# Patient Record
Sex: Female | Born: 1935
Health system: Southern US, Community
[De-identification: ages and names within clinical notes are randomized; demographics above are authoritative.]

## PROBLEM LIST (undated history)

## (undated) DIAGNOSIS — I351 Nonrheumatic aortic (valve) insufficiency: Secondary | ICD-10-CM

## (undated) DIAGNOSIS — I340 Nonrheumatic mitral (valve) insufficiency: Secondary | ICD-10-CM

## (undated) DIAGNOSIS — S129XXA Fracture of neck, unspecified, initial encounter: Secondary | ICD-10-CM

## (undated) DIAGNOSIS — I071 Rheumatic tricuspid insufficiency: Secondary | ICD-10-CM

## (undated) DIAGNOSIS — I10 Essential (primary) hypertension: Secondary | ICD-10-CM

## (undated) DIAGNOSIS — I4891 Unspecified atrial fibrillation: Secondary | ICD-10-CM

## (undated) DIAGNOSIS — R011 Cardiac murmur, unspecified: Secondary | ICD-10-CM

## (undated) DIAGNOSIS — R350 Frequency of micturition: Secondary | ICD-10-CM

## (undated) DIAGNOSIS — I719 Aortic aneurysm of unspecified site, without rupture: Secondary | ICD-10-CM

## (undated) DIAGNOSIS — I509 Heart failure, unspecified: Secondary | ICD-10-CM

## (undated) DIAGNOSIS — M1990 Unspecified osteoarthritis, unspecified site: Secondary | ICD-10-CM

## (undated) DIAGNOSIS — I499 Cardiac arrhythmia, unspecified: Secondary | ICD-10-CM

## (undated) DIAGNOSIS — K219 Gastro-esophageal reflux disease without esophagitis: Secondary | ICD-10-CM

## (undated) DIAGNOSIS — Z973 Presence of spectacles and contact lenses: Secondary | ICD-10-CM

## (undated) DIAGNOSIS — R0602 Shortness of breath: Secondary | ICD-10-CM

## (undated) DIAGNOSIS — D649 Anemia, unspecified: Secondary | ICD-10-CM

## (undated) DIAGNOSIS — Z8701 Personal history of pneumonia (recurrent): Secondary | ICD-10-CM

## (undated) DIAGNOSIS — I2510 Atherosclerotic heart disease of native coronary artery without angina pectoris: Secondary | ICD-10-CM

## (undated) DIAGNOSIS — Z8709 Personal history of other diseases of the respiratory system: Secondary | ICD-10-CM

## (undated) DIAGNOSIS — J449 Chronic obstructive pulmonary disease, unspecified: Secondary | ICD-10-CM

## (undated) DIAGNOSIS — R32 Unspecified urinary incontinence: Secondary | ICD-10-CM

## (undated) DIAGNOSIS — F40240 Claustrophobia: Secondary | ICD-10-CM

## (undated) DIAGNOSIS — S42009A Fracture of unspecified part of unspecified clavicle, initial encounter for closed fracture: Secondary | ICD-10-CM

## (undated) HISTORY — PX: TONSILLECTOMY: SUR1361

## (undated) HISTORY — DX: Heart failure, unspecified: I50.9

## (undated) HISTORY — DX: Nonrheumatic mitral (valve) insufficiency: I34.0

## (undated) HISTORY — DX: Nonrheumatic aortic (valve) insufficiency: I35.1

## (undated) HISTORY — DX: Unspecified atrial fibrillation: I48.91

## (undated) HISTORY — PX: CYSTECTOMY: SUR359

## (undated) HISTORY — DX: Aortic aneurysm of unspecified site, without rupture: I71.9

## (undated) HISTORY — DX: Atherosclerotic heart disease of native coronary artery without angina pectoris: I25.10

## (undated) HISTORY — DX: Rheumatic tricuspid insufficiency: I07.1

## (undated) HISTORY — DX: Essential (primary) hypertension: I10

## (undated) HISTORY — PX: CARDIAC CATHETERIZATION: SHX172

---

## 2000-01-01 ENCOUNTER — Encounter: Admission: RE | Admit: 2000-01-01 | Discharge: 2000-01-01 | Payer: Self-pay | Admitting: Family Medicine

## 2000-01-01 ENCOUNTER — Encounter: Payer: Self-pay | Admitting: Family Medicine

## 2001-02-03 ENCOUNTER — Encounter: Payer: Self-pay | Admitting: Family Medicine

## 2001-02-03 ENCOUNTER — Encounter: Admission: RE | Admit: 2001-02-03 | Discharge: 2001-02-03 | Payer: Self-pay | Admitting: Family Medicine

## 2001-12-22 ENCOUNTER — Ambulatory Visit (HOSPITAL_COMMUNITY): Admission: RE | Admit: 2001-12-22 | Discharge: 2001-12-22 | Payer: Self-pay | Admitting: Gastroenterology

## 2002-02-10 ENCOUNTER — Encounter: Payer: Self-pay | Admitting: Family Medicine

## 2002-02-10 ENCOUNTER — Encounter: Admission: RE | Admit: 2002-02-10 | Discharge: 2002-02-10 | Payer: Self-pay | Admitting: Family Medicine

## 2003-02-12 ENCOUNTER — Encounter: Admission: RE | Admit: 2003-02-12 | Discharge: 2003-02-12 | Payer: Self-pay | Admitting: Family Medicine

## 2003-02-12 ENCOUNTER — Encounter: Payer: Self-pay | Admitting: Family Medicine

## 2004-02-14 ENCOUNTER — Encounter: Admission: RE | Admit: 2004-02-14 | Discharge: 2004-02-14 | Payer: Self-pay | Admitting: Family Medicine

## 2005-02-19 ENCOUNTER — Encounter: Admission: RE | Admit: 2005-02-19 | Discharge: 2005-02-19 | Payer: Self-pay | Admitting: Family Medicine

## 2006-04-29 ENCOUNTER — Encounter: Admission: RE | Admit: 2006-04-29 | Discharge: 2006-04-29 | Payer: Self-pay | Admitting: Family Medicine

## 2006-05-01 ENCOUNTER — Encounter: Payer: Self-pay | Admitting: Internal Medicine

## 2006-05-13 ENCOUNTER — Encounter: Payer: Self-pay | Admitting: Internal Medicine

## 2007-05-16 ENCOUNTER — Encounter: Admission: RE | Admit: 2007-05-16 | Discharge: 2007-05-16 | Payer: Self-pay | Admitting: Family Medicine

## 2007-06-25 ENCOUNTER — Encounter: Admission: RE | Admit: 2007-06-25 | Discharge: 2007-06-25 | Payer: Self-pay | Admitting: Cardiovascular Disease

## 2007-08-12 ENCOUNTER — Ambulatory Visit: Payer: Self-pay | Admitting: Family Medicine

## 2007-11-07 ENCOUNTER — Ambulatory Visit (HOSPITAL_COMMUNITY): Admission: RE | Admit: 2007-11-07 | Discharge: 2007-11-07 | Payer: Self-pay | Admitting: Cardiovascular Disease

## 2008-05-19 ENCOUNTER — Encounter: Admission: RE | Admit: 2008-05-19 | Discharge: 2008-05-19 | Payer: Self-pay | Admitting: Family Medicine

## 2008-09-20 ENCOUNTER — Ambulatory Visit (HOSPITAL_COMMUNITY): Admission: RE | Admit: 2008-09-20 | Discharge: 2008-09-20 | Payer: Self-pay | Admitting: Cardiovascular Disease

## 2008-11-05 ENCOUNTER — Encounter: Payer: Self-pay | Admitting: Internal Medicine

## 2008-11-19 ENCOUNTER — Encounter: Payer: Self-pay | Admitting: Internal Medicine

## 2008-12-03 ENCOUNTER — Ambulatory Visit: Payer: Self-pay | Admitting: Internal Medicine

## 2008-12-15 ENCOUNTER — Encounter: Payer: Self-pay | Admitting: Internal Medicine

## 2009-01-04 ENCOUNTER — Ambulatory Visit: Payer: Self-pay | Admitting: Internal Medicine

## 2009-01-04 ENCOUNTER — Inpatient Hospital Stay (HOSPITAL_COMMUNITY): Admission: AD | Admit: 2009-01-04 | Discharge: 2009-01-09 | Payer: Self-pay | Admitting: Cardiovascular Disease

## 2009-02-02 ENCOUNTER — Encounter: Payer: Self-pay | Admitting: Internal Medicine

## 2009-02-16 ENCOUNTER — Encounter: Payer: Self-pay | Admitting: Internal Medicine

## 2009-02-23 ENCOUNTER — Ambulatory Visit: Payer: Self-pay | Admitting: Internal Medicine

## 2009-03-02 ENCOUNTER — Ambulatory Visit (HOSPITAL_COMMUNITY): Admission: RE | Admit: 2009-03-02 | Discharge: 2009-03-02 | Payer: Self-pay | Admitting: Cardiovascular Disease

## 2009-03-16 ENCOUNTER — Encounter: Payer: Self-pay | Admitting: Internal Medicine

## 2009-03-23 ENCOUNTER — Telehealth (INDEPENDENT_AMBULATORY_CARE_PROVIDER_SITE_OTHER): Payer: Self-pay | Admitting: *Deleted

## 2009-04-04 ENCOUNTER — Ambulatory Visit: Payer: Self-pay | Admitting: Internal Medicine

## 2009-04-11 ENCOUNTER — Encounter: Payer: Self-pay | Admitting: Internal Medicine

## 2009-04-11 ENCOUNTER — Telehealth: Payer: Self-pay | Admitting: Internal Medicine

## 2009-04-28 ENCOUNTER — Ambulatory Visit: Payer: Self-pay | Admitting: Internal Medicine

## 2009-05-16 ENCOUNTER — Encounter: Admission: RE | Admit: 2009-05-16 | Discharge: 2009-05-16 | Payer: Self-pay | Admitting: Family Medicine

## 2009-05-16 ENCOUNTER — Ambulatory Visit: Payer: Self-pay | Admitting: Family Medicine

## 2009-06-14 ENCOUNTER — Encounter: Admission: RE | Admit: 2009-06-14 | Discharge: 2009-06-14 | Payer: Self-pay | Admitting: Family Medicine

## 2009-06-27 ENCOUNTER — Ambulatory Visit: Payer: Self-pay | Admitting: Family Medicine

## 2009-07-19 ENCOUNTER — Ambulatory Visit: Payer: Self-pay | Admitting: Family Medicine

## 2009-07-25 ENCOUNTER — Encounter
Admission: RE | Admit: 2009-07-25 | Discharge: 2009-09-19 | Payer: Self-pay | Source: Home / Self Care | Admitting: Family Medicine

## 2009-12-13 ENCOUNTER — Ambulatory Visit: Payer: Self-pay | Admitting: Family Medicine

## 2010-02-17 ENCOUNTER — Ambulatory Visit: Payer: Self-pay | Admitting: Cardiology

## 2010-03-17 ENCOUNTER — Ambulatory Visit: Payer: Self-pay | Admitting: Cardiovascular Disease

## 2010-04-14 ENCOUNTER — Ambulatory Visit: Payer: Self-pay | Admitting: Cardiovascular Disease

## 2010-05-12 ENCOUNTER — Ambulatory Visit: Payer: Self-pay | Admitting: Cardiovascular Disease

## 2010-06-14 ENCOUNTER — Ambulatory Visit: Payer: Self-pay | Admitting: Cardiovascular Disease

## 2010-06-14 ENCOUNTER — Encounter: Admission: RE | Admit: 2010-06-14 | Discharge: 2010-06-14 | Payer: Self-pay | Admitting: Family Medicine

## 2010-07-03 ENCOUNTER — Ambulatory Visit: Payer: Self-pay | Admitting: Cardiovascular Disease

## 2010-07-20 ENCOUNTER — Ambulatory Visit
Admission: RE | Admit: 2010-07-20 | Discharge: 2010-07-20 | Payer: Self-pay | Source: Home / Self Care | Attending: Family Medicine | Admitting: Family Medicine

## 2010-07-24 ENCOUNTER — Ambulatory Visit
Admission: RE | Admit: 2010-07-24 | Discharge: 2010-07-24 | Payer: Self-pay | Source: Home / Self Care | Attending: Family Medicine | Admitting: Family Medicine

## 2010-08-02 ENCOUNTER — Ambulatory Visit: Payer: Self-pay | Admitting: Cardiology

## 2010-08-08 ENCOUNTER — Ambulatory Visit: Payer: Self-pay | Admitting: Cardiovascular Disease

## 2010-08-31 ENCOUNTER — Encounter (INDEPENDENT_AMBULATORY_CARE_PROVIDER_SITE_OTHER): Payer: Medicare Other

## 2010-08-31 DIAGNOSIS — I4891 Unspecified atrial fibrillation: Secondary | ICD-10-CM

## 2010-08-31 DIAGNOSIS — Z7901 Long term (current) use of anticoagulants: Secondary | ICD-10-CM

## 2010-09-05 ENCOUNTER — Ambulatory Visit (INDEPENDENT_AMBULATORY_CARE_PROVIDER_SITE_OTHER): Payer: Medicare Other | Admitting: Family Medicine

## 2010-09-05 DIAGNOSIS — M25569 Pain in unspecified knee: Secondary | ICD-10-CM

## 2010-09-05 DIAGNOSIS — N760 Acute vaginitis: Secondary | ICD-10-CM

## 2010-09-19 ENCOUNTER — Ambulatory Visit (INDEPENDENT_AMBULATORY_CARE_PROVIDER_SITE_OTHER): Payer: Medicare Other | Admitting: Family Medicine

## 2010-09-19 DIAGNOSIS — M25569 Pain in unspecified knee: Secondary | ICD-10-CM

## 2010-09-19 DIAGNOSIS — N760 Acute vaginitis: Secondary | ICD-10-CM

## 2010-10-03 ENCOUNTER — Telehealth: Payer: Self-pay | Admitting: *Deleted

## 2010-10-03 NOTE — Telephone Encounter (Signed)
Instructed pt to hold Lipitor to see if cramps resolve.   She is coming Friday for a lipid panel and we will re-evaluate at that time.  Pt verbalized an understanding of this.

## 2010-10-03 NOTE — Telephone Encounter (Signed)
I agree

## 2010-10-04 ENCOUNTER — Other Ambulatory Visit: Payer: Self-pay | Admitting: *Deleted

## 2010-10-05 ENCOUNTER — Other Ambulatory Visit: Payer: Medicare Other | Admitting: *Deleted

## 2010-10-05 ENCOUNTER — Other Ambulatory Visit: Payer: Self-pay | Admitting: Cardiovascular Disease

## 2010-10-05 ENCOUNTER — Ambulatory Visit (INDEPENDENT_AMBULATORY_CARE_PROVIDER_SITE_OTHER): Payer: Medicare Other | Admitting: *Deleted

## 2010-10-05 DIAGNOSIS — E7800 Pure hypercholesterolemia, unspecified: Secondary | ICD-10-CM

## 2010-10-05 DIAGNOSIS — I4891 Unspecified atrial fibrillation: Secondary | ICD-10-CM

## 2010-10-05 DIAGNOSIS — Z79899 Other long term (current) drug therapy: Secondary | ICD-10-CM

## 2010-10-06 LAB — COMPREHENSIVE METABOLIC PANEL
Albumin: 4.5 g/dL (ref 3.5–5.2)
CO2: 27 mEq/L (ref 19–32)
Chloride: 101 mEq/L (ref 96–112)
Creat: 0.97 mg/dL (ref 0.40–1.20)
Total Bilirubin: 1 mg/dL (ref 0.3–1.2)
Total Protein: 7.2 g/dL (ref 6.0–8.3)

## 2010-10-06 LAB — LIPID PANEL
Cholesterol: 166 mg/dL (ref 0–200)
LDL Cholesterol: 100 mg/dL — ABNORMAL HIGH (ref 0–99)
Total CHOL/HDL Ratio: 3.1 Ratio
Triglycerides: 67 mg/dL (ref <150)
VLDL: 13 mg/dL (ref 0–40)

## 2010-10-13 ENCOUNTER — Other Ambulatory Visit: Payer: Self-pay | Admitting: *Deleted

## 2010-10-13 NOTE — Telephone Encounter (Signed)
Called pt. To discuss her cholesterol.  She states she does not want to try a new medication at this time.  She requested an appt. To see Dr. Elease Hashimoto to discuss. This was made.

## 2010-10-19 ENCOUNTER — Ambulatory Visit (INDEPENDENT_AMBULATORY_CARE_PROVIDER_SITE_OTHER): Payer: Medicare Other | Admitting: Family Medicine

## 2010-10-19 DIAGNOSIS — N760 Acute vaginitis: Secondary | ICD-10-CM

## 2010-10-23 LAB — CBC
HCT: 44.8 % (ref 36.0–46.0)
MCV: 98.6 fL (ref 78.0–100.0)
RBC: 4.54 MIL/uL (ref 3.87–5.11)
WBC: 8.7 10*3/uL (ref 4.0–10.5)

## 2010-10-23 LAB — COMPREHENSIVE METABOLIC PANEL
BUN: 18 mg/dL (ref 6–23)
GFR calc non Af Amer: 51 mL/min — ABNORMAL LOW (ref >60)
Glucose, Bld: 113 mg/dL — ABNORMAL HIGH (ref 70–99)
Total Bilirubin: 1.3 mg/dL — ABNORMAL HIGH (ref 0.3–1.2)
Total Protein: 6.9 g/dL (ref 6.0–8.3)

## 2010-10-23 LAB — APTT: aPTT: 47 seconds — ABNORMAL HIGH (ref 24–37)

## 2010-10-23 LAB — PROTIME-INR
INR: 2.5 — ABNORMAL HIGH (ref 0.00–1.49)
Prothrombin Time: 28 seconds — ABNORMAL HIGH (ref 11.6–15.2)

## 2010-11-02 ENCOUNTER — Ambulatory Visit (INDEPENDENT_AMBULATORY_CARE_PROVIDER_SITE_OTHER): Payer: Medicare Other | Admitting: *Deleted

## 2010-11-02 DIAGNOSIS — I4891 Unspecified atrial fibrillation: Secondary | ICD-10-CM

## 2010-11-02 LAB — POCT INR: INR: 2

## 2010-11-08 ENCOUNTER — Encounter: Payer: Self-pay | Admitting: Cardiovascular Disease

## 2010-11-08 DIAGNOSIS — I340 Nonrheumatic mitral (valve) insufficiency: Secondary | ICD-10-CM | POA: Insufficient documentation

## 2010-11-08 DIAGNOSIS — I10 Essential (primary) hypertension: Secondary | ICD-10-CM | POA: Insufficient documentation

## 2010-11-08 DIAGNOSIS — I4891 Unspecified atrial fibrillation: Secondary | ICD-10-CM | POA: Insufficient documentation

## 2010-11-08 DIAGNOSIS — I351 Nonrheumatic aortic (valve) insufficiency: Secondary | ICD-10-CM | POA: Insufficient documentation

## 2010-11-08 DIAGNOSIS — I071 Rheumatic tricuspid insufficiency: Secondary | ICD-10-CM | POA: Insufficient documentation

## 2010-11-15 ENCOUNTER — Encounter: Payer: Self-pay | Admitting: Cardiovascular Disease

## 2010-11-15 ENCOUNTER — Ambulatory Visit (INDEPENDENT_AMBULATORY_CARE_PROVIDER_SITE_OTHER): Payer: Medicare Other | Admitting: Cardiovascular Disease

## 2010-11-15 VITALS — BP 138/78 | HR 58 | Ht 64.0 in | Wt 147.0 lb

## 2010-11-15 DIAGNOSIS — I4891 Unspecified atrial fibrillation: Secondary | ICD-10-CM

## 2010-11-15 DIAGNOSIS — E785 Hyperlipidemia, unspecified: Secondary | ICD-10-CM

## 2010-11-15 MED ORDER — DIGOXIN 125 MCG PO TABS: 125.0000 ug | ORAL_TABLET | Freq: Every day | ORAL | Status: DC

## 2010-11-15 MED ORDER — WARFARIN SODIUM 5 MG PO TABS: 5.0000 mg | ORAL_TABLET | Freq: Every day | ORAL | Status: DC

## 2010-11-15 MED ORDER — PRAZOSIN HCL 2 MG PO CAPS: 2.0000 mg | ORAL_CAPSULE | Freq: Two times a day (BID) | ORAL | Status: DC

## 2010-11-15 NOTE — Progress Notes (Signed)
[name] [name] Date of Birth  1931-08-03 [facility] / [facility] [address]     [address] [place], [place]  [address] [phone]  Fax  [phone]  History of Present Illness:  Complains of dyspnea with any exercise.  Gets out of breath with most activities.  Also is limited by her knee pain. She denies having any chest pain. We performed a Lexiscan Myoview study on her in 2010 which was negative for ischemia. She had normal left ventricular systolic function with an ejection fraction of 62%.  Current Outpatient Prescriptions on File Prior to Visit  Medication Sig Dispense Refill  . acetaminophen (TYLENOL) 325 MG tablet Take 650 mg by mouth every 6 (six) hours as needed.        . digoxin (LANOXIN) 0.125 MG tablet Take 125 mcg by mouth daily.        . hydrochlorothiazide 25 MG tablet Take 25 mg by mouth daily.        . potassium chloride (MICRO-K) 10 MEQ CR capsule Take 10 mEq by mouth daily.        . prazosin (MINIPRESS) 2 MG capsule Take 2 mg by mouth 2 (two) times daily.        . propranolol (INDERAL LA) 160 MG SR capsule Take 160 mg by mouth daily.        Marland Kitchen warfarin (COUMADIN) 5 MG tablet Take 5 mg by mouth daily. AS DIRECTED       . DISCONTD: atorvastatin (LIPITOR) 20 MG tablet Take 10 mg by mouth daily.          Allergies  Allergen Reactions  . Atorvastatin     Muscle aches  . Amiodarone Hcl     REACTION: Nausea/Vomiting; decrease appetite; affects liver enzymes  . Aspirin   . Diltiazem Hcl     REACTION: Nausea/vomitting  . Flecainide Acetate     REACTION: Intol: nausea/horrible feeling  . Metoprolol Succinate     REACTION: Nausea/vomiting;dizziness  . Sotalol Hcl     REACTION: Nausea/vomiting    Past Medical History  Diagnosis Date  . AF (atrial fibrillation)   . Hypertension   . Aortic insufficiency   . Mitral regurgitation   . Tricuspid regurgitation     No past surgical history on file.  History  Smoking status  .  Former Smoker  . Quit date: 11/08/1995  Smokeless tobacco  . Not on file    History  Alcohol Use No    Family History  Problem Relation Age of Onset  . Aneurysm Mother 27  . Hodgkin's lymphoma Father 24    Reviw of Systems:  Reviewed in the HPI.  All other systems are negative.  Physical Exam: BP 138/78  Pulse 58  Ht 5\' 4"  (1.626 m)  Wt 147 lb (66.679 kg)  BMI 25.23 kg/m2 The patient is alert and oriented x 3.  The mood and affect are normal.  The skin is warm and dry.  Color is normal.  The HEENT exam reveals that the sclera are nonicteric.  The mucous membranes are moist.  The carotids are 2+ without bruits.  There is no thyromegaly.  There is no JVD.  The lungs are clear.  The chest wall is non tender.  The heart exam reveals an irregular rate with a normal S1 and S2.  There are no murmurs, gallops, or rubs.  The PMI is not displaced.   Abdominal exam reveals good bowel sounds.  There is no  guarding or rebound.  There is no hepatosplenomegaly or tenderness.  There are no masses.  Exam of the legs reveal no clubbing, cyanosis, or edema.  The legs are without rashes.  The distal pulses are intact.  Cranial nerves II - XII are intact.  Motor and sensory functions are intact.  The gait is normal.  Assessment / Plan:

## 2010-11-15 NOTE — Assessment & Plan Note (Signed)
[name] [name] is doing fairly well from a cardiac standpoint. I think that she is deconditioned. She has normal left ventricular systolic function. We'll continue with her same Coumadin dosing. Her INR levels have been therapeutic. I'll see her again in 6 months.

## 2010-11-15 NOTE — Assessment & Plan Note (Signed)
Pt does not want to take Pravachol.  She is concerned about side effects.  Had a bad reaction to Lipator.  Will continue to follow.

## 2010-11-28 NOTE — H&P (Signed)
NAME:  [name] [name], [name] [name] NO.:  [id number]   MEDICAL RECORD NO.:  [id number]          PATIENT TYPE:  INP   LOCATION:                               FACILITY:  [facility]   PHYSICIAN:  Vesta Mixer, M.D. DATE OF BIRTH:  1932/01/11   DATE OF ADMISSION:  DATE OF DISCHARGE:                              HISTORY & PHYSICAL   [name] [name] is an elderly female with a history of aortic insufficiency,  mitral regurgitation, and intermittent atrial fibrillation.  She is now  admitted for initiation of sotalol therapy.   [name] [name] is an elderly female with a history of atrial fibrillation.  We  have tried cardioversion several times.  Her last cardioversion only  lasted a week or so.  We discussed putting her on various  antiarrhythmics.  Unfortunately, her insurance company will only pay for  amiodarone, sotalol, and disopyramide.  We are now admitting her to the  hospital for initiation of sotalol therapy.   She has had a negative stress Cardiolite study in 2007.  She had no  evidence of ischemia, and she had normal left ventricular systolic  function.  Her echocardiogram from 2007 also revealed normal left  ventricular systolic function.  She has mild left ventricular  hypertrophy.  There is mild aortic stenosis with moderate aortic  insufficiency.  She has mild mitral regurgitation and moderate tricuspid  regurgitation.  Her PA pressures were estimated at 34.   [name] [name] has continued to have lots of shortness of breath and generalized  fatigue.  She complains of some leg swelling.  She has not had any  episodes of chest pain.  She denies any syncope or presyncope.  The  shortness of breath seems to be clearly worsened by her atrial  fibrillation.  She was clearly better for the week that she was in  normal sinus rhythm.   She denies any heat or cold intolerance.  She denies any weight gain or  weight loss.  She denies any blood in her urine or blood in her stool.  She has been  tolerating the Coumadin quite well.  She denies any visual  problems.  She denies any skin problems.  She is quite tender from her  exposure to the son.   She denies any change in bowel habits.  She denies any weight gain or  weight loss.   CURRENT MEDICATIONS:  1. Inderal LA 160 mg a day.  2. Hydrochlorothiazide 25 mg a day.  3. Potassium chloride 10 mEq a day.  4. Prazosin 2 mg twice a day.  5. Coumadin 5 mg 3 days a week with 2.5 mg 4 days a week.   She is intolerant to ASPIRIN which caused peptic ulcer disease and  bleeding.   PAST MEDICAL HISTORY:  1. History of peptic ulcer disease.  2. Atrial fibrillation.  3. Moderate aortic insufficiency.  4. Mitral regurgitation.  5. Tricuspid regurgitation.   SOCIAL HISTORY:  The patient is a nonsmoker.  She is retired.   FAMILY HISTORY:  Her father died in his 67s due to Hodgkin disease.  Her  mother lived to be 58 years old died of an aneurysm.   Her review of systems is reviewed in the HPI.  All other systems were  reviewed and are negative.   PHYSICAL EXAMINATION:  GENERAL:  She is an elderly female in no acute  distress.  She is alert and oriented x3, and her mood and affect are  normal.  VITAL SIGNS:  Her weight is 157 which is down 5 pounds.  Blood pressure  is 90/60.  Her heart rate is 106.  NECK:  Supple.  Her carotids are 2+ without bruits.  There is no JVD.  HEENT:  Her sclerae are nonicteric.  Her mucous membranes are moist.  LUNGS:  Clear.  BACK:  Nontender.  HEART:  Irregularly irregular.  She has a 2/6 systolic ejection murmur  at the left sternal border.  Her PMI is nondisplaced.  ABDOMEN:  Good bowel sounds and is nontender.  There is no guarding or  rebound.  There is no hepatosplenomegaly.  EXTREMITIES:  She is quite tan.  There is no palpable cords.  There is  no clubbing, cyanosis, or edema.  NEUROLOGIC:  Cranial nerves II through XII are intact, and motor and  sensory function are intact.  Her gait is  normal.   Laboratory data is pending.   [name] [name] is now admitted to the hospital for initiation of sotalol therapy.  We have discussed the risks, benefits, and options of sotalol  initiation.  We will anticipate doing a cardioversion on Friday.      Vesta Mixer, M.D.  Electronically Signed     PJN/MEDQ  D:  01/04/2009  T:  01/04/2009  Job:  [id number]   cc:   Sharlot Gowda, M.D.

## 2010-11-28 NOTE — H&P (Signed)
NAME:  [name] [name], [name] [name] NO.:  [id number]   MEDICAL RECORD NO.:  [id number]           PATIENT TYPE:  OIB   LOCATION:                               FACILITY:  [facility]   PHYSICIAN:  Vesta Mixer, M.D. DATE OF BIRTH:  04-Dec-1931   DATE OF ADMISSION:  09/20/2008  DATE OF DISCHARGE:                              HISTORY & PHYSICAL   [name] [name] is an elderly female with a history of moderate aortic  insufficiency, mitral regurgitation, and tricuspid regurgitation.  She  has a history of atrial fibrillation and is now admitted for elective  cardioversion.   [name] has been feeling fairly well.  She has been doing all of her  normal activities, but several days ago developed some worsening  shortness of breath.  She denies any chest pain.  She has noted some  palpitations and knew that she had gone back into atrial fibrillation.  We cardioverted her several years ago and she stayed in sinus rhythm for  quite a long time.  She has been on Coumadin and her protimes have been  therapeutic.  She has not had any episodes of syncope or presyncope.  She denies any chest pain.  She denies any PND or orthopnea.   She denies any heat or cold intolerance, weight gain, or weight loss.  She denies any blood in her urine or blood in her stool.   CURRENT MEDICATIONS:  1. Inderal LA 160 mg a day.  2. Hydrochlorothiazide 25 mg a day.  3. Potassium chloride 10 mEq a day.  4. Prazosin 2 mg twice a day.  5. Coumadin 5 mg 3 days a week with 2.5 mg 4 days a week.  6. Propranolol as needed.   ALLERGIES:  She is intolerant to aspirin, which has caused GI bleed in  the past.   PAST MEDICAL HISTORY:  1. Atrial fibrillation.  2. Moderate aortic insufficiency.  3. Hypertension.  4. Mitral regurgitation.   SOCIAL HISTORY:  The patient used to smoke but quit over 15 years ago.   FAMILY HISTORY:  Her father died in his 47s due to Hodgkin's disease.  Her mother lived to be 13 years old and  died of an aneurysm.   REVIEW OF SYSTEMS:  Reviewed in HPI.  All other systems were negative.   PHYSICAL EXAMINATION:  GENERAL:  She is an elderly female in no acute  distress.  She is alert and oriented x3.  Her mood and affect are  normal.  VITAL SIGNS:  Her weight is 157, blood pressure is 108/70 with a heart  rate of 114.  HEENT:  She is normocephalic and atraumatic.  NECK:  Supple.  Her carotids are 2+ without bruits.  There is no JVD, no  thyromegaly.  LUNGS:  Clear.  HEART:  Irregularly irregular and somewhat tachycardiac.  Her PMI is  nondisplaced.  ABDOMEN:  Good bowel sounds.  There is no hepatosplenomegaly.  There are  no masses or bruits.  SKIN:  Warm and dry.  There are no rashes.  EXTREMITIES:  She has no clubbing, cyanosis,  or edema.  NEUROLOGIC:  Nonfocal.  Cranial nerves II through XII are intact and  motor and sensory function intact.  Her gait is normal.   Her EKG reveals atrial fibrillation with a rapid ventricular response.  She has some nonspecific ST and T-wave changes.   [name] [name] presents with recurrent atrial fibrillation.  She is  therapeutically on her Coumadin.  We will draw labs.  We will anticipate  setting her up for cardioversion later this week.  We have discussed the  risks, benefits, and options of cardioversion.  She understands and  agrees to proceed.      Vesta Mixer, M.D.  Electronically Signed     PJN/MEDQ  D:  09/14/2008  T:  09/15/2008  Job:  [id number]   cc:   Sharlot Gowda, M.D.

## 2010-11-28 NOTE — Discharge Summary (Signed)
[name], [name] [name]                ACCOUNT NO.:  [id number]   MEDICAL RECORD NO.:  [id number]          PATIENT TYPE:  INP   LOCATION:  2001                         FACILITY:  [facility]   PHYSICIAN:  Vesta Mixer, M.D. DATE OF BIRTH:  1931-09-13   DATE OF ADMISSION:  01/04/2009  DATE OF DISCHARGE:  01/09/2009                               DISCHARGE SUMMARY   DISCHARGE DIAGNOSES:  1. Atrial flutter with anterior arrhythmic drug loading.  2. Hypertension.  3. Chronic Coumadin therapy.   DISCHARGE MEDICATIONS:  1. Inderal LA 160 mg a day.  2. Hydrochlorothiazide 25 mg a day.  3. Potassium chloride 10 mEq a day.  4. Prazosin 2 mg twice a day.  5. Coumadin 5 mg 3 days a week, with 2.5 mg 4 days a week.  6. Tylenol as needed.  7. Ibuprofen as needed.  8. Propranolol 10 mg as needed.  9. Flecainide 100 mg twice a day.   DISPOSITION:  The patient will see Dr. Elease Hashimoto this week for a stress  Cardiolite study.  She will follow up in a week or so later for  cardioversion.   HISTORY OF PRESENT ILLNESS:  [name] [name] is an elderly female with the history  of aortic insufficiency, mitral regurgitation, and atrial fibrillation.  She was admitted for antiarrhythmic drug therapy loading.   Please see dictated H and P for further details.   HOSPITAL COURSE:  1. Atrial fibrillation.  The patient was admitted and was initially      started on sotalol.  Unfortunately, she did not tolerate that      medication.  We changed her to flecainide.  She tolerated this      medication much better.  Her QTC is slightly longer than admission.      Her QTC corrected is 497 milliseconds.  The patient tolerated the      medication quite well.  We will bring her back for a stress test      this week.  We will anticipate doing cardioversion in a week or so.  2. Hypertension.  The patient's blood pressure remains elevated.  She      thinks that it may be due to the schedule and lack of rest in the      hospital.  We  will discharge her on the above-noted medications and      we will follow this up in the office.  All of her other medical      problems are stable.      Vesta Mixer, M.D.  Electronically Signed     PJN/MEDQ  D:  01/09/2009  T:  01/09/2009  Job:  [id number]

## 2010-11-30 ENCOUNTER — Ambulatory Visit (INDEPENDENT_AMBULATORY_CARE_PROVIDER_SITE_OTHER): Payer: Medicare Other | Admitting: *Deleted

## 2010-11-30 DIAGNOSIS — I4891 Unspecified atrial fibrillation: Secondary | ICD-10-CM

## 2010-12-01 NOTE — Procedures (Signed)
[facility]  Patient:    [name] [name], [name] [name] Visit Number: [id number] MRN: [id number]          Service Type: END Location: ENDO Attending Physician:  Nelda Marseille Dictated by:   Petra Kuba, M.D. Proc. Date: 12/22/01 Admit Date:  12/22/2001 Discharge Date: 12/22/2001   CC:         Ronnald Nian, M.D.  Alvia Grove., M.D.   Procedure Report  PROCEDURE:  Esophagogastroduodenoscopy with biopsy.  INDICATIONS FOR PROCEDURE:  Upper GI bleeding.  Consent was signed after risks, benefits, methods, and options were thoroughly discussed in the office.  MEDICINES USED:  Demerol 40, Versed 4.  DESCRIPTION OF PROCEDURE:  The scope was inserted by direct vision. The esophagus was normal. There was a small hiatal hernia. The scope passed into the stomach, no signs of bleeding were seen, and advanced through an antrum pertinent for some antritis and a deformed pylorus into the duodenal bulb where one active DU was seen with a flat white base and also a scar from previous ulcer disease. As we advanced around the C loop, there was a fibrous stricture probably from previous ulcer disease and with minimal trauma we were able to advance around to a normal second portion of the duodenum. The scope was withdrawn back to the bulb, the ulcer was washed and watched and could not be made to bleed. The scope was reinserted around the C loop. Again there was minimal trauma to the stricture but no active bleeding. The scope was withdrawn back to the stomach and retroflexed. The angularis, cardia, fundus, lesser and greater curve were all normal except for some minimal gastritis and the hiatal hernia being confirmed in the cardia. The scope was straightened and straight visualization of the stomach was normal except for above. We advanced to the antrum and one biopsy of the antrum for the CLOtest was obtained to rule out Helicobacter. The scope was reinserted  into the duodenal bulb, again the ulcer was washed and watched and could not be made to bleed. No active bleeding was seen. The scope was then slowly withdrawn after air and water were suctioned again confirming a normal esophagus. The scope was removed. The patient tolerated the procedure well and there was no obvious or immediate complication.  ENDOSCOPIC DIAGNOSES: 1. Small hiatal hernia. 2. Antritis and deformed pylorus status post CLOtest biopsy to rule out    Helicobacter. 3. One active duodenal ulcer with a flat wide base, an old scar from previous    ulcer. 4. C loop stricture from probable previous ulcer disease with minimal trauma    from the scope. 5. Otherwise normal EGD.  PLAN:  No aspirin or nonsteroidals for long-term. If her arthritis increases at some point in the future, might consider COX inhibitors. Go ahead and recheck a CBC in one week and treat her with an MVI plus iron in the meantime. Have her call p.r.n. signs of bleeding, weakness, dizziness, abdominal pain, shortness of breath. Will put her on Prilosec twice a day for a week and then decrease her to once a day and will treat Helicobacter if positive and as long as she does well medically, see her back in 1-2 months to recheck guaiac, CBC, symptoms and make sure no further workup plans are needed. At that juncture if she is doing well, considerations of colon screening just to be sure. Dictated by:   Petra Kuba, M.D. Attending Physician:  Nelda Marseille  DD:  12/22/01 TD:  12/24/01 Job: [id number]

## 2010-12-01 NOTE — Consult Note (Signed)
[facility]  Patient:    [name] [name], [name] [name] Visit Number: [id number] MRN: [id number]          Service Type: END Location: ENDO Attending Physician:  Nelda Marseille Dictated by:   Petra Kuba, M.D. Proc. Date: 12/22/01 Admit Date:  12/22/2001 Discharge Date: 12/22/2001   CC:         Ronnald Nian, M.D.  Alvia Grove., M.D.   Consultation Report  HISTORY:  A patient with an essentially negative GI history seen at the request of Dr. Susann Givens for an upper GI bleed which occurred on Saturday and then on Sunday she had black diarrhea with some bright red blood. Felt weak and dizzy on Saturday. He is not sure why she threw up. These symptoms came on after a normal dinner and after she was lying down. She had been on a fair amount of aspirin and nonsteroidals for an old neck injury. She has not had any previous GI workup or tests, had had guaiac cards periodically with a physical and supposedly they were fine.  PAST MEDICAL HISTORY:  Pertinent for the neck problem as well as high blood pressure, pilonidal cyst and leaky bowel she tells me followed by Dr. Elease Hashimoto.  CURRENT MEDICATIONS: 1. Inderal. 2. Minipress. 3. HCTZ. 4. Over the counter aspirin and nonsteroidals. 5. Potassium. 6. Miclalcin.  SOCIAL HISTORY:  Does not smoke but will use drink before dinner.  ALLERGIES:  None.  FAMILY HISTORY:  Negative for any GI problem though her dad had Hodgkins disease and the mother had breast cancer.  REVIEW OF SYSTEMS:  Pertinent for no swallowing problems, significant weight loss or any complaints. She is no longer weak or dizzy like she was on Saturday night after she threw up.  PHYSICAL EXAMINATION:  VITAL SIGNS:  See the chart. She was not orthostatic in Dr. Levonne Spiller office.  HEENT:  Nonicteric.  NECK:  Supple without adenopathy.  LUNGS:  Clear.  HEART:  Regular rate and rhythm.  ABDOMEN:  Soft, nontender.  RECTAL:   Pertinent for having melena per Dr. Susann Givens.  LABORATORY DATA:  Pending at time of dictation.  ASSESSMENT:  Upper GI bleed probably ulcerative due to aspirin and nonsteroidals.  PLAN:  The risks, benefits, and methods of endoscopy were discussed and will proceed this afternoon with further workup and plans pending those findings. We did discuss holding her aspirin and nonsteroidals. As to probable cause, please see endoscopy dictation for other workup and plans. Dictated by:   Petra Kuba, M.D. Attending Physician:  Nelda Marseille DD:  12/22/01 TD:  12/24/01 Job: [id number]

## 2010-12-24 ENCOUNTER — Other Ambulatory Visit: Payer: Self-pay | Admitting: Cardiovascular Disease

## 2010-12-25 ENCOUNTER — Other Ambulatory Visit: Payer: Self-pay | Admitting: *Deleted

## 2010-12-25 NOTE — Telephone Encounter (Signed)
Fax received from pharmacy.has one years worth of refills, pharmacy auto sent script. Alfonso Ramus RN

## 2010-12-28 ENCOUNTER — Ambulatory Visit (INDEPENDENT_AMBULATORY_CARE_PROVIDER_SITE_OTHER): Payer: Medicare Other | Admitting: *Deleted

## 2010-12-28 DIAGNOSIS — I4891 Unspecified atrial fibrillation: Secondary | ICD-10-CM

## 2010-12-28 LAB — POCT INR: INR: 2.6

## 2011-01-25 ENCOUNTER — Ambulatory Visit (INDEPENDENT_AMBULATORY_CARE_PROVIDER_SITE_OTHER): Payer: Medicare Other | Admitting: *Deleted

## 2011-01-25 DIAGNOSIS — I4891 Unspecified atrial fibrillation: Secondary | ICD-10-CM

## 2011-01-25 LAB — POCT INR: INR: 3

## 2011-02-03 ENCOUNTER — Other Ambulatory Visit: Payer: Self-pay | Admitting: Cardiovascular Disease

## 2011-02-05 ENCOUNTER — Other Ambulatory Visit: Payer: Self-pay | Admitting: *Deleted

## 2011-02-05 DIAGNOSIS — E785 Hyperlipidemia, unspecified: Secondary | ICD-10-CM

## 2011-02-05 MED ORDER — PRAZOSIN HCL 2 MG PO CAPS: 2.0000 mg | ORAL_CAPSULE | Freq: Two times a day (BID) | ORAL | Status: DC

## 2011-02-05 NOTE — Telephone Encounter (Signed)
90 day supply given

## 2011-02-22 ENCOUNTER — Ambulatory Visit (INDEPENDENT_AMBULATORY_CARE_PROVIDER_SITE_OTHER): Payer: Medicare Other | Admitting: *Deleted

## 2011-02-22 DIAGNOSIS — I4891 Unspecified atrial fibrillation: Secondary | ICD-10-CM

## 2011-03-20 ENCOUNTER — Ambulatory Visit (INDEPENDENT_AMBULATORY_CARE_PROVIDER_SITE_OTHER): Payer: Medicare Other | Admitting: *Deleted

## 2011-03-20 DIAGNOSIS — I4891 Unspecified atrial fibrillation: Secondary | ICD-10-CM

## 2011-04-10 LAB — PROTIME-INR: Prothrombin Time: 28.7 — ABNORMAL HIGH

## 2011-04-17 ENCOUNTER — Ambulatory Visit (INDEPENDENT_AMBULATORY_CARE_PROVIDER_SITE_OTHER): Payer: Medicare Other | Admitting: *Deleted

## 2011-04-17 DIAGNOSIS — I4891 Unspecified atrial fibrillation: Secondary | ICD-10-CM

## 2011-04-17 LAB — POCT INR: INR: 1.8

## 2011-05-10 ENCOUNTER — Ambulatory Visit (INDEPENDENT_AMBULATORY_CARE_PROVIDER_SITE_OTHER): Payer: Medicare Other | Admitting: *Deleted

## 2011-05-10 DIAGNOSIS — I4891 Unspecified atrial fibrillation: Secondary | ICD-10-CM

## 2011-05-11 ENCOUNTER — Encounter: Payer: Medicare Other | Admitting: *Deleted

## 2011-05-15 ENCOUNTER — Encounter: Payer: Medicare Other | Admitting: *Deleted

## 2011-05-18 ENCOUNTER — Other Ambulatory Visit: Payer: Self-pay | Admitting: Family Medicine

## 2011-05-18 DIAGNOSIS — Z1231 Encounter for screening mammogram for malignant neoplasm of breast: Secondary | ICD-10-CM

## 2011-05-28 ENCOUNTER — Encounter: Payer: Self-pay | Admitting: Cardiovascular Disease

## 2011-05-28 ENCOUNTER — Ambulatory Visit (INDEPENDENT_AMBULATORY_CARE_PROVIDER_SITE_OTHER): Payer: Medicare Other | Admitting: Cardiovascular Disease

## 2011-05-28 VITALS — BP 144/88 | HR 68 | Ht 64.0 in | Wt 146.4 lb

## 2011-05-28 DIAGNOSIS — I4891 Unspecified atrial fibrillation: Secondary | ICD-10-CM

## 2011-05-28 DIAGNOSIS — I10 Essential (primary) hypertension: Secondary | ICD-10-CM

## 2011-05-28 MED ORDER — PROPRANOLOL HCL 10 MG PO TABS: 10.0000 mg | ORAL_TABLET | Freq: Four times a day (QID) | ORAL | Status: DC

## 2011-05-28 MED ORDER — HYDROCHLOROTHIAZIDE 25 MG PO TABS: 25.0000 mg | ORAL_TABLET | Freq: Every day | ORAL | Status: DC

## 2011-05-28 MED ORDER — PROPRANOLOL HCL ER 160 MG PO CP24: 160.0000 mg | ORAL_CAPSULE | Freq: Every day | ORAL | Status: DC

## 2011-05-28 MED ORDER — POTASSIUM CHLORIDE ER 10 MEQ PO CPCR: 10.0000 meq | ORAL_CAPSULE | Freq: Every day | ORAL | Status: DC

## 2011-05-28 NOTE — Assessment & Plan Note (Signed)
She remains in fibrillation. We've tried numerous medications.  Her insurance we will not cover Tikosyn. We'll continue with her current medications.

## 2011-05-28 NOTE — Patient Instructions (Addendum)
Your physician wants you to follow-up in: 6 months You will receive a reminder letter in the mail two months in advance. If you don't receive a letter, please call our office to schedule the follow-up appointment.  Your physician has recommended you make the following change in your medication:   1) start using propranolol 10 mg 4 times a day instead of increasing propranolol LA

## 2011-05-28 NOTE — Progress Notes (Signed)
[name] [name] Date of Birth  1932/04/16 [facility] [address]    [address] [place], [place]  [address] [phone]  Fax  [phone]  History of Present Illness:  [name] [name] is a 77 y.o. female with a hx of chronic A-Fib.  She has been cardioverted several times.  She had tried Rhythmol, Flecaininde, sotolol, and amiodarone.   We have discussed Tikosyn but her insurance company will not cover it.  She has had some right knee problems. She also had a dental extraction and had some bleeding.  Current Outpatient Prescriptions on File Prior to Visit  Medication Sig Dispense Refill  . digoxin (LANOXIN) 0.125 MG tablet Take 1 tablet (125 mcg total) by mouth daily.  90 tablet  3  . hydrochlorothiazide 25 MG tablet Take 25 mg by mouth daily.        . potassium chloride (MICRO-K) 10 MEQ CR capsule Take 10 mEq by mouth daily.        . prazosin (MINIPRESS) 2 MG capsule Take 1 capsule (2 mg total) by mouth 2 (two) times daily.  180 capsule  3  . propranolol (INDERAL LA) 160 MG SR capsule Take 160 mg by mouth daily.        Marland Kitchen warfarin (COUMADIN) 5 MG tablet Take 1 tablet (5 mg total) by mouth daily. AS DIRECTED  90 tablet  3    Allergies  Allergen Reactions  . Atorvastatin     Muscle aches  . Amiodarone Hcl     REACTION: Nausea/Vomiting; decrease appetite; affects liver enzymes  . Aspirin   . Diltiazem Hcl     REACTION: Nausea/vomitting  . Flecainide Acetate     REACTION: Intol: nausea/horrible feeling  . Metoprolol Succinate     REACTION: Nausea/vomiting;dizziness  . Sotalol Hcl     REACTION: Nausea/vomiting    Past Medical History  Diagnosis Date  . AF (atrial fibrillation)   . Hypertension   . Aortic insufficiency   . Mitral regurgitation   . Tricuspid regurgitation     No past surgical history on file.  History  Smoking status  . Former Smoker  . Quit date: 11/08/1995  Smokeless tobacco  . Not on file    History  Alcohol Use No    Family History  Problem  Relation Age of Onset  . Aneurysm Mother 70  . Hodgkin's lymphoma Father 64    Reviw of Systems:  Reviewed in the HPI.  All other systems are negative.  Physical Exam: BP 144/88  Pulse 68  Ht 5\' 4"  (1.626 m)  Wt 146 lb 6.4 oz (66.407 kg)  BMI 25.13 kg/m2 The patient is alert and oriented x 3.  The mood and affect are normal.   Skin: warm and dry.  Color is normal.    HEENT:   [place]/ AT, no JVD, normal carotids  Lungs: clear   Heart: Irregularly Irregularly, no murmurs    Abdomen: nontender  Extremities:  No c/c/e  Neuro:  Non focal     ECG: A-Fib, controlled ventricular response  Assessment / Plan:

## 2011-06-14 ENCOUNTER — Ambulatory Visit (INDEPENDENT_AMBULATORY_CARE_PROVIDER_SITE_OTHER): Payer: Medicare Other | Admitting: *Deleted

## 2011-06-14 DIAGNOSIS — I4891 Unspecified atrial fibrillation: Secondary | ICD-10-CM

## 2011-06-14 LAB — POCT INR: INR: 2.1

## 2011-06-19 ENCOUNTER — Ambulatory Visit
Admission: RE | Admit: 2011-06-19 | Discharge: 2011-06-19 | Disposition: A | Discharge status: Home / Self Care | Payer: Medicare Other | Source: Ambulatory Visit | Attending: Family Medicine | Admitting: Family Medicine

## 2011-06-19 DIAGNOSIS — Z1231 Encounter for screening mammogram for malignant neoplasm of breast: Secondary | ICD-10-CM

## 2011-07-06 ENCOUNTER — Ambulatory Visit (INDEPENDENT_AMBULATORY_CARE_PROVIDER_SITE_OTHER): Payer: Medicare Other | Admitting: *Deleted

## 2011-07-06 DIAGNOSIS — I4891 Unspecified atrial fibrillation: Secondary | ICD-10-CM

## 2011-07-06 LAB — POCT INR: INR: 2.3

## 2011-07-23 ENCOUNTER — Telehealth: Payer: Self-pay | Admitting: Cardiovascular Disease

## 2011-07-23 DIAGNOSIS — I10 Essential (primary) hypertension: Secondary | ICD-10-CM

## 2011-07-23 MED ORDER — PROPRANOLOL HCL 10 MG PO TABS: 10.0000 mg | ORAL_TABLET | Freq: Four times a day (QID) | ORAL | Status: DC

## 2011-07-23 MED ORDER — HYDROCHLOROTHIAZIDE 25 MG PO TABS: 25.0000 mg | ORAL_TABLET | Freq: Every day | ORAL | Status: DC

## 2011-07-23 MED ORDER — PROPRANOLOL HCL ER 160 MG PO CP24: 160.0000 mg | ORAL_CAPSULE | Freq: Every day | ORAL | Status: DC

## 2011-07-23 NOTE — Telephone Encounter (Signed)
Propanolol 160 mg, hctz 25 mg, needs refill to [facility] for 90 days supply

## 2011-07-25 ENCOUNTER — Other Ambulatory Visit: Payer: Self-pay | Admitting: Cardiovascular Disease

## 2011-07-25 DIAGNOSIS — I10 Essential (primary) hypertension: Secondary | ICD-10-CM

## 2011-07-25 MED ORDER — PROPRANOLOL HCL ER 160 MG PO CP24: 160.0000 mg | ORAL_CAPSULE | Freq: Every day | ORAL | Status: DC

## 2011-07-25 NOTE — Telephone Encounter (Signed)
New problem:    90 days supply  

## 2011-07-25 NOTE — Telephone Encounter (Signed)
Fax Received. Refill Completed. [name] [name] (R.M.A)   

## 2011-08-03 ENCOUNTER — Encounter: Payer: Medicare Other | Admitting: *Deleted

## 2011-08-06 ENCOUNTER — Ambulatory Visit (INDEPENDENT_AMBULATORY_CARE_PROVIDER_SITE_OTHER): Payer: Medicare Other | Admitting: *Deleted

## 2011-08-06 DIAGNOSIS — I4891 Unspecified atrial fibrillation: Secondary | ICD-10-CM | POA: Diagnosis not present

## 2011-08-06 LAB — POCT INR: INR: 2.4

## 2011-09-18 ENCOUNTER — Ambulatory Visit (INDEPENDENT_AMBULATORY_CARE_PROVIDER_SITE_OTHER): Payer: Medicare Other

## 2011-09-18 DIAGNOSIS — I4891 Unspecified atrial fibrillation: Secondary | ICD-10-CM

## 2011-10-16 ENCOUNTER — Ambulatory Visit (INDEPENDENT_AMBULATORY_CARE_PROVIDER_SITE_OTHER): Payer: Medicare Other | Admitting: Family Medicine

## 2011-10-16 ENCOUNTER — Encounter: Payer: Self-pay | Admitting: Family Medicine

## 2011-10-16 VITALS — BP 112/70 | HR 56 | Wt 143.0 lb

## 2011-10-16 DIAGNOSIS — N760 Acute vaginitis: Secondary | ICD-10-CM

## 2011-10-16 MED ORDER — FLUCONAZOLE 150 MG PO TABS: 150.0000 mg | ORAL_TABLET | Freq: Once | ORAL | Status: AC

## 2011-10-16 NOTE — Patient Instructions (Signed)
Use the Diflucan and repeat it if you need to. If you continue to have difficulty, come back and visit.

## 2011-10-16 NOTE — Progress Notes (Signed)
  Subjective:    Patient ID: [name] [name], female    DOB: 1931/10/23, 78 y.o.   MRN: [id number]  HPI She complains of vaginal itching for the last 3 months. She states that the symptoms are similar to a previous episode that responded well to Diflucan. She's had no discharge.   Review of Systems     Objective:   Physical Exam Vaginal exam shows the tissue to be normal. There was no discharge present. KOH and wet prep were negative       Assessment & Plan:   1. Vaginitis    I will treat her with Diflucan since she states the symptoms are similar. She will call if continued difficulty for reevaluation I reassured her that one dose she did not have a great effect on her Coumadin dosing.

## 2011-10-30 ENCOUNTER — Ambulatory Visit (INDEPENDENT_AMBULATORY_CARE_PROVIDER_SITE_OTHER): Payer: Medicare Other | Admitting: Pharmacist

## 2011-10-30 DIAGNOSIS — I4891 Unspecified atrial fibrillation: Secondary | ICD-10-CM | POA: Diagnosis not present

## 2011-11-26 ENCOUNTER — Encounter: Payer: Self-pay | Admitting: Cardiovascular Disease

## 2011-11-26 ENCOUNTER — Ambulatory Visit (INDEPENDENT_AMBULATORY_CARE_PROVIDER_SITE_OTHER): Payer: Medicare Other | Admitting: Cardiovascular Disease

## 2011-11-26 VITALS — BP 130/86 | HR 64 | Ht 64.0 in | Wt 145.4 lb

## 2011-11-26 DIAGNOSIS — R0600 Dyspnea, unspecified: Secondary | ICD-10-CM

## 2011-11-26 DIAGNOSIS — R0989 Other specified symptoms and signs involving the circulatory and respiratory systems: Secondary | ICD-10-CM

## 2011-11-26 DIAGNOSIS — E785 Hyperlipidemia, unspecified: Secondary | ICD-10-CM | POA: Diagnosis not present

## 2011-11-26 DIAGNOSIS — I10 Essential (primary) hypertension: Secondary | ICD-10-CM

## 2011-11-26 DIAGNOSIS — I4891 Unspecified atrial fibrillation: Secondary | ICD-10-CM | POA: Diagnosis not present

## 2011-11-26 DIAGNOSIS — I351 Nonrheumatic aortic (valve) insufficiency: Secondary | ICD-10-CM

## 2011-11-26 DIAGNOSIS — I359 Nonrheumatic aortic valve disorder, unspecified: Secondary | ICD-10-CM

## 2011-11-26 DIAGNOSIS — R0609 Other forms of dyspnea: Secondary | ICD-10-CM | POA: Diagnosis not present

## 2011-11-26 MED ORDER — PROPRANOLOL HCL ER 160 MG PO CP24: 160.0000 mg | ORAL_CAPSULE | Freq: Every day | ORAL | Status: DC

## 2011-11-26 MED ORDER — POTASSIUM CHLORIDE ER 10 MEQ PO TBCR: 10.0000 meq | EXTENDED_RELEASE_TABLET | Freq: Every day | ORAL | Status: DC

## 2011-11-26 MED ORDER — DIGOXIN 125 MCG PO TABS: 125.0000 ug | ORAL_TABLET | Freq: Every day | ORAL | Status: DC

## 2011-11-26 NOTE — Assessment & Plan Note (Signed)
We will check fasting labs at her next office visit in 6 months.

## 2011-11-26 NOTE — Assessment & Plan Note (Signed)
Her blood pressure is well-controlled. I'll see her again in 6 months.

## 2011-11-26 NOTE — Patient Instructions (Signed)
Your physician has requested that you have an echocardiogram. Echocardiography is a painless test that uses sound waves to create images of your heart. It provides your doctor with information about the size and shape of your heart and how well your heart's chambers and valves are working. This procedure takes approximately one hour. There are no restrictions for this procedure.  Your physician wants you to follow-up in: 6 MONTHS WITH EKG AND FASTING LABS  You will receive a reminder letter in the mail two months in advance. If you don't receive a letter, please call our office to schedule the follow-up appointment.

## 2011-11-26 NOTE — Assessment & Plan Note (Signed)
She has a history of mild aortic insufficiency. Her diastolic murmur sounds fairly loud today. We will recheck her echocardiogram for further evaluation of her aortic insufficiency.

## 2011-11-26 NOTE — Progress Notes (Signed)
[name] [name] Date of Birth  19-Dec-1931       [facility]    [place] [address]  [address] [place], [place]  [address]   [place], [place]  [address] [phone]     [phone]   Fax  [phone]    Fax [phone]  Problem List: 1. Atrial fibrillation-we have tried her on Rythmol, flecainide, so well, and amiodarone. Her insurance company will not pay for [employer]. 2. Hypertension  History of Present Illness:  She's still having lots of shortness of breath with exertion. We've performed a cardioversion several times. She did not respond to Rythmol, flecainide for amiodarone.  She's been seen by Dr. Johney Frame for consideration for RF ablation. He has determined that she is not a good candidate for RF ablation of her atrial fibrillation.  Current Outpatient Prescriptions on File Prior to Visit  Medication Sig Dispense Refill  . acetaminophen (TYLENOL) 500 MG tablet Take 500 mg by mouth every 4 (four) hours as needed.       . digoxin (LANOXIN) 0.125 MG tablet Take 1 tablet (125 mcg total) by mouth daily.  90 tablet  3  . hydrochlorothiazide (HYDRODIURIL) 25 MG tablet Take 1 tablet (25 mg total) by mouth daily.  90 tablet  3  . prazosin (MINIPRESS) 2 MG capsule Take 1 capsule (2 mg total) by mouth 2 (two) times daily.  180 capsule  3  . propranolol (INDERAL LA) 160 MG SR capsule Take 1 capsule (160 mg total) by mouth daily.  90 capsule  1  . propranolol (INDERAL) 10 MG tablet Take 10 mg by mouth 2 (two) times daily.       Marland Kitchen warfarin (COUMADIN) 5 MG tablet Take 1 tablet (5 mg total) by mouth daily. AS DIRECTED  90 tablet  3  . DISCONTD: propranolol (INDERAL) 10 MG tablet Take 1 tablet (10 mg total) by mouth 4 (four) times daily.  360 tablet  3    Allergies  Allergen Reactions  . Atorvastatin     Muscle aches  . Amiodarone Hcl     REACTION: Nausea/Vomiting; decrease appetite; affects liver enzymes  . Aspirin   . Diltiazem Hcl    REACTION: Nausea/vomitting  . Flecainide Acetate     REACTION: Intol: nausea/horrible feeling  . Metoprolol Succinate     REACTION: Nausea/vomiting;dizziness  . Sotalol Hcl     REACTION: Nausea/vomiting    Past Medical History  Diagnosis Date  . AF (atrial fibrillation)   . Hypertension   . Aortic insufficiency   . Mitral regurgitation   . Tricuspid regurgitation     No past surgical history on file.  History  Smoking status  . Former Smoker  . Quit date: 11/08/1995  Smokeless tobacco  . Not on file    History  Alcohol Use No    Family History  Problem Relation Age of Onset  . Aneurysm Mother 2  . Hodgkin's lymphoma Father 26    Reviw of Systems:  Reviewed in the HPI.  All other systems are negative.  Physical Exam: Blood pressure 130/86, pulse 64, height 5\' 4"  (1.626 m), weight 145 lb 6.4 oz (65.953 kg). General: Well developed, well nourished, in no acute distress.  Head: Normocephalic, atraumatic, sclera non-icteric, mucus membranes are moist,   Neck: Supple. Carotids are 2 + without bruits. No JVD. Her left ear is slightly swollen due to a bug bite this weekend.  Lungs: Clear bilaterally to  auscultation.  Heart: Irregularly irregular. She has a 2/6 diastolic murmur at the left sternal border.  Abdomen: Soft, non-tender, non-distended with normal bowel sounds. No hepatomegaly. No rebound/guarding. No masses.  Msk:  Strength and tone are normal  Extremities: No clubbing or cyanosis. No edema.  Distal pedal pulses are 2+ and equal bilaterally.  Neuro: Alert and oriented X 3. Moves all extremities spontaneously.  Psych:  Responds to questions appropriately with a normal affect.  ECG:  Assessment / Plan:

## 2011-11-26 NOTE — Assessment & Plan Note (Signed)
[name] [name] remains in atrial fibrillation. She complains of having lots of shortness breath when walking but she does not have any problems doing her exercises while seated.    she's able to do her exercises while seated or not sure that the atrial fibrillation is the sole source for her symptoms. She also has a murmur of aortic insufficiency. We'll check an echocardiogram for further evaluation of her aortic insufficiency.   She complains of lots of back and leg pain with ambulation and I suspect that she may have some back issues. This might also be contributing to her inability to exercise.

## 2011-11-27 ENCOUNTER — Other Ambulatory Visit: Payer: Self-pay | Admitting: Pharmacist

## 2011-11-27 DIAGNOSIS — I4891 Unspecified atrial fibrillation: Secondary | ICD-10-CM

## 2011-11-27 MED ORDER — WARFARIN SODIUM 5 MG PO TABS: 5.0000 mg | ORAL_TABLET | Freq: Every day | ORAL | Status: DC

## 2011-12-03 ENCOUNTER — Ambulatory Visit (HOSPITAL_COMMUNITY): Payer: Medicare Other | Attending: Cardiovascular Disease

## 2011-12-03 ENCOUNTER — Other Ambulatory Visit: Payer: Self-pay

## 2011-12-03 DIAGNOSIS — I079 Rheumatic tricuspid valve disease, unspecified: Secondary | ICD-10-CM | POA: Insufficient documentation

## 2011-12-03 DIAGNOSIS — I10 Essential (primary) hypertension: Secondary | ICD-10-CM | POA: Diagnosis not present

## 2011-12-03 DIAGNOSIS — E785 Hyperlipidemia, unspecified: Secondary | ICD-10-CM | POA: Diagnosis not present

## 2011-12-03 DIAGNOSIS — R0989 Other specified symptoms and signs involving the circulatory and respiratory systems: Secondary | ICD-10-CM | POA: Insufficient documentation

## 2011-12-03 DIAGNOSIS — I080 Rheumatic disorders of both mitral and aortic valves: Secondary | ICD-10-CM | POA: Insufficient documentation

## 2011-12-03 DIAGNOSIS — I4891 Unspecified atrial fibrillation: Secondary | ICD-10-CM

## 2011-12-03 DIAGNOSIS — R0600 Dyspnea, unspecified: Secondary | ICD-10-CM

## 2011-12-03 DIAGNOSIS — R0609 Other forms of dyspnea: Secondary | ICD-10-CM | POA: Diagnosis not present

## 2011-12-04 ENCOUNTER — Telehealth: Payer: Self-pay | Admitting: *Deleted

## 2011-12-04 DIAGNOSIS — I509 Heart failure, unspecified: Secondary | ICD-10-CM

## 2011-12-04 DIAGNOSIS — R0602 Shortness of breath: Secondary | ICD-10-CM

## 2011-12-04 MED ORDER — FUROSEMIDE 40 MG PO TABS: 40.0000 mg | ORAL_TABLET | Freq: Every day | ORAL | Status: DC

## 2011-12-04 NOTE — Telephone Encounter (Signed)
Message copied by Antony Odea on Tue Dec 04, 2011 10:41 AM ------      Message from: Vesta Mixer      Created: Tue Dec 04, 2011  6:05 AM       She has evidence of volume overload.  ( bilateral atrial enlargement).      Stop HCTZ and start Lasix 40 mg a day.  Continue KCl.      Check bmp, bnp in 1-2 weeks.

## 2011-12-04 NOTE — Telephone Encounter (Signed)
Results of echo given, meds changed, pt going out of town in a week so lab date was set for her coumadin date. Pt understands to continue KCL.

## 2011-12-11 ENCOUNTER — Other Ambulatory Visit (INDEPENDENT_AMBULATORY_CARE_PROVIDER_SITE_OTHER): Payer: Medicare Other

## 2011-12-11 ENCOUNTER — Ambulatory Visit (INDEPENDENT_AMBULATORY_CARE_PROVIDER_SITE_OTHER): Payer: Medicare Other | Admitting: Pharmacist

## 2011-12-11 DIAGNOSIS — I4891 Unspecified atrial fibrillation: Secondary | ICD-10-CM

## 2011-12-11 DIAGNOSIS — I509 Heart failure, unspecified: Secondary | ICD-10-CM | POA: Diagnosis not present

## 2011-12-11 DIAGNOSIS — R0602 Shortness of breath: Secondary | ICD-10-CM

## 2011-12-11 LAB — BASIC METABOLIC PANEL
BUN: 44 mg/dL — ABNORMAL HIGH (ref 6–23)
CO2: 27 mEq/L (ref 19–32)
Chloride: 101 mEq/L (ref 96–112)
Glucose, Bld: 118 mg/dL — ABNORMAL HIGH (ref 70–99)
Potassium: 4.4 mEq/L (ref 3.5–5.1)
Sodium: 144 mEq/L (ref 135–145)

## 2011-12-13 ENCOUNTER — Telehealth: Payer: Self-pay | Admitting: Cardiovascular Disease

## 2011-12-13 NOTE — Telephone Encounter (Signed)
msg left with lab results, told to watch salt intake, app made on coumadin day but unable to speak with pt, unsure if she can make this app and asked her to return call if inconvenient.

## 2011-12-13 NOTE — Telephone Encounter (Signed)
New problem:    Patient called in wanting to know the result of her latest blood test.  Please call back and if she cannot be reached please leave a message on home phone.

## 2011-12-13 NOTE — Telephone Encounter (Signed)
Message copied by Antony Odea on Thu Dec 13, 2011  5:41 PM ------      Message from: Vesta Mixer      Created: Wed Dec 12, 2011 11:37 AM       Labs look ok.  i should see her in a month or so.

## 2011-12-21 ENCOUNTER — Telehealth: Payer: Self-pay | Admitting: Cardiovascular Disease

## 2011-12-21 MED ORDER — FUROSEMIDE 40 MG PO TABS: 20.0000 mg | ORAL_TABLET | Freq: Every day | ORAL | Status: DC

## 2011-12-21 NOTE — Telephone Encounter (Signed)
New msg Pt wants to talk to you about her meds and she is confused about her appt.Please call her back

## 2011-12-21 NOTE — Telephone Encounter (Signed)
Pt unable to take lasix 40 mg daily, she has cut it back to 20 mg. C/o dizziness and inconvenient to void that much. Pt also needed to change her app for f/u.

## 2012-01-22 ENCOUNTER — Ambulatory Visit: Payer: Medicare Other | Admitting: Cardiovascular Disease

## 2012-01-29 ENCOUNTER — Other Ambulatory Visit: Payer: Self-pay | Admitting: *Deleted

## 2012-01-29 ENCOUNTER — Ambulatory Visit (INDEPENDENT_AMBULATORY_CARE_PROVIDER_SITE_OTHER): Payer: Medicare Other | Admitting: *Deleted

## 2012-01-29 ENCOUNTER — Ambulatory Visit: Payer: Medicare Other | Admitting: Cardiovascular Disease

## 2012-01-29 DIAGNOSIS — E785 Hyperlipidemia, unspecified: Secondary | ICD-10-CM

## 2012-01-29 DIAGNOSIS — I4891 Unspecified atrial fibrillation: Secondary | ICD-10-CM

## 2012-01-29 MED ORDER — PRAZOSIN HCL 2 MG PO CAPS: 2.0000 mg | ORAL_CAPSULE | Freq: Two times a day (BID) | ORAL | Status: DC

## 2012-01-30 ENCOUNTER — Ambulatory Visit (INDEPENDENT_AMBULATORY_CARE_PROVIDER_SITE_OTHER): Payer: Medicare Other | Admitting: Medical

## 2012-01-30 ENCOUNTER — Encounter: Payer: Self-pay | Admitting: Medical

## 2012-01-30 VITALS — BP 130/80 | HR 68 | Temp 97.5°F | Resp 16 | Wt 140.0 lb

## 2012-01-30 DIAGNOSIS — Z7901 Long term (current) use of anticoagulants: Secondary | ICD-10-CM

## 2012-01-30 DIAGNOSIS — T148XXA Other injury of unspecified body region, initial encounter: Secondary | ICD-10-CM

## 2012-01-30 DIAGNOSIS — IMO0002 Reserved for concepts with insufficient information to code with codable children: Secondary | ICD-10-CM | POA: Diagnosis not present

## 2012-01-30 MED ORDER — AMOXICILLIN-POT CLAVULANATE 875-125 MG PO TABS: 1.0000 | ORAL_TABLET | Freq: Two times a day (BID) | ORAL | Status: AC

## 2012-01-30 NOTE — Patient Instructions (Signed)
Use the wet to dry dressings to help wound healing - saline soaked gauze, put on wet, let dry, then change this twice daily.  If the area looks worse for infection as we discussed - worse redness, swelling, pain, then begin the Augmentin.

## 2012-01-30 NOTE — Progress Notes (Addendum)
Subjective: Was at a friend's house house about 2 weeks ago, and the friend has several big dogs including Labs and [place].  They were jumping up to see her in a friendly way, and one of the dogs toenails scratched her right lower leg.  Took 2 days to get the bleeding to stop given that she is on Coumadin.   Over the last week had some redness and swelling, but this is a little better the last 2 days.  Came in to get this checked out given possibility of infection.   Objective: Gen: wd, wn Skin: right distal anterior leg with 10 cm linear abrasion with dried blood and scab, slight pink/erythema around the wound, but no obvious induration, warmth, fluctuance, swelling, or obvious infection.    Ext: no swelling of LE Neurovascularly intact  Assessment: Encounter Diagnoses  Name Primary?  . Abrasion Yes  . Long term current use of anticoagulant    Plan: Currently the area doesn't appear infected, but does appear to be healing.  Discussed signs of possible infection.  She will use watch and wait approach, and if any sign of infection, begin Augmentin.  Can use wet to dry dressings for wound healing, but if worse or not improving in next several days, call or return.  Advised that if she begins Augmentin to have INR checked within 7-10 days.

## 2012-02-11 ENCOUNTER — Ambulatory Visit (INDEPENDENT_AMBULATORY_CARE_PROVIDER_SITE_OTHER): Payer: Medicare Other | Admitting: Cardiovascular Disease

## 2012-02-11 ENCOUNTER — Encounter: Payer: Self-pay | Admitting: Cardiovascular Disease

## 2012-02-11 VITALS — BP 110/64 | HR 60 | Ht 64.0 in | Wt 138.8 lb

## 2012-02-11 DIAGNOSIS — I509 Heart failure, unspecified: Secondary | ICD-10-CM | POA: Diagnosis not present

## 2012-02-11 LAB — BASIC METABOLIC PANEL
BUN: 26 mg/dL — ABNORMAL HIGH (ref 6–23)
CO2: 30 mEq/L (ref 19–32)
Chloride: 102 mEq/L (ref 96–112)
Creatinine, Ser: 1.1 mg/dL (ref 0.4–1.2)
Potassium: 3.7 mEq/L (ref 3.5–5.1)

## 2012-02-11 MED ORDER — FUROSEMIDE 20 MG PO TABS: 20.0000 mg | ORAL_TABLET | Freq: Every day | ORAL | Status: DC

## 2012-02-11 NOTE — Progress Notes (Signed)
[name] [name] Date of Birth  03-16-1932       [facility]    [place] [address]  [address] [place], [place]  [address]   [place], [place]  [address] [phone]     [phone]   Fax  [phone]    Fax [phone]  Problem List: 1. Atrial fibrillation-we have tried her on Rythmol, flecainide, Sotalol, and amiodarone. Her insurance company will not pay for [employer]. 2. Hypertension  History of Present Illness:  She's still having lots of shortness of breath with exertion. We've performed a cardioversion several times. She did not respond to Rythmol, flecainide for amiodarone.  She's been seen by Dr. Johney Frame for consideration for RF ablation. He has determined that she is not a good candidate for RF ablation of her atrial fibrillation.  She is doing pretty well.  She has some dyspnea but not as bad as she used to get. We added Lasix 40 mg a day to her medicine list lessor last year. It caused her to be very fatigued and feeling "wiped out ".  She lowered her dose to 20 mg and is feeling much better. Her breathing is much better since starting the Lasix.  Current Outpatient Prescriptions on File Prior to Visit  Medication Sig Dispense Refill  . acetaminophen (TYLENOL) 500 MG tablet Take 500 mg by mouth every 4 (four) hours as needed.       . digoxin (LANOXIN) 0.125 MG tablet Take 1 tablet (125 mcg total) by mouth daily.  90 tablet  3  . furosemide (LASIX) 40 MG tablet Take 0.5 tablets (20 mg total) by mouth daily.  30 tablet  11  . potassium chloride (KLOR-CON 10) 10 MEQ tablet Take 1 tablet (10 mEq total) by mouth daily.  90 tablet  3  . prazosin (MINIPRESS) 2 MG capsule Take 1 capsule (2 mg total) by mouth 2 (two) times daily.  180 capsule  3  . propranolol (INDERAL) 10 MG tablet Take 10 mg by mouth 2 (two) times daily.       . propranolol ER (INDERAL LA) 160 MG SR capsule Take 1 capsule (160 mg total) by mouth daily.  90 capsule  3   . warfarin (COUMADIN) 5 MG tablet Take 1 tablet (5 mg total) by mouth daily. AS DIRECTED  90 tablet  1  . DISCONTD: propranolol (INDERAL) 10 MG tablet Take 1 tablet (10 mg total) by mouth 4 (four) times daily.  360 tablet  3    Allergies  Allergen Reactions  . Atorvastatin     Muscle aches  . Amiodarone Hcl     REACTION: Nausea/Vomiting; decrease appetite; affects liver enzymes  . Aspirin   . Diltiazem Hcl     REACTION: Nausea/vomitting  . Flecainide Acetate     REACTION: Intol: nausea/horrible feeling  . Metoprolol Succinate     REACTION: Nausea/vomiting;dizziness  . Sotalol Hcl     REACTION: Nausea/vomiting    Past Medical History  Diagnosis Date  . AF (atrial fibrillation)   . Hypertension   . Aortic insufficiency   . Mitral regurgitation   . Tricuspid regurgitation     No past surgical history on file.  History  Smoking status  . Former Smoker  . Quit date: 11/08/1995  Smokeless tobacco  . Not on file    History  Alcohol Use No    Family History  Problem Relation Age of Onset  . Aneurysm Mother  90  . Hodgkin's lymphoma Father 10    Reviw of Systems:  Reviewed in the HPI.  All other systems are negative.  Physical Exam: Blood pressure 110/64, pulse 60, height 5\' 4"  (1.626 m), weight 138 lb 12.8 oz (62.959 kg). General: Well developed, well nourished, in no acute distress.  She is very tan   Head: Normocephalic, atraumatic, sclera non-icteric, mucus membranes are moist,   Neck: Supple. Carotids are 2 + without bruits. No JVD. Her left ear is slightly swollen due to a bug bite this weekend.  Lungs: Clear bilaterally to auscultation.  Heart: Irregularly irregular. She has a 2/6 diastolic murmur at the left sternal border.  Abdomen: Soft, non-tender, non-distended with normal bowel sounds. No hepatomegaly. No rebound/guarding. No masses.  Msk:  Strength and tone are normal  Extremities: No clubbing or cyanosis. No edema.  Distal pedal pulses are  2+ and equal bilaterally.  Neuro: Alert and oriented X 3. Moves all extremities spontaneously.  Psych:  Responds to questions appropriately with a normal affect.  ECG:  Assessment / Plan:

## 2012-02-11 NOTE — Patient Instructions (Signed)
Your physician recommends that you return for lab work in: today bmet   Your physician wants you to follow-up in: 6 months  You will receive a reminder letter in the mail two months in advance. If you don't receive a letter, please call our office to schedule the follow-up appointment.   Your physician recommends that you return for a FASTING lipid profile: 6 months    

## 2012-02-11 NOTE — Assessment & Plan Note (Signed)
[name] [name] is doing well. Continue with her same medications. Please that she's tolerating her atrial fibrillation is so well.  We'll check a basic metabolic profile today since she's changed her Lasix dose.  I will see her again 6 months for fasting labs and EKG.

## 2012-02-19 DIAGNOSIS — H11159 Pinguecula, unspecified eye: Secondary | ICD-10-CM | POA: Diagnosis not present

## 2012-02-19 DIAGNOSIS — H2510 Age-related nuclear cataract, unspecified eye: Secondary | ICD-10-CM | POA: Diagnosis not present

## 2012-03-11 ENCOUNTER — Ambulatory Visit (INDEPENDENT_AMBULATORY_CARE_PROVIDER_SITE_OTHER): Payer: Medicare Other | Admitting: *Deleted

## 2012-03-11 DIAGNOSIS — I4891 Unspecified atrial fibrillation: Secondary | ICD-10-CM | POA: Diagnosis not present

## 2012-03-11 LAB — POCT INR: INR: 2.2

## 2012-04-10 DIAGNOSIS — H2510 Age-related nuclear cataract, unspecified eye: Secondary | ICD-10-CM | POA: Diagnosis not present

## 2012-04-10 DIAGNOSIS — H11159 Pinguecula, unspecified eye: Secondary | ICD-10-CM | POA: Diagnosis not present

## 2012-04-10 DIAGNOSIS — H04129 Dry eye syndrome of unspecified lacrimal gland: Secondary | ICD-10-CM | POA: Diagnosis not present

## 2012-04-22 ENCOUNTER — Ambulatory Visit (INDEPENDENT_AMBULATORY_CARE_PROVIDER_SITE_OTHER): Payer: Medicare Other | Admitting: *Deleted

## 2012-04-22 DIAGNOSIS — I4891 Unspecified atrial fibrillation: Secondary | ICD-10-CM

## 2012-05-21 DIAGNOSIS — Z23 Encounter for immunization: Secondary | ICD-10-CM | POA: Diagnosis not present

## 2012-06-03 ENCOUNTER — Ambulatory Visit (INDEPENDENT_AMBULATORY_CARE_PROVIDER_SITE_OTHER): Payer: Medicare Other | Admitting: *Deleted

## 2012-06-03 DIAGNOSIS — I4891 Unspecified atrial fibrillation: Secondary | ICD-10-CM | POA: Diagnosis not present

## 2012-06-17 ENCOUNTER — Ambulatory Visit (INDEPENDENT_AMBULATORY_CARE_PROVIDER_SITE_OTHER): Payer: Medicare Other | Admitting: Family Medicine

## 2012-06-17 ENCOUNTER — Encounter: Payer: Self-pay | Admitting: Family Medicine

## 2012-06-17 VITALS — BP 112/70 | HR 53 | Wt 137.0 lb

## 2012-06-17 DIAGNOSIS — L089 Local infection of the skin and subcutaneous tissue, unspecified: Secondary | ICD-10-CM | POA: Diagnosis not present

## 2012-06-17 NOTE — Progress Notes (Signed)
  Subjective:    Patient ID: [name] [name], female    DOB: 14-Jun-1932, 78 y.o.   MRN: [id number]  HPI She got a foreign body stuck in her right thumb. This is been bothering her with swelling and pain for the last week.   Review of Systems     Objective:   Physical Exam The right thumb was digitally blocked. The thumb was cleaned with Betadine. A blackish 1 mm lesion was then excised without difficulty. A small incision was made. No purulent material was expressed. The thumb was cleaned and a pressure dressing applied. She will call if further difficulty.       Assessment & Plan:   1. Foreign body of thumb, right, superficial, infected

## 2012-07-01 ENCOUNTER — Other Ambulatory Visit: Payer: Self-pay | Admitting: Family Medicine

## 2012-07-01 DIAGNOSIS — Z1231 Encounter for screening mammogram for malignant neoplasm of breast: Secondary | ICD-10-CM

## 2012-07-15 ENCOUNTER — Ambulatory Visit (INDEPENDENT_AMBULATORY_CARE_PROVIDER_SITE_OTHER): Payer: Medicare Other | Admitting: *Deleted

## 2012-07-15 DIAGNOSIS — I4891 Unspecified atrial fibrillation: Secondary | ICD-10-CM | POA: Diagnosis not present

## 2012-07-15 LAB — POCT INR: INR: 2.1

## 2012-07-29 ENCOUNTER — Telehealth: Payer: Self-pay | Admitting: Cardiovascular Disease

## 2012-07-29 MED ORDER — WARFARIN SODIUM 5 MG PO TABS: 5.0000 mg | ORAL_TABLET | Freq: Every day | ORAL | Status: DC

## 2012-07-29 NOTE — Telephone Encounter (Signed)
New Problem:    Patient called in needing a 90 day refill of her warfarin (COUMADIN) 5 MG tablet called into the [facility] on [address]- [phone]

## 2012-08-01 ENCOUNTER — Ambulatory Visit
Admission: RE | Admit: 2012-08-01 | Discharge: 2012-08-01 | Disposition: A | Discharge status: Home / Self Care | Payer: Medicare Other | Source: Ambulatory Visit | Attending: Family Medicine | Admitting: Family Medicine

## 2012-08-01 DIAGNOSIS — Z1231 Encounter for screening mammogram for malignant neoplasm of breast: Secondary | ICD-10-CM | POA: Diagnosis not present

## 2012-08-05 ENCOUNTER — Other Ambulatory Visit: Payer: Self-pay | Admitting: *Deleted

## 2012-08-05 DIAGNOSIS — I10 Essential (primary) hypertension: Secondary | ICD-10-CM

## 2012-08-05 MED ORDER — PROPRANOLOL HCL ER 160 MG PO CP24: 160.0000 mg | ORAL_CAPSULE | Freq: Every day | ORAL | Status: DC

## 2012-08-05 NOTE — Telephone Encounter (Signed)
Fax Received. Refill Completed. [name] [name] (R.M.A)   

## 2012-08-15 ENCOUNTER — Other Ambulatory Visit: Payer: Self-pay | Admitting: *Deleted

## 2012-08-15 DIAGNOSIS — I4891 Unspecified atrial fibrillation: Secondary | ICD-10-CM

## 2012-08-15 DIAGNOSIS — I509 Heart failure, unspecified: Secondary | ICD-10-CM

## 2012-08-15 DIAGNOSIS — R0602 Shortness of breath: Secondary | ICD-10-CM

## 2012-08-15 DIAGNOSIS — E785 Hyperlipidemia, unspecified: Secondary | ICD-10-CM

## 2012-08-15 DIAGNOSIS — I10 Essential (primary) hypertension: Secondary | ICD-10-CM

## 2012-08-18 ENCOUNTER — Other Ambulatory Visit (INDEPENDENT_AMBULATORY_CARE_PROVIDER_SITE_OTHER): Payer: Medicare Other

## 2012-08-18 DIAGNOSIS — E785 Hyperlipidemia, unspecified: Secondary | ICD-10-CM

## 2012-08-18 DIAGNOSIS — I4891 Unspecified atrial fibrillation: Secondary | ICD-10-CM | POA: Diagnosis not present

## 2012-08-18 DIAGNOSIS — I10 Essential (primary) hypertension: Secondary | ICD-10-CM

## 2012-08-18 DIAGNOSIS — I509 Heart failure, unspecified: Secondary | ICD-10-CM | POA: Diagnosis not present

## 2012-08-18 DIAGNOSIS — R0602 Shortness of breath: Secondary | ICD-10-CM | POA: Diagnosis not present

## 2012-08-18 LAB — LDL CHOLESTEROL, DIRECT: Direct LDL: 127.6 mg/dL

## 2012-08-18 LAB — BASIC METABOLIC PANEL
CO2: 30 mEq/L (ref 19–32)
Calcium: 9.7 mg/dL (ref 8.4–10.5)
GFR: 58.25 mL/min — ABNORMAL LOW (ref >60.00)
Glucose, Bld: 106 mg/dL — ABNORMAL HIGH (ref 70–99)
Potassium: 4.6 mEq/L (ref 3.5–5.1)
Sodium: 141 mEq/L (ref 135–145)

## 2012-08-18 LAB — BRAIN NATRIURETIC PEPTIDE: Pro B Natriuretic peptide (BNP): 510 pg/mL — ABNORMAL HIGH (ref 0.0–100.0)

## 2012-08-18 LAB — LIPID PANEL
HDL: 54.8 mg/dL (ref >39.00)
Triglycerides: 54 mg/dL (ref 0.0–149.0)
VLDL: 10.8 mg/dL (ref 0.0–40.0)

## 2012-08-18 LAB — HEPATIC FUNCTION PANEL
Albumin: 4.1 g/dL (ref 3.5–5.2)
Bilirubin, Direct: 0.2 mg/dL (ref 0.0–0.3)
Total Protein: 7.2 g/dL (ref 6.0–8.3)

## 2012-08-21 ENCOUNTER — Encounter: Payer: Self-pay | Admitting: Cardiovascular Disease

## 2012-08-21 ENCOUNTER — Ambulatory Visit (INDEPENDENT_AMBULATORY_CARE_PROVIDER_SITE_OTHER): Payer: Medicare Other

## 2012-08-21 ENCOUNTER — Ambulatory Visit (INDEPENDENT_AMBULATORY_CARE_PROVIDER_SITE_OTHER): Payer: Medicare Other | Admitting: Cardiovascular Disease

## 2012-08-21 VITALS — BP 134/86 | HR 80 | Ht 64.0 in | Wt 134.8 lb

## 2012-08-21 DIAGNOSIS — I10 Essential (primary) hypertension: Secondary | ICD-10-CM

## 2012-08-21 DIAGNOSIS — I4891 Unspecified atrial fibrillation: Secondary | ICD-10-CM | POA: Diagnosis not present

## 2012-08-21 DIAGNOSIS — E785 Hyperlipidemia, unspecified: Secondary | ICD-10-CM

## 2012-08-21 DIAGNOSIS — I272 Pulmonary hypertension, unspecified: Secondary | ICD-10-CM | POA: Insufficient documentation

## 2012-08-21 DIAGNOSIS — I2789 Other specified pulmonary heart diseases: Secondary | ICD-10-CM

## 2012-08-21 LAB — POCT INR: INR: 2.3

## 2012-08-21 MED ORDER — FUROSEMIDE 20 MG PO TABS: 20.0000 mg | ORAL_TABLET | Freq: Every day | ORAL | Status: DC

## 2012-08-21 MED ORDER — SPIRONOLACTONE 12.5 MG HALF TABLET: 12.5000 mg | ORAL_TABLET | Freq: Every day | ORAL | Status: DC

## 2012-08-21 MED ORDER — PROPRANOLOL HCL 10 MG PO TABS: 10.0000 mg | ORAL_TABLET | Freq: Every day | ORAL | Status: DC

## 2012-08-21 NOTE — Progress Notes (Signed)
Cline Crock Date of Birth  [birth year]       [facility]    [place] [address]  [address] [place], [place]  [address]   [place], [place]  [address] [phone]     [phone]   Fax  [phone]    Fax [phone]  Problem List: 1. Atrial fibrillation-we have tried her on Rythmol, flecainide, Sotalol, and amiodarone. Her insurance company will not pay for [employer]. 2. Hypertension  History of Present Illness:  She's still having lots of shortness of breath with exertion. We've performed a cardioversion several times. She did not respond to Rythmol, flecainide for amiodarone.  She's been seen by Dr. Johney Frame for consideration for RF ablation. He has determined that she is not a good candidate for RF ablation of her atrial fibrillation.  She is doing pretty well.  She has some dyspnea but not as bad as she used to get. We added Lasix 40 mg a day to her medicine list lessor last year. It caused her to be very fatigued and feeling "wiped out ".  She lowered her dose to 20 mg and is feeling much better. Her breathing is much better since starting the Lasix.  Feb. 6, 2014:  Parish is feeling the same.  She still has dyspnea when walking any distance.  She has lots of hip pain due to arthritis.   Current Outpatient Prescriptions on File Prior to Visit  Medication Sig Dispense Refill  . digoxin (LANOXIN) 0.125 MG tablet Take 1 tablet (125 mcg total) by mouth daily.  90 tablet  3  . furosemide (LASIX) 20 MG tablet Take 1 tablet (20 mg total) by mouth daily.  30 tablet  11  . potassium chloride (KLOR-CON 10) 10 MEQ tablet Take 1 tablet (10 mEq total) by mouth daily.  90 tablet  3  . prazosin (MINIPRESS) 2 MG capsule Take 1 capsule (2 mg total) by mouth 2 (two) times daily.  180 capsule  3  . propranolol ER (INDERAL LA) 160 MG SR capsule Take 1 capsule (160 mg total) by mouth daily.  120 capsule  3  . warfarin (COUMADIN) 5 MG tablet Take 1  tablet (5 mg total) by mouth daily.  90 tablet  1  . acetaminophen (TYLENOL) 500 MG tablet Take 500 mg by mouth every 4 (four) hours as needed.         Allergies  Allergen Reactions  . Atorvastatin     Muscle aches  . Amiodarone Hcl     REACTION: Nausea/Vomiting; decrease appetite; affects liver enzymes  . Aspirin   . Diltiazem Hcl     REACTION: Nausea/vomitting  . Flecainide Acetate     REACTION: Intol: nausea/horrible feeling  . Metoprolol Succinate     REACTION: Nausea/vomiting;dizziness  . Sotalol Hcl     REACTION: Nausea/vomiting    Past Medical History  Diagnosis Date  . AF (atrial fibrillation)   . Hypertension   . Aortic insufficiency   . Mitral regurgitation   . Tricuspid regurgitation     No past surgical history on file.  History  Smoking status  . Former Smoker  . Quit date: 11/08/1995  Smokeless tobacco  . Not on file    History  Alcohol Use No    Family History  Problem Relation Age of Onset  . Aneurysm Mother 79  . Hodgkin's lymphoma Father 16    Reviw of Systems:  Reviewed in the  HPI.  All other systems are negative.  Physical Exam: Blood pressure 134/86, pulse 80, height 5\' 4"  (1.626 m), weight 134 lb 12.8 oz (61.145 kg). General: Well developed, well nourished, in no acute distress.  She is very tan   Head: Normocephalic, atraumatic, sclera non-icteric, mucus membranes are moist,   Neck: Supple. Carotids are 2 + without bruits. No JVD. Her left ear is slightly swollen due to a bug bite this weekend.  Lungs: Clear bilaterally to auscultation.  Heart: Irregularly irregular. She has a 2/6 diastolic murmur at the left sternal border.  Abdomen: Soft, non-tender, non-distended with normal bowel sounds. No hepatomegaly. No rebound/guarding. No masses.  Msk:  Strength and tone are normal  Extremities: No clubbing or cyanosis. No edema.  Distal pedal pulses are 2+ and equal bilaterally.  Neuro: Alert and oriented X 3. Moves all  extremities spontaneously.  Psych:  Responds to questions appropriately with a normal affect.  ECG: Feb.  6, 2014:   Atrial fibrillation at a rate of 80. She has occasional premature ventricular contractions versus apparently conducted beats. She has an incomplete right bundle branch block.  Assessment / Plan:

## 2012-08-21 NOTE — Assessment & Plan Note (Addendum)
Her atrial  fibrillation rate is well controlled. We will continue with her current dose of Inderal LA.

## 2012-08-21 NOTE — Assessment & Plan Note (Signed)
Reina presents today for followup of her atrial fibrillation and pulmonary hypertension. She has well-preserved left ventricle systolic function but does have chronic atrial fibrillation and has moderate aortic insufficiency. We have tried her on Lasix 40 mg a day but this made her feel too fatigued. She seems to be tolerating Lasix 20 mg day but is still having shortness of breath with exertion.  He walked around the office approximately 600 or 700 feet today. She started with an O2 saturation of 95%.  At the end of the walk her O2 saturation dropped to 91% and she was starting to have some shortness of breath as well as leg fatigue.  We will add spironolactone 12.5 mg a day. We'll discontinue her potassium. We'll have her check a basic metabolic into 2 weeks. I'll see her again in 3 months for followup office visit and basic metabolic profile. We'll have her go to pulmonary rehabilitation to see if she would benefit from that.

## 2012-08-21 NOTE — Patient Instructions (Addendum)
Your physician recommends that you schedule a follow-up appointment in: 3 MONTHS   Your physician recommends that you return for lab work in: 2-3 WEEKS BMET    Your physician has recommended you make the following change in your medication:   START SPIRONOLACTONE 12.5 MG DAILY STOP KDUR/ POTASSIUM CHLORIDE   You have been referred to PULMONARY REHAB DUE TO SHORTNESS OF BREATH WITH AMBULATION AND DECREASED PULSE OXYGENATION.

## 2012-08-22 ENCOUNTER — Ambulatory Visit (INDEPENDENT_AMBULATORY_CARE_PROVIDER_SITE_OTHER): Payer: Medicare Other | Admitting: Internal Medicine

## 2012-08-22 ENCOUNTER — Encounter: Payer: Self-pay | Admitting: Internal Medicine

## 2012-08-22 ENCOUNTER — Ambulatory Visit (INDEPENDENT_AMBULATORY_CARE_PROVIDER_SITE_OTHER)
Admission: RE | Admit: 2012-08-22 | Discharge: 2012-08-22 | Disposition: A | Discharge status: Home / Self Care | Payer: Medicare Other | Source: Ambulatory Visit | Attending: Internal Medicine | Admitting: Internal Medicine

## 2012-08-22 VITALS — BP 102/66 | HR 55 | Temp 97.8°F | Ht 64.0 in | Wt 136.2 lb

## 2012-08-22 DIAGNOSIS — R0989 Other specified symptoms and signs involving the circulatory and respiratory systems: Secondary | ICD-10-CM

## 2012-08-22 DIAGNOSIS — R0609 Other forms of dyspnea: Secondary | ICD-10-CM

## 2012-08-22 DIAGNOSIS — I509 Heart failure, unspecified: Secondary | ICD-10-CM | POA: Diagnosis not present

## 2012-08-22 DIAGNOSIS — I5030 Unspecified diastolic (congestive) heart failure: Secondary | ICD-10-CM

## 2012-08-22 DIAGNOSIS — R0600 Dyspnea, unspecified: Secondary | ICD-10-CM

## 2012-08-22 DIAGNOSIS — I712 Thoracic aortic aneurysm, without rupture: Secondary | ICD-10-CM

## 2012-08-22 DIAGNOSIS — R0602 Shortness of breath: Secondary | ICD-10-CM | POA: Diagnosis not present

## 2012-08-22 NOTE — Patient Instructions (Addendum)
Please see patient coordinator before you leave today  to schedule for overnight pulse oximetry with no polish on nails and we will call result  Please remember to go to the  x-ray department downstairs for your tests - we will call you with the results when they are available.    See if the days your breathing seems better correlate with a lower pulse before you start your activity  Please schedule a follow up office visit in 6 weeks, call sooner if needed with pfts (and rehab referral at that point vs [facility]) Late add: will need to hold diuretics for 24 h and have CTa done 2/10

## 2012-08-22 NOTE — Progress Notes (Signed)
  Subjective:    Patient ID: [name] [name], female    DOB: 1931/12/07 MRN: [id number]  HPI  73 yowf quit smoking 1987 and very active no problem at all breathing until onset afib around 2008 and never right since referred 08/22/2012 to Pulmonary clinic by Dr Melburn Popper for sob.   08/22/2012 1st pulmonary cc sob aburpt onset 2008 when dx with afib,  Only better p cardioverted then relapsed, never has sob  sitting still but progress to point of doe x walking to car with legs give out too about the same time, 02 sats dropped to low 90's walking at Dr Sallee Provencal office 2/6.  No obvious daytime variabilty or assoc chronic cough or cp or chest tightness, subjective wheeze overt sinus or hb symptoms. No unusual exp hx or h/o childhood pna/ asthma or premature birth to her knowledge.   Sleeping ok without nocturnal  or early am exacerbation  of respiratory  c/o's or need for noct saba. Also denies any obvious fluctuation of symptoms with weather or environmental changes or other aggravating or alleviating factors except as outlined above    Review of Systems  Constitutional: Positive for activity change. Negative for fever and unexpected weight change.  HENT: Positive for rhinorrhea. Negative for ear pain, nosebleeds, congestion, sore throat, sneezing, trouble swallowing, dental problem, postnasal drip and sinus pressure.   Eyes: Negative for redness and itching.  Respiratory: Positive for shortness of breath. Negative for cough, chest tightness and wheezing.   Cardiovascular: Negative for palpitations and leg swelling.  Gastrointestinal: Negative for nausea and vomiting.  Genitourinary: Negative for dysuria.  Musculoskeletal: Negative for joint swelling.  Skin: Negative for rash.  Neurological: Positive for headaches.  Hematological: Does not bruise/bleed easily.  Psychiatric/Behavioral: Negative for dysphoric mood. The patient is not nervous/anxious.        Objective:   Physical Exam Wt Readings  from Last 3 Encounters:  08/22/12 136 lb 3.2 oz (61.78 kg)  08/21/12 134 lb 12.8 oz (61.145 kg)  06/17/12 137 lb (62.143 kg)     HEENT: nl dentition, turbinates, and orophanx. Nl external ear canals without cough reflex   NECK :  without JVD/Nodes/TM/ nl carotid upstrokes bilaterally   LUNGS: no acc muscle use, clear to A and P bilaterally without cough on insp or exp maneuvers   CV:  RRR  no s3  II/VI sem and diast m also but  no edema, P 2 slt increased vs A2  ABD:  soft and nontender with nl excursion in the supine position. No bruits or organomegaly, bowel sounds nl  MS:  warm without deformities, calf tenderness, cyanosis or clubbing  SKIN: warm and dry without lesions    NEURO:  alert, approp, no deficits     cxr 08/22/12 1. Progression of a chronic descending thoracic aortic aneurysm since 2010, now up to 8 cm diameter (versus 5-6 cm in 2010). 2. Stable cardiomegaly. No acute pulmonary process identified.       Assessment & Plan:

## 2012-08-23 DIAGNOSIS — I712 Thoracic aortic aneurysm, without rupture: Secondary | ICD-10-CM | POA: Insufficient documentation

## 2012-08-23 NOTE — Assessment & Plan Note (Signed)
-   Left ventricle: Systolic function was normal. The estimated ejection fraction was in the range of 60% to 65%. - Aortic valve: Moderate regurgitation. - Mitral valve: Mild regurgitation. - Left atrium: The atrium was severely dilated. - Right atrium: The atrium was severely dilated. - Tricuspid valve: Moderate-severe regurgitation. - Pulmonary arteries: Systolic pressure was moderately increased. PA peak pressure: 50mm Hg (S).  On basis of LA = RA suspect the problem is PAH on basis of elevated L Ht pressures.

## 2012-08-23 NOTE — Assessment & Plan Note (Signed)
-   Sats low 90's walking in office 08/21/12  Suspect her sob is multifactorial but mostly restrictive by her exam and cxr with her heart and aorta taking up a lot of space inside her chest which is moderatedly kyphotic as well.  However the abupt onset correlated historically with her afib so I suspect the diastolic chf and AI/MR are definitely playing a role and will need [facility] to sort out whether has PAH secondary to L ht pressures or not.

## 2012-08-23 NOTE — Assessment & Plan Note (Signed)
CXR suggests it is well over 6 cm though still asymptomatic > CTa next step

## 2012-08-25 ENCOUNTER — Other Ambulatory Visit: Payer: Self-pay | Admitting: Internal Medicine

## 2012-08-25 DIAGNOSIS — I714 Abdominal aortic aneurysm, without rupture: Secondary | ICD-10-CM

## 2012-08-25 DIAGNOSIS — I7789 Other specified disorders of arteries and arterioles: Secondary | ICD-10-CM

## 2012-08-25 NOTE — Progress Notes (Signed)
Quick Note:  Spoke with pt and notified of results per Dr. Sherene Sires. Pt verbalized understanding and denied any questions. Order was sent to [facility] ______

## 2012-08-26 DIAGNOSIS — R0609 Other forms of dyspnea: Secondary | ICD-10-CM | POA: Diagnosis not present

## 2012-08-26 DIAGNOSIS — R0989 Other specified symptoms and signs involving the circulatory and respiratory systems: Secondary | ICD-10-CM | POA: Diagnosis not present

## 2012-09-02 ENCOUNTER — Telehealth: Payer: Self-pay | Admitting: Internal Medicine

## 2012-09-02 NOTE — Telephone Encounter (Signed)
Per MW ONO on RA reviewed and was normal  No need for o2  I spoke with the pt and notified of this and she verbalized understanding  She states that she is going to have ct done tomorrow and was advised by MW to hold dieretic 2 days prior to this She wants to know if she can start back on her meds right after she has the CT  Please advise thanks!

## 2012-09-02 NOTE — Telephone Encounter (Signed)
Spoke with Okey Dupre See other phone note dated today

## 2012-09-03 ENCOUNTER — Ambulatory Visit (INDEPENDENT_AMBULATORY_CARE_PROVIDER_SITE_OTHER)
Admission: RE | Admit: 2012-09-03 | Discharge: 2012-09-03 | Disposition: A | Discharge status: Home / Self Care | Payer: Medicare Other | Source: Ambulatory Visit | Attending: Internal Medicine | Admitting: Internal Medicine

## 2012-09-03 ENCOUNTER — Ambulatory Visit (INDEPENDENT_AMBULATORY_CARE_PROVIDER_SITE_OTHER)
Admission: RE | Admit: 2012-09-03 | Discharge: 2012-09-03 | Disposition: A | Discharge status: Home / Self Care | Payer: Medicare Other | Source: Ambulatory Visit | Attending: Cardiovascular Disease | Admitting: Cardiovascular Disease

## 2012-09-03 ENCOUNTER — Other Ambulatory Visit: Payer: Self-pay

## 2012-09-03 DIAGNOSIS — I712 Thoracic aortic aneurysm, without rupture: Secondary | ICD-10-CM

## 2012-09-03 DIAGNOSIS — I714 Abdominal aortic aneurysm, without rupture: Secondary | ICD-10-CM | POA: Diagnosis not present

## 2012-09-03 DIAGNOSIS — I7789 Other specified disorders of arteries and arterioles: Secondary | ICD-10-CM

## 2012-09-03 MED ORDER — IOHEXOL 300 MG/ML  SOLN
100.0000 mL | Freq: Once | INTRAMUSCULAR | Status: AC | PRN
  Administered 2012-09-03: 100 mL via INTRAVENOUS

## 2012-09-03 NOTE — Telephone Encounter (Signed)
Yes that's fine 

## 2012-09-03 NOTE — Telephone Encounter (Signed)
I spoke with pt and is aware. Nothing further was needed 

## 2012-09-04 ENCOUNTER — Other Ambulatory Visit: Payer: Medicare Other

## 2012-09-05 ENCOUNTER — Encounter: Payer: Self-pay | Admitting: Surgery

## 2012-09-08 ENCOUNTER — Encounter: Payer: Self-pay | Admitting: Cardiothoracic Surgery

## 2012-09-08 ENCOUNTER — Institutional Professional Consult (permissible substitution) (INDEPENDENT_AMBULATORY_CARE_PROVIDER_SITE_OTHER): Payer: Medicare Other | Admitting: Cardiothoracic Surgery

## 2012-09-08 ENCOUNTER — Other Ambulatory Visit (INDEPENDENT_AMBULATORY_CARE_PROVIDER_SITE_OTHER): Payer: Medicare Other

## 2012-09-08 VITALS — Ht 64.0 in | Wt 132.0 lb

## 2012-09-08 DIAGNOSIS — I10 Essential (primary) hypertension: Secondary | ICD-10-CM

## 2012-09-08 DIAGNOSIS — E785 Hyperlipidemia, unspecified: Secondary | ICD-10-CM

## 2012-09-08 DIAGNOSIS — I712 Thoracic aortic aneurysm, without rupture: Secondary | ICD-10-CM

## 2012-09-08 DIAGNOSIS — I4891 Unspecified atrial fibrillation: Secondary | ICD-10-CM

## 2012-09-08 LAB — BASIC METABOLIC PANEL
BUN: 24 mg/dL — ABNORMAL HIGH (ref 6–23)
Creatinine, Ser: 1 mg/dL (ref 0.4–1.2)
GFR: 56.25 mL/min — ABNORMAL LOW (ref >60.00)
Potassium: 3.8 mEq/L (ref 3.5–5.1)

## 2012-09-08 NOTE — Patient Instructions (Signed)
Thoracic Aortic Aneurysm An aneurysm is the enlargement (dilatation), bulging or ballooning out of part of the wall of a vein or artery. An Aortic Aneurysm is a bulging in the largest artery of the body. This artery supplies blood from the heart to the rest of the body. The first part of the aorta is called the thoracic aorta. It leaves the heart, ascends (rises), arches, and descends (goes down) through the chest until it reaches the diaphragm (the muscular partition between the chest and abdomen (belly). The second part of the aorta is then called the abdominal aorta after it has passed the diaphragm and continues down through the abdomen. The abdominal aorta ends where it splits to form the two iliac arteries that go to the legs. Aortic aneurysms can develop anywhere along the length of the aorta. A thoracic aortic aneurysm (TAA) occurs in the first part of the aorta, between the heart and the diaphragm. The major importance of an aneurysm is that it can rupture or tear (dissect), causing death unless diagnosed and treated promptly. CAUSES  Most thoracic aortic aneurysms are related to arteriosclerosis. The arteriosclerosis can weaken the aortic wall. The pressure of the blood being pumped through the aorta causes it to balloon out at the site of weakness. Therefore, elevated blood pressure (hypertension) is associated with aneurysm. Other risk factors include:  Age over 60.  Tobacco use.  Female sex.  Family history of aneurysm. Additional causes of thoracic aortic aneurysms include:  Genetics (passed by birth).  Injury: After physical trauma to the aorta.  Inflammation of blood vessels.  Hardening of the arteries.  Infection. SYMPTOMS  Many aneurysms do not cause problems. A small, unchanging or slowly changing aneurysm may produce no symptoms until it suddenly ruptures or dissects (separation of the layers of the aortic wall) without warning. It may then cause death. The symptoms  (problems) of a developing aneurysm will partly depend on its size and rate of growth. Thoracic aortic aneurysms may cause pain in the:  Chest.  Back.  Sides.  Abdomen. The pain most often has a deep quality as if it is boring into the person. It may cause:  Heart failure.  Heart attack.  Hoarseness.  Cough.  Shortness of breath.  Swallowing problems. DIAGNOSIS  A thoracic aortic aneurysm may be suspected based on your symptoms. It may also be detected by x-ray or CT studies done for unrelated reasons.  Several different imaging studies can be used to confirm a TAA:  An echocardiogram is an ultrasound test to examine the heart. It can also examine the first parts of the aorta. Sometimes, this test is done by putting you to sleep and inserting a flexible telescope through your mouth into your esophagus, which is next to the aorta; excellent pictures of the aorta can be obtained. This is called a transesophageal echocardiogram (TEE).  CT scanning of the chest is accurate at showing the exact size and shape of the aneurysm.  MRI scanning is accurate, and is used for certain types of TAA.  An aortic angiogram shows the source of the major blood vessels arising from the aorta. It reveals the size and extent of any aneurysm. It can also show a clot clinging to the wall of the aneurysm (mural thrombus). The angiogram may give information about a tear of the aorta. TREATMENT  Treatment for a thoracic aortic aneurysm depends on:  Location.  Size.  Other factors.  Rate of growth.  Underlying cause. Medical treatment is used   for smaller or complicated aneurysms, or those that do not cause symptoms. These include:  Stopping smoking.  Blood pressure control.  Control of cholesterol. Surgical treatment is used for aneurysms that cause symptoms, or for those that are large or growing in size. The surgical technique depends on the location of the aneurysm. HOME CARE INSTRUCTIONS     If you smoke, stop. If you do not, do not start.  Take all medications as prescribed.  Your caregiver will tell you when to have your aneurysm rechecked, either by echocardiogram or CT scan. Be sure to keep this and all follow-up appointments. SEEK MEDICAL CARE IF:   You develop mild pain in your chest, upper back, sides, or abdomen.  You develop cough, hoarseness or trouble swallowing. SEEK IMMEDIATE MEDICAL CARE IF:   You develop severe chest or abdominal pain, or severe pain moving (radiating) to your back.  You suddenly develop cold or blue toes or feet.  You suddenly develop lightheadedness or fainting spells.  You develop trouble breathing. Document Released: 07/02/2005 Document Revised: 09/24/2011 Document Reviewed: 05/21/2007 [facility] Patient Information 2013 [place], [place].  Abdominal Aortic Aneurysm, Endograft Repair The aorta is a large blood vessel that carries blood from the heart to the rest of the body. An abdominal aortic aneurysm (AAA) is a weakness in the wall of the aorta and can cause the aorta to balloon or bulge out. If the aneurysm becomes too big, it can burst and be fatal.  An endograft is a special aortic stent graft used to repair the AAA. It is a tube made up of fabric, which is supported by a metal mesh (stent). It prevents the aneurysm from rupturing. The endograft creates a new track for the blood to flow inside the aorta, diverting it from the aneurysm. The stent is permanent. SYMPTOMS   May be absent.  Pain in the abdomen, chest or back. This may be a "throbbing," "aching" or "tearing" feeling.  A lump or mass in the abdomen that pulses.  Cold foot or blue toes.  Severe pain and collapse if the aneurysm bursts. RISK FACTORS  Family history of AAA.  Smoking.  High blood pressure.  High levels of fat (cholesterol) in the blood that cling to the wall of the aorta.  "Hardening of the arteries" (arteriosclerosis).  Heart  disease.  Diabetes. TREATMENT   When the aneurysm is small, it will be watched closely.  The size is followed once or twice a year with an imaging study, such as an ultrasound.  Making lifestyle changes, such as quitting smoking and changing what you eat, are very important.  Your caregiver may have you take medicine to help reduce blood pressure and high cholesterol.  An aneurysm can be repaired in 1 of 2 ways:  Placement of an endograft through small cuts in your upper thigh (groin).  Major surgery to repair the aneurysm. LET YOUR CAREGIVER KNOW ABOUT:  Allergies.  Medicines taken including herbs, eye drops, prescription medicines (especially medicines used to "thin the blood"), aspirin and other over-the-counter medicines, and steroids (by mouth or as a cream).  History of blood clots in your legs and/or lungs.  Previous problems with anesthetics or medicines used to numb the skin.  History of bleeding or blood problems.  Previous surgery.  Possibility of pregnancy, if this applies.  Any other important health problems. RISKS AND COMPLICATIONS  Leaking of blood around the endograft.  Infection.  Displacement of the endograft away from the effective location.  A  block in the flow of blood through the graft. In rare cases, this can cause a block of blood flow to the legs.  Possible blood clots.  Kidney problems may occur in some people.  Exposure to X-rays and radiation.  Rare rupture of the aorta even after successful endograft repair.  Possible surgical repair of the aneurysm. BEFORE THE PROCEDURE   Your caregiver will explain the risks and benefits of endograph repair and answer your questions.  You may have a complete physical exam and tests to assess your general health.  Other tests are done to help determine the size and location of the AAA. This helps with choosing the correct size and shape of the endograph.  You may be asked to stop taking  certain medicines (particularly medicines that "thin the blood") before the procedure.  You will be asked to not eat or drink anything beginning at midnight before the procedure. You may be told to take your regular medicines with a little water on the morning of the procedure. These instructions may vary if the procedure takes place in the afternoon. PROCEDURE   The groin area will be washed and shaved.  Small cuts (incisions) are usually made on both sides of your groin. Long, thin tubes (catheters) are passed through these incisions into your leg artery and up into the aneurysm.  Live X-ray pictures are used to guide the endograft through the catheter to the site of aneurysm.  The endograft is released to seal off the aneurysm and line the aorta.  X-rays will check the position of the endograft and confirm placement.  The catheters are taken out, and the incisions are closed with a few stitches.  The procedure may take 1 to 3 hours. AFTER THE PROCEDURE   You will need to lie flat for several hours. Bending the leg that has the insertion site can cause it to bleed and swell.  You may need to stay in the hospital for a few days.  You will need follow-up visits to assess the repair.  Certain tests may be done to check the function and location of the endograph after your procedure. HOME CARE INSTRUCTIONS   Follow all the instructions given by your caregiver.  Check your incisions for signs of infection.  Keep your appointments.  Limit your activities as directed.  Be sure you know how and when to take your medicines.  Make lifestyle changes to improve your health and quality of life. SEEK MEDICAL CARE IF:   You develop abdominal, chest or back pain.  You have an oral temperature above 102 F (38.9 C).  It is hard to breathe.  There is increased swelling around your groin site.  There is a leak of fluid/blood from the groin site. SEEK IMMEDIATE MEDICAL CARE IF:    There is a sudden increase in swelling or bleeding around your groin site. This is a medical emergency.  There is a sudden onset of pain in your legs and/or difficulty in moving either of your legs.  You notice a red streak starting at the groin site and extending above or below that site.  You have an oral temperature above 102 F (38.9 C), not controlled by medicine. MAKE SURE YOU:   Understand these instructions.  Will watch your condition.  Will get help right away if you are not doing well or get worse. Document Released: 11/18/2008 Document Revised: 09/24/2011 Document Reviewed: 11/18/2008 [facility] Patient Information 2013 [place], [place].

## 2012-09-08 NOTE — Progress Notes (Addendum)
[address]            [place] [address]          [phone]      [name] Medical Record #[id number] Date of Birth: 1932-01-01  Referring: Nahser, Deloris Ping, MD Primary Care: [name], MD  Chief Complaint:    Chief Complaint  Patient presents with  . Thoracic Aortic Aneurysm    surgical eval, Chest CTA  09/03/12    History of Present Illness:    Patient is a 79 year old female who was diagnosed with atrial fibrillation 5-6 years ago. She's had several cardioversions and hospitalizations to regulate anti-arrhythmic drugs. Because of increasing shortness of breath with exertion and increasing fatigue she recently was referred to Dr. Sherene Sires prior to starting in cardiopulmonary rehabilitation. An overnight SpO2 study was normal. A chest x-ray was obtained. Because of the findings on chest x-ray of a dilated aorta she has been referred to thoracic surgery office. Chest x-rays dating back to 2008 show evidence of a descending thoracic aneurysm, especially obvious on the lateral films. The most recent chest x-ray in February of this year again shows evidence of a descending thoracic aneurysm that has increased in size from the chest x-ray in 2008 in 2010. A CTA of the chest abdomen and pelvis has been performed and the patient comes to the office to discuss the diagnosis and treatment.   She has known valvular heart disease and chronic atrial fibrillation on anticoagulation. She denies any previous history of myocardial infarction. She does note increasing shortness of breath with exertion and also cramping in her legs and especially hips when she walks. She denies orthopnea. He smoked for approximately 20 years but quit more than 30 years ago.   Current Activity/ Functional Status:  Patient is independent with mobility/ambulation, transfers, ADL's, IADL's.  Zubrod Score: At the time of surgery this patient's most appropriate  activity status/level should be described as: []  Normal activity, no symptoms [x]  Symptoms, fully ambulatory []  Symptoms, in bed less than or equal to 50% of the time []  Symptoms, in bed greater than 50% of the time but less than 100% []  Bedridden []  Moribund   Past Medical History  Diagnosis Date  . AF (atrial fibrillation)   . Hypertension   . Aortic insufficiency   . Mitral regurgitation   . Tricuspid regurgitation   . CHF (congestive heart failure)   . Aortic aneurysm     Past Surgical History  Procedure Laterality Date  . Tonsillectomy    . Cystectomy      sacrum    Family History  Problem Relation Age of Onset  . Aneurysm Mother 62  . Hodgkin's lymphoma Father 47  . Cancer Mother     breast    History   Social History  . Marital Status: Widowed    Spouse Name: N/A    Number of Children: 3  . Years of Education: N/A   Occupational History  . Holiday representative    Social History Main Topics  . Smoking status: Former Smoker -- 1.00 packs/day for 20 years    Types: Cigarettes    Quit date: 08/22/1985  . Smokeless tobacco: Never Used  . Alcohol Use: Yes     Comment: a glass of wine every night  . Drug Use: No  .        History  Smoking status  . Former Smoker -- 1.00 packs/day for 20 years  . Types: Cigarettes  . Quit date: 08/22/1985  Smokeless tobacco  . Never Used    History  Alcohol Use  . Yes    Comment: a glass of wine every night     Allergies  Allergen Reactions  . Atorvastatin     Muscle aches  . Amiodarone Hcl     REACTION: Nausea/Vomiting; decrease appetite; affects liver enzymes  . Aspirin   . Diltiazem Hcl     REACTION: Nausea/vomitting  . Flecainide Acetate     REACTION: Intol: nausea/horrible feeling  . Metoprolol Succinate     REACTION: Nausea/vomiting;dizziness  . Sotalol Hcl     REACTION: Nausea/vomiting    Current Outpatient Prescriptions  Medication Sig Dispense Refill  . acetaminophen (TYLENOL) 500 MG tablet  Take 500 mg by mouth every 4 (four) hours as needed.       . digoxin (LANOXIN) 0.125 MG tablet Take 1 tablet (125 mcg total) by mouth daily.  90 tablet  3  . furosemide (LASIX) 20 MG tablet Take 1 tablet (20 mg total) by mouth daily.  90 tablet  3  . prazosin (MINIPRESS) 2 MG capsule Take 1 capsule (2 mg total) by mouth 2 (two) times daily.  180 capsule  3  . propranolol (INDERAL) 10 MG tablet Take 1 tablet (10 mg total) by mouth daily. 10 MG ONCE IN THE MORNING  90 tablet  3  . propranolol ER (INDERAL LA) 160 MG SR capsule Take 1 capsule (160 mg total) by mouth daily.  120 capsule  3  . spironolactone (ALDACTONE) 12.5 mg TABS Take 0.5 tablets (12.5 mg total) by mouth daily.  15 tablet  6  . warfarin (COUMADIN) 5 MG tablet Take 1 tablet (5 mg total) by mouth daily.  90 tablet  1   No current facility-administered medications for this visit.       Review of Systems:     Cardiac Review of Systems: Y or N  Chest Pain [  n  ]  Resting SOB [n  ] Exertional SOB  Cove.Etienne  ]  Orthopnea [ n ]   Pedal Edema Cove.Etienne   ]    Palpitations [ y ] Syncope  [n  ]   Presyncope [ n  ]  General Review of Systems: [Y] = yes [  ]=no Constitional: recent weight change [ n ]; anorexia [  ]; fatigue [  ]; nausea [  ]; night sweats [n  ]; fever [n]; or chills [ n ];                                                                                                                                          Dental: poor dentition[n  ]; Last Dentist visit:   Eye : blurred vision [  ]; diplopia [   ];  vision changes [  ];  Amaurosis fugax[  ]; Resp: cough [  ];  wheezing[ n ];  hemoptysis[n  ]; shortness of breath[ y ]; paroxysmal nocturnal dyspnea[n  ]; dyspnea on exertion[n  ]; or orthopnea[  ];  GI:  gallstones[  ], vomiting[  ];  dysphagia[  ]; melena[  ];  hematochezia [  ]; heartburn[  ];   Hx of  Colonoscopy[  ]; GU: kidney stones [  ]; hematuria[  ];   dysuria [  ];  nocturia[  ];  history of     obstruction [  ]; urinary  frequency [  ]             Skin: rash, swelling[  ];, hair loss[  ];  peripheral edema[  ];  or itching[  ]; Musculosketetal: myalgias[  ];  joint swelling[  ];  joint erythema[  ];  joint pain[  ];  back pain[  ];  Heme/Lymph: bruising[  ];  bleeding[  ];  anemia[  ];  Neuro: TIA[  ];  headaches[  ];  stroke[  ];  vertigo[  ];  seizures[  ];   paresthesias[  ];  difficulty walking[  ];  Psych:depression[  ]; anxiety[  ];  Endocrine: diabetes[ n ];  thyroid dysfunction[  ];  Immunizations: Flu Cove.Etienne  ]; Pneumococcal[ y ];  Other: Told she had end-stage renal disease at age 58 but has never had any problems since  Physical Exam: Ht 5\' 4"  (1.626 m)  Wt 132 lb (59.875 kg)  BMI 22.65 kg/m2  SpO2 %  Physical Examination: Physical Examination: General appearance - alert, well appearing, and in no distress, oriented to person, place, and time and normal appearing weight Mental status - alert, oriented to person, place, and time, anxious Neck - supple, no significant adenopathy, carotids upstroke normal bilaterally, no bruits Lymphatics - no palpable lymphadenopathy, no hepatosplenomegaly Chest - clear to auscultation, no wheezes, rales or rhonchi, symmetric air entry Heart - irregularly irregular rhythm with rate 70, systolic murmur 3/6 at lower left sternal border, diastolic murmur 3/6 at lower left sternal border Abdomen - soft, nontender, nondistended, no masses or organomegaly no rebound tenderness noted no abdominal bruits no pulsatile masses no CVA tenderness no inguinal adenopathy Neurological - alert, oriented, normal speech, no focal findings or movement disorder noted, motor and sensory grossly normal bilaterally, normal muscle tone, no tremors, strength 5/5 Musculoskeletal - no joint tenderness, deformity or swelling Extremities - peripheral pulses normal, no pedal edema, no clubbing or cyanosis, varicose veins noted, venous stasis dermatitis noted Skin - normal coloration and  turgor, no rashes, no suspicious skin lesions noted Venous stasis changes in the large comes 2+ femoral pulses bilaterally    Diagnostic Studies & Laboratory data:     Recent Radiology Findings:  Dg Chest 2 View  08/22/2012  *RADIOLOGY REPORT*  Clinical Data: 79 year old female with shortness of breath, atrial fibrillation.  CHEST - 2 VIEW  Comparison: 01/04/2009 and earlier.  Findings: Chronic ectasia and aneurysmal enlargement of the descending thoracic aorta has progressed since 2010.  Maximum descending thoracic aorta diameter now as much as 8 cm (approximately 5-6 cm in 2010).  The vessel appears aneurysmal along the approximate 10 cm segment on the lateral view (not significantly changed).  Chronic cardiomegaly.  Other mediastinal contours are stable. Visualized tracheal air column is within normal limits.  No pneumothorax, pulmonary edema, pleural effusion or acute pulmonary opacity. Stable visualized osseous structures.  IMPRESSION:  1.  Progression of a chronic descending thoracic aortic aneurysm since 2010, now up to 8 cm diameter (versus 5-6 cm in 2010). 2.  Stable cardiomegaly.  No acute pulmonary process identified.  Impression #1 discussed by telephone with RN Lauren at Dr. Thurston Hole office at the time of dictation.   Original Report Authenticated By: Erskine Speed, M.D.    Ct Angio Chest Aorta W/cm &/or Wo/cm  09/03/2012  *RADIOLOGY REPORT*  Clinical Data:  Aortic aneurysm.  CT ANGIOGRAPHY CHEST, ABDOMEN AND PELVIS  Technique:  Multidetector CT imaging through the chest, abdomen and pelvis was performed using the standard protocol during bolus administration of intravenous contrast.  Multiplanar reconstructed images including MIPs were obtained and reviewed to evaluate the vascular anatomy.  Contrast: OMNIPAQUE IOHEXOL 300 MG/ML  SOLN,  Comparison:   None.  CTA CHEST  Findings:  Borderline aneurysmal dilatation of the ascending aorta. Maximal diameters of the ascending aorta at the sinus of  Valsalva, sinotubular junction, and ascending aorta are 3.8 cm, 3.6 cm, and 4.0 cm respectively.  The proximal descending aorta is nonaneurysmal.  Approximately 9 cm beyond the origin of the descending aorta aneurysmal dilatation develops measuring up to 6.1 cm in maximal diameter.  There is chronic appearing mural thrombus posteriorly and laterally to the left.  The aorta assumes a more normal caliber in the distal descending aorta and then becomes ectatic again at the diaphragmatic hiatus.  Focal dissection occurs in the right posterolateral region of the aorta at the diaphragmatic hiatus.  It does not appear flow limiting.  Atherosclerotic calcifications of the aortic arch, ascending aorta, and descending aorta are present.  Minimal calcifications in the aortic valve.  Left main coronary artery calcification.  Three- vessel coronary artery calcifications are present.  Atherosclerotic changes at the origins of the great vessels are noted.  Hot no significant narrowing involving the innominate artery, hot right subclavian artery, right common carotid artery, left common carotid artery, and left subclavian artery.  Bilateral vertebral arteries are also grossly patent.  There are no obvious filling defects in the pulmonary arterial tree to suggest acute pulmonary thromboembolism.  Negative abnormal mediastinal adenopathy.  Thyroid gland is within normal limits.  No pneumothorax.  No pericardial effusion.  Small left pleural effusion is present and layering.  No vertebral compression deformity.   Review of the MIP images confirms the above findings.  IMPRESSION: Aneurysmal dilatation of the descending thoracic aorta as described measuring up to 6.1 cm.  CTA ABDOMEN AND PELVIS  Findings:  At the diaphragmatic hiatus, the abdominal aorta is 3.9 cm in diameter.  A focal dissection in the right posterolateral location is noted which does not appear flow limiting. Atherosclerotic faster calcifications in the aorta are  noted within the abdomen.  There remainder of the aorta is nonaneurysmal.  There is significant narrowing at the origin of the celiac artery, between 50-60%.  Branch vessels are patent.  Accessory left hepatic artery is noted.  SMA is patent.  Branch vessels are grossly patent.  Left renal artery is tortuous and patent in mild atherosclerotic changes at its origin.  Right renal artery is patent.  Mild atherosclerotic changes at its origin.  The infrarenal aorta is mildly tortuous.  Atherosclerotic changes are present with mild anterolateral plaque towards the left on image 115.  Right common and external iliac arteries are tortuous and patent with mild atherosclerotic changes.  Left common iliac artery is patent with moderate atherosclerotic changes.  Left external iliac artery is  patent.  Mild disease of the internal iliac arteries without significant narrowing.  The IMA is diminutive with moderate disease at its origin.  Branch vessels are grossly patent.  Sub centimeter hypodensity in the left kidney on image 8 of series 9 is nonspecific.  Liver, gallbladder, spleen, adrenal glands, and pancreas are within normal limits.  Trace free fluid in the pelvis is nonspecific.  Uterus and adnexa are within normal limits.  Bladder is within normal limits.  No obvious abnormal adenopathy.  Advanced degenerative changes in the lumbar spine are present. Severe narrowing at L4-5 is accompanied by 6 mm anterolisthesis L4 upon L5.  There is associated marked facet arthropathy.  6 mm and anterolisthesis L5 upon S1.  An element of spinal stenosis is suspected at L4-5.   Review of the MIP images confirms the above findings.  IMPRESSION: Mild aneurysmal dilatation of the aorta at the diaphragmatic hiatus.  The infrarenal aorta is nonaneurysmal with mild atherosclerotic plaque.  Significant narrowing at the origin of the celiac axis.  SMA is patent.  Advanced degenerative change in the lumbar spine.   Original Report Authenticated  By: Jolaine Click, M.D.    Ct Angio Abd/pel W/ And/or W/o  09/03/2012  *RADIOLOGY REPORT*  Clinical Data:  Aortic aneurysm.  CT ANGIOGRAPHY CHEST, ABDOMEN AND PELVIS  Technique:  Multidetector CT imaging through the chest, abdomen and pelvis was performed using the standard protocol during bolus administration of intravenous contrast.  Multiplanar reconstructed images including MIPs were obtained and reviewed to evaluate the vascular anatomy.  Contrast: OMNIPAQUE IOHEXOL 300 MG/ML  SOLN,  Comparison:   None.  CTA CHEST  Findings:  Borderline aneurysmal dilatation of the ascending aorta. Maximal diameters of the ascending aorta at the sinus of Valsalva, sinotubular junction, and ascending aorta are 3.8 cm, 3.6 cm, and 4.0 cm respectively.  The proximal descending aorta is nonaneurysmal.  Approximately 9 cm beyond the origin of the descending aorta aneurysmal dilatation develops measuring up to 6.1 cm in maximal diameter.  There is chronic appearing mural thrombus posteriorly and laterally to the left.  The aorta assumes a more normal caliber in the distal descending aorta and then becomes ectatic again at the diaphragmatic hiatus.  Focal dissection occurs in the right posterolateral region of the aorta at the diaphragmatic hiatus.  It does not appear flow limiting.  Atherosclerotic calcifications of the aortic arch, ascending aorta, and descending aorta are present.  Minimal calcifications in the aortic valve.  Left main coronary artery calcification.  Three- vessel coronary artery calcifications are present.  Atherosclerotic changes at the origins of the great vessels are noted.  Hot no significant narrowing involving the innominate artery, hot right subclavian artery, right common carotid artery, left common carotid artery, and left subclavian artery.  Bilateral vertebral arteries are also grossly patent.  There are no obvious filling defects in the pulmonary arterial tree to suggest acute pulmonary  thromboembolism.  Negative abnormal mediastinal adenopathy.  Thyroid gland is within normal limits.  No pneumothorax.  No pericardial effusion.  Small left pleural effusion is present and layering.  No vertebral compression deformity.   Review of the MIP images confirms the above findings.  IMPRESSION: Aneurysmal dilatation of the descending thoracic aorta as described measuring up to 6.1 cm.  CTA ABDOMEN AND PELVIS  Findings:  At the diaphragmatic hiatus, the abdominal aorta is 3.9 cm in diameter.  A focal dissection in the right posterolateral location is noted which does not appear flow limiting. Atherosclerotic faster  calcifications in the aorta are noted within the abdomen.  There remainder of the aorta is nonaneurysmal.  There is significant narrowing at the origin of the celiac artery, between 50-60%.  Branch vessels are patent.  Accessory left hepatic artery is noted.  SMA is patent.  Branch vessels are grossly patent.  Left renal artery is tortuous and patent in mild atherosclerotic changes at its origin.  Right renal artery is patent.  Mild atherosclerotic changes at its origin.  The infrarenal aorta is mildly tortuous.  Atherosclerotic changes are present with mild anterolateral plaque towards the left on image 115.  Right common and external iliac arteries are tortuous and patent with mild atherosclerotic changes.  Left common iliac artery is patent with moderate atherosclerotic changes.  Left external iliac artery is patent.  Mild disease of the internal iliac arteries without significant narrowing.  The IMA is diminutive with moderate disease at its origin.  Branch vessels are grossly patent.  Sub centimeter hypodensity in the left kidney on image 8 of series 9 is nonspecific.  Liver, gallbladder, spleen, adrenal glands, and pancreas are within normal limits.  Trace free fluid in the pelvis is nonspecific.  Uterus and adnexa are within normal limits.  Bladder is within normal limits.  No obvious  abnormal adenopathy.  Advanced degenerative changes in the lumbar spine are present. Severe narrowing at L4-5 is accompanied by 6 mm anterolisthesis L4 upon L5.  There is associated marked facet arthropathy.  6 mm and anterolisthesis L5 upon S1.  An element of spinal stenosis is suspected at L4-5.   Review of the MIP images confirms the above findings.  IMPRESSION: Mild aneurysmal dilatation of the aorta at the diaphragmatic hiatus.  The infrarenal aorta is nonaneurysmal with mild atherosclerotic plaque.  Significant narrowing at the origin of the celiac axis.  SMA is patent.  Advanced degenerative change in the lumbar spine.   Original Report Authenticated By: Jolaine Click, M.D.     Recent Lab Findings: Lab Results  Component Value Date   WBC 8.7 01/04/2009   HGB 14.9 01/04/2009   HCT 44.8 01/04/2009   PLT 204 01/04/2009   GLUCOSE 106* 08/18/2012   CHOL 204* 08/18/2012   TRIG 54.0 08/18/2012   HDL 54.80 08/18/2012   LDLDIRECT 127.6 08/18/2012   LDLCALC 100* 10/05/2010   ALT 25 08/18/2012   AST 29 08/18/2012   NA 141 08/18/2012   K 4.6 08/18/2012   CL 105 08/18/2012   CREATININE 1.0 08/18/2012   BUN 26* 08/18/2012   CO2 30 08/18/2012   TSH 1.382 Test methodology is 3rd generation TSH 01/05/2009   INR 2.3 08/21/2012   ECHO last done 09/2011 Transthoracic Echocardiography  Patient: Yatzari, Jonsson MR #: [id number] Study Date: 12/03/2011 Gender: F Age: 50 Height: 162.6cm Weight: 65.8kg BSA: 1.66m^2 Pt. Status: Room:  ATTENDING Nahser, Isa Rankin Nahser, Alvia Grove Nahser, Philip SONOGRAPHER [place], Alona Bene PERFORMING [facility], [facility] cc:  ------------------------------------------------------------ LV EF: 60% - 65%  ------------------------------------------------------------ Indications: Atrial fibrillation - 427.31.  ------------------------------------------------------------ History: PMH: Acquired from the patient and from the patient's chart. Dyspnea. Atrial fibrillation. Aortic valve  disease. Mitral valve disease. Primary pulmonary hypertension. Risk factors: Dyslipidemia.  ------------------------------------------------------------ Study Conclusions  - Left ventricle: Systolic function was normal. The estimated ejection fraction was in the range of 60% to 65%. - Aortic valve: Moderate regurgitation. - Mitral valve: Mild regurgitation. - Left atrium: The atrium was severely dilated. - Right atrium: The atrium was severely dilated. - Tricuspid valve: Moderate-severe  regurgitation. - Pulmonary arteries: Systolic pressure was moderately increased. PA peak pressure: 50mm Hg (S). - Pericardium, extracardiac: A trivial pericardial effusion was identified. Transthoracic echocardiography. M-mode, complete 2D, spectral Doppler, and color Doppler. Height: Height: 162.6cm. Height: 64in. Weight: Weight: 65.8kg. Weight: 144.7lb. Body mass index: BMI: 24.9kg/m^2. Body surface area: BSA: 1.55m^2. Blood pressure: 130/86. Patient status: Outpatient. Location: [place] [facility]  ------------------------------------------------------------  ------------------------------------------------------------ Left ventricle: Wall thickness was normal. Systolic function was normal. The estimated ejection fraction was in the range of 60% to 65%.  ------------------------------------------------------------ Aortic valve: Mildly thickened leaflets. Doppler: Moderate regurgitation. Peak velocity ratio of LVOT to aortic valve: 0.76. Indexed valve area: 1.4cm^2/m^2 (Vmax). Peak gradient: 11mm Hg (S).  ------------------------------------------------------------ Aorta: Aortic root: The aortic root was normal in size. Ascending aorta: The ascending aorta was normal in size.  ------------------------------------------------------------ Mitral valve: Doppler: Mild regurgitation.  ------------------------------------------------------------ Left atrium: The atrium was severely  dilated.  ------------------------------------------------------------ Right ventricle: The cavity size was normal. Wall thickness was normal. Systolic function was normal.  ------------------------------------------------------------ Pulmonic valve: Doppler: No significant regurgitation.  ------------------------------------------------------------ Tricuspid valve: Doppler: Moderate-severe regurgitation.  ------------------------------------------------------------ Pulmonary artery: Systolic pressure was moderately increased.  ------------------------------------------------------------ Right atrium: The atrium was severely dilated.  ------------------------------------------------------------ Pericardium: A trivial pericardial effusion was identified.  ------------------------------------------------------------ Systemic veins: Inferior vena cava: The vessel was normal in size; the respirophasic diameter changes were in the normal range (= 50%); findings are consistent with normal central venous pressure.  ------------------------------------------------------------  2D measurements Normal Doppler measurements Norma Left ventricle l LVID ED, 47.1 mm 43-52 Main pulmonary artery chord, Pressure, 50 mm Hg =30 PLAX S LVID ES, 27.1 mm 23-38 LVOT chord, Peak vel, 126 cm/s ----- PLAX S FS, chord, 42 % >29 VTI, S 27. cm ----- PLAX 1 LVPW, ED 8.16 mm ------ Peak 6 mm Hg ----- IVS/LVPW 0.96 <1.3 gradient, ratio, ED S Ventricular septum HR 71 bpm ----- IVS, ED 7.84 mm ------ Aortic valve LVOT Peak vel, 165 cm/s ----- Diam, S 20 mm ------ S Area 3.14 cm^2 ------ Peak 11 mm Hg ----- Aorta gradient, Root diam, 32 mm ------ S ED Peak vel 0.7 ----- Left atrium ratio, 6 AP dim 58 mm ------ LVOT/AV AP dim 3.39 cm/m^2 <2.2 Area index 1.4 cm^2/m^2 ----- index (Vmax) Regurg PHT 827 ms ----- Tricuspid valve Regurg 335 cm/s ----- peak vel Peak RV-RA 45 mm Hg  ----- gradient, S Systemic veins Estimated 5 mm Hg ----- CVP Right ventricle Pressure, 50 mm Hg <30 S  ------------------------------------------------------------ Prepared and Electronically Authenticated by  Kristeen Miss 2013-05-20T15:03:25.250   Assessment / Plan:     #1 6.1 cm descending thoracic aneurysm, mild aneurysmal dilatation at the diaphragmatic hiatus, significant narrowing of the celiac artery by CT, there's chest x-ray evidence that this aneurysm has slowly been enlarging since 2008 #2 valvular heart disease with moderate aortic insufficiency, mild mitral insufficiency, moderate to severe tricuspid insufficiency #3 chronic atrial fibrillation #4 history of hypertension #5 chronic Coumadin therapy secondary to a fibrillation  I discussed with the patient in detail and reviewed the CT findings with her concerning the diagnosis of descending thoracic aortic aneurysm. With the current size I recommended to her that she consider placement of thoracic stent graft to prevent further enlargement and potential rupture of the descending thoracic aorta. The risks and options of medical treatment versus surgical treatment have been reviewed in detail. She is aware of the risk of spinal cord ischemia and paraplegia with any intervention. I discussed with her that with  the size of the aorta currently the risk of rupture is increasing exponentially. She would like to discuss this with her 3 daughters and will return on in 3 days to discuss a final plan. Mean time we will obtain ABIs, and repeat echocardiogram since the last one is almost a year ago.    Delight Ovens MD  Beeper [phone] Office [phone] 09/08/2012 4:26 PM

## 2012-09-09 ENCOUNTER — Other Ambulatory Visit: Payer: Self-pay

## 2012-09-09 DIAGNOSIS — I059 Rheumatic mitral valve disease, unspecified: Secondary | ICD-10-CM

## 2012-09-09 DIAGNOSIS — I359 Nonrheumatic aortic valve disorder, unspecified: Secondary | ICD-10-CM

## 2012-09-10 ENCOUNTER — Ambulatory Visit (INDEPENDENT_AMBULATORY_CARE_PROVIDER_SITE_OTHER): Payer: Medicare Other | Admitting: Family Medicine

## 2012-09-10 ENCOUNTER — Ambulatory Visit (HOSPITAL_COMMUNITY)
Admission: RE | Admit: 2012-09-10 | Discharge: 2012-09-10 | Disposition: A | Discharge status: Home / Self Care | Payer: Medicare Other | Source: Ambulatory Visit | Attending: Cardiothoracic Surgery | Admitting: Cardiothoracic Surgery

## 2012-09-10 ENCOUNTER — Encounter: Payer: Self-pay | Admitting: Family Medicine

## 2012-09-10 VITALS — BP 118/80 | HR 74 | Wt 132.0 lb

## 2012-09-10 DIAGNOSIS — I712 Thoracic aortic aneurysm, without rupture, unspecified: Secondary | ICD-10-CM | POA: Insufficient documentation

## 2012-09-10 DIAGNOSIS — I079 Rheumatic tricuspid valve disease, unspecified: Secondary | ICD-10-CM | POA: Insufficient documentation

## 2012-09-10 DIAGNOSIS — R748 Abnormal levels of other serum enzymes: Secondary | ICD-10-CM

## 2012-09-10 DIAGNOSIS — R0989 Other specified symptoms and signs involving the circulatory and respiratory systems: Secondary | ICD-10-CM | POA: Insufficient documentation

## 2012-09-10 DIAGNOSIS — R0609 Other forms of dyspnea: Secondary | ICD-10-CM | POA: Diagnosis not present

## 2012-09-10 DIAGNOSIS — I4891 Unspecified atrial fibrillation: Secondary | ICD-10-CM | POA: Diagnosis not present

## 2012-09-10 DIAGNOSIS — I359 Nonrheumatic aortic valve disorder, unspecified: Secondary | ICD-10-CM

## 2012-09-10 DIAGNOSIS — I080 Rheumatic disorders of both mitral and aortic valves: Secondary | ICD-10-CM | POA: Insufficient documentation

## 2012-09-10 DIAGNOSIS — J310 Chronic rhinitis: Secondary | ICD-10-CM | POA: Diagnosis not present

## 2012-09-10 DIAGNOSIS — Z01818 Encounter for other preprocedural examination: Secondary | ICD-10-CM | POA: Insufficient documentation

## 2012-09-10 DIAGNOSIS — I509 Heart failure, unspecified: Secondary | ICD-10-CM | POA: Insufficient documentation

## 2012-09-10 DIAGNOSIS — I059 Rheumatic mitral valve disease, unspecified: Secondary | ICD-10-CM | POA: Diagnosis not present

## 2012-09-10 DIAGNOSIS — Z01810 Encounter for preprocedural cardiovascular examination: Secondary | ICD-10-CM | POA: Diagnosis not present

## 2012-09-10 LAB — COMPREHENSIVE METABOLIC PANEL
ALT: 14 U/L (ref 0–35)
Alkaline Phosphatase: 182 U/L — ABNORMAL HIGH (ref 39–117)
Potassium: 3.9 mEq/L (ref 3.5–5.3)
Sodium: 138 mEq/L (ref 135–145)
Total Bilirubin: 1.2 mg/dL (ref 0.3–1.2)
Total Protein: 6.9 g/dL (ref 6.0–8.3)

## 2012-09-10 NOTE — Progress Notes (Signed)
  Echocardiogram 2D Echocardiogram has been performed.  Clista Rainford 09/10/2012, 11:23 AM

## 2012-09-10 NOTE — Progress Notes (Signed)
  Subjective:    Patient ID: [name] [name], female    DOB: 1931-08-02, 79 y.o.   MRN: [id number]  HPI She is here for consult concerning elevated alkaline phosphatase. She did have recent blood work which did show this. Review of record indicates she has had a slowly increasing level. She has had no nausea, vomiting, abdominal pain, bloating. She is in the process of being evaluated for treatment of a aortic aneurysm.  CT scan of her abdomen was done and liver, gallbladder, pancreas are all normal. She continues to have difficulty with atrial fibrillation and continues on Coumadin.   Review of Systems     Objective:   Physical Exam Alert and in no distress. Cardiac exam shows an irregular rhythm. Lungs clear to auscultation. Abdominal exam shows no masses or tenderness.       Assessment & Plan:  Gustatory rhinitis  Elevated alkaline phosphatase level - Plan: Comprehensive metabolic panel, Alkaline Phosphatase, Isoenzymes  Atrial fibrillation  Descending thoracic aortic aneurysm discussed the aneurysm and history  . Apparently she is going to have a sleeve use. I will also followup on her alkaline phosphatase was isoenzyme study.

## 2012-09-10 NOTE — Progress Notes (Signed)
VASCULAR LAB PRELIMINARY  ARTERIAL  ABI completed:    RIGHT    LEFT    PRESSURE WAVEFORM  PRESSURE WAVEFORM  BRACHIAL 138 Tri BRACHIAL 130 Tri  DP 137 Bi DP 118 Bi  AT   AT    PT 139 Tri PT 128 Tri  PER   PER    GREAT TOE  NA GREAT TOE  NA    RIGHT LEFT  ABI 1.01 0.93     Farrel Demark, RDMS, RVT  09/10/2012, 10:14 AM

## 2012-09-11 ENCOUNTER — Ambulatory Visit (INDEPENDENT_AMBULATORY_CARE_PROVIDER_SITE_OTHER): Payer: Medicare Other | Admitting: Cardiothoracic Surgery

## 2012-09-11 ENCOUNTER — Encounter: Payer: Self-pay | Admitting: Cardiothoracic Surgery

## 2012-09-11 VITALS — BP 111/71 | HR 71 | Resp 16 | Ht 64.0 in | Wt 132.0 lb

## 2012-09-11 DIAGNOSIS — I340 Nonrheumatic mitral (valve) insufficiency: Secondary | ICD-10-CM

## 2012-09-11 DIAGNOSIS — I079 Rheumatic tricuspid valve disease, unspecified: Secondary | ICD-10-CM

## 2012-09-11 DIAGNOSIS — I359 Nonrheumatic aortic valve disorder, unspecified: Secondary | ICD-10-CM

## 2012-09-11 DIAGNOSIS — I059 Rheumatic mitral valve disease, unspecified: Secondary | ICD-10-CM

## 2012-09-11 DIAGNOSIS — I4891 Unspecified atrial fibrillation: Secondary | ICD-10-CM

## 2012-09-11 DIAGNOSIS — I482 Chronic atrial fibrillation, unspecified: Secondary | ICD-10-CM

## 2012-09-11 DIAGNOSIS — I351 Nonrheumatic aortic (valve) insufficiency: Secondary | ICD-10-CM

## 2012-09-11 DIAGNOSIS — I712 Thoracic aortic aneurysm, without rupture: Secondary | ICD-10-CM

## 2012-09-11 DIAGNOSIS — I071 Rheumatic tricuspid insufficiency: Secondary | ICD-10-CM

## 2012-09-11 NOTE — Progress Notes (Signed)
[address]       [place] [address]             [id number]        [name] [name] [facility] Medical Record #[id number] Date of Birth: [birth year]  Referring: Nahser, Deloris Ping, MD Primary Care: [name], MD  Chief Complaint:    Chief Complaint  Patient presents with  . TAA    f/u after ECHO and ABI's    History of Present Illness:    Patient is a 79 year old female who was diagnosed with atrial fibrillation 5-6 years ago. She's had several cardioversions and hospitalizations to regulate anti-arrhythmic drugs. Because of increasing shortness of breath with exertion and increasing fatigue she recently was referred to Dr. Sherene Sires prior to starting in cardiopulmonary rehabilitation. An overnight SpO2 study was normal. A chest x-ray was obtained. Because of the findings on chest x-ray of a dilated aorta she has been referred to thoracic surgery office. Chest x-rays dating back to 2008 show evidence of a descending thoracic aneurysm, especially obvious on the lateral films. The most recent chest x-ray in February of this year again shows evidence of a descending thoracic aneurysm that has increased in size from the chest x-ray in 2008 in 2010. A CTA of the chest abdomen and pelvis has been performed and the patient comes to the office to discuss the diagnosis and treatment.   She has known valvular heart disease and chronic atrial fibrillation on anticoagulation. She denies any previous history of myocardial infarction. She does note increasing shortness of breath with exertion and also cramping in her legs and especially hips when she walks. She denies orthopnea. He smoked for approximately 20 years but quit more than 30 years ago.   Current Activity/ Functional Status:  Patient is independent with mobility/ambulation, transfers, ADL's, IADL's.  Zubrod Score: At the time of surgery this patient's most appropriate activity status/level should be  described as: []  Normal activity, no symptoms [x]  Symptoms, fully ambulatory []  Symptoms, in bed less than or equal to 50% of the time []  Symptoms, in bed greater than 50% of the time but less than 100% []  Bedridden []  Moribund   Past Medical History  Diagnosis Date  . AF (atrial fibrillation)   . Hypertension   . Aortic insufficiency   . Mitral regurgitation   . Tricuspid regurgitation   . CHF (congestive heart failure)   . Aortic aneurysm     Past Surgical History  Procedure Laterality Date  . Tonsillectomy    . Cystectomy      sacrum    Family History  Problem Relation Age of Onset  . Aneurysm Mother 44  . Hodgkin's lymphoma Father 22  . Cancer Mother     breast    History   Social History  . Marital Status: Widowed    Spouse Name: N/A    Number of Children: 3  . Years of Education: N/A   Occupational History  . Holiday representative    Social History Main Topics  . Smoking status: Former Smoker -- 1.00 packs/day for 20 years    Types: Cigarettes    Quit date: 08/22/1985  . Smokeless tobacco: Never Used  . Alcohol Use: Yes     Comment: a glass of wine every night  . Drug Use: No  .        History  Smoking status  . Former Smoker --  1.00 packs/day for 20 years  . Types: Cigarettes  . Quit date: 08/22/1985  Smokeless tobacco  . Never Used    History  Alcohol Use  . Yes    Comment: a glass of wine every night     Allergies  Allergen Reactions  . Atorvastatin     Muscle aches  . Amiodarone Hcl     REACTION: Nausea/Vomiting; decrease appetite; affects liver enzymes  . Aspirin   . Diltiazem Hcl     REACTION: Nausea/vomitting  . Flecainide Acetate     REACTION: Intol: nausea/horrible feeling  . Metoprolol Succinate     REACTION: Nausea/vomiting;dizziness  . Sotalol Hcl     REACTION: Nausea/vomiting    Current Outpatient Prescriptions  Medication Sig Dispense Refill  . acetaminophen (TYLENOL) 500 MG tablet Take 500 mg by mouth every 4  (four) hours as needed.       . digoxin (LANOXIN) 0.125 MG tablet Take 125 mcg by mouth at bedtime.      . furosemide (LASIX) 20 MG tablet Take 1 tablet (20 mg total) by mouth daily.  90 tablet  3  . prazosin (MINIPRESS) 2 MG capsule Take 1 capsule (2 mg total) by mouth 2 (two) times daily.  180 capsule  3  . propranolol (INDERAL) 10 MG tablet Take 1 tablet (10 mg total) by mouth daily. 10 MG ONCE IN THE MORNING  90 tablet  3  . propranolol ER (INDERAL LA) 160 MG SR capsule Take 160 mg by mouth at bedtime.      Marland Kitchen spironolactone (ALDACTONE) 12.5 mg TABS Take 12.5 mg by mouth every morning.      . warfarin (COUMADIN) 5 MG tablet Take 5 mg by mouth daily. MON,WED,FRI  PT TAKE 5 MG ON SUN,TUES,THURS,SAT PT TAKES 2.5       No current facility-administered medications for this visit.       Review of Systems:     Cardiac Review of Systems: Y or N  Chest Pain [  n  ]  Resting SOB [n  ] Exertional SOB  Cove.Etienne  ]  Orthopnea [ n ]   Pedal Edema Cove.Etienne   ]    Palpitations [ y ] Syncope  [n  ]   Presyncope [ n  ]  General Review of Systems: [Y] = yes [  ]=no Constitional: recent weight change [ n ]; anorexia [  ]; fatigue [  ]; nausea [  ]; night sweats [n  ]; fever [n]; or chills [ n ];                                                                                                                                          Dental: poor dentition[n  ]; Last Dentist visit:   Eye : blurred vision [  ]; diplopia [   ]; vision changes [  ];  Amaurosis  fugax[  ]; Resp: cough [  ];  wheezing[ n ];  hemoptysis[n  ]; shortness of breath[ y ]; paroxysmal nocturnal dyspnea[n  ]; dyspnea on exertion[n  ]; or orthopnea[  ];  GI:  gallstones[  ], vomiting[  ];  dysphagia[  ]; melena[  ];  hematochezia [  ]; heartburn[  ];   Hx of  Colonoscopy[  ]; GU: kidney stones [  ]; hematuria[  ];   dysuria [  ];  nocturia[  ];  history of     obstruction [  ]; urinary frequency [  ]             Skin: rash, swelling[  ];, hair loss[  ];   peripheral edema[  ];  or itching[  ]; Musculosketetal: myalgias[  ];  joint swelling[  ];  joint erythema[  ];  joint pain[  ];  back pain[  ];  Heme/Lymph: bruising[  ];  bleeding[  ];  anemia[  ];  Neuro: TIA[  ];  headaches[  ];  stroke[  ];  vertigo[  ];  seizures[  ];   paresthesias[  ];  difficulty walking[  ];  Psych:depression[  ]; anxiety[  ];  Endocrine: diabetes[ n ];  thyroid dysfunction[  ];  Immunizations: Flu Cove.Etienne  ]; Pneumococcal[ y ];  Other: Told she had end-stage renal disease at age 25 but has never had any problems since  Physical Exam: BP 111/71  Pulse 71  Resp 16  Ht 5\' 4"  (1.626 m)  Wt 132 lb (59.875 kg)  BMI 22.65 kg/m2  SpO2 98%  Physical Examination: Physical Examination: General appearance - alert, well appearing, and in no distress, oriented to person, place, and time and normal appearing weight Mental status - alert, oriented to person, place, and time, anxious Neck - supple, no significant adenopathy, carotids upstroke normal bilaterally, no bruits Lymphatics - no palpable lymphadenopathy, no hepatosplenomegaly Chest - clear to auscultation, no wheezes, rales or rhonchi, symmetric air entry Heart - irregularly irregular rhythm with rate 70, systolic murmur 3/6 at lower left sternal border, diastolic murmur 3/6 at lower left sternal border Abdomen - soft, nontender, nondistended, no masses or organomegaly no rebound tenderness noted no abdominal bruits no pulsatile masses no CVA tenderness no inguinal adenopathy Neurological - alert, oriented, normal speech, no focal findings or movement disorder noted, motor and sensory grossly normal bilaterally, normal muscle tone, no tremors, strength 5/5 Musculoskeletal - no joint tenderness, deformity or swelling Extremities - peripheral pulses normal, no pedal edema, no clubbing or cyanosis, varicose veins noted, venous stasis dermatitis noted Skin - normal coloration and turgor, no rashes, no suspicious skin  lesions noted Venous stasis changes in the large comes 2+ femoral pulses bilaterally    Diagnostic Studies & Laboratory data:     Recent Radiology Findings:  Dg Chest 2 View  08/22/2012  *RADIOLOGY REPORT*  Clinical Data: 79 year old female with shortness of breath, atrial fibrillation.  CHEST - 2 VIEW  Comparison: 01/04/2009 and earlier.  Findings: Chronic ectasia and aneurysmal enlargement of the descending thoracic aorta has progressed since 2010.  Maximum descending thoracic aorta diameter now as much as 8 cm (approximately 5-6 cm in 2010).  The vessel appears aneurysmal along the approximate 10 cm segment on the lateral view (not significantly changed).  Chronic cardiomegaly.  Other mediastinal contours are stable. Visualized tracheal air column is within normal limits.  No pneumothorax, pulmonary edema, pleural effusion or acute pulmonary opacity. Stable visualized osseous structures.  IMPRESSION: 1.  Progression of a chronic descending thoracic aortic aneurysm since 2010, now up to 8 cm diameter (versus 5-6 cm in 2010). 2.  Stable cardiomegaly.  No acute pulmonary process identified.  Impression #1 discussed by telephone with RN Lauren at Dr. Thurston Hole office at the time of dictation.   Original Report Authenticated By: Erskine Speed, M.D.    Ct Angio Chest Aorta W/cm &/or Wo/cm  09/03/2012  *RADIOLOGY REPORT*  Clinical Data:  Aortic aneurysm.  CT ANGIOGRAPHY CHEST, ABDOMEN AND PELVIS  Technique:  Multidetector CT imaging through the chest, abdomen and pelvis was performed using the standard protocol during bolus administration of intravenous contrast.  Multiplanar reconstructed images including MIPs were obtained and reviewed to evaluate the vascular anatomy.  Contrast: OMNIPAQUE IOHEXOL 300 MG/ML  SOLN,  Comparison:   None.  CTA CHEST  Findings:  Borderline aneurysmal dilatation of the ascending aorta. Maximal diameters of the ascending aorta at the sinus of Valsalva, sinotubular junction, and  ascending aorta are 3.8 cm, 3.6 cm, and 4.0 cm respectively.  The proximal descending aorta is nonaneurysmal.  Approximately 9 cm beyond the origin of the descending aorta aneurysmal dilatation develops measuring up to 6.1 cm in maximal diameter.  There is chronic appearing mural thrombus posteriorly and laterally to the left.  The aorta assumes a more normal caliber in the distal descending aorta and then becomes ectatic again at the diaphragmatic hiatus.  Focal dissection occurs in the right posterolateral region of the aorta at the diaphragmatic hiatus.  It does not appear flow limiting.  Atherosclerotic calcifications of the aortic arch, ascending aorta, and descending aorta are present.  Minimal calcifications in the aortic valve.  Left main coronary artery calcification.  Three- vessel coronary artery calcifications are present.  Atherosclerotic changes at the origins of the great vessels are noted.  Hot no significant narrowing involving the innominate artery, hot right subclavian artery, right common carotid artery, left common carotid artery, and left subclavian artery.  Bilateral vertebral arteries are also grossly patent.  There are no obvious filling defects in the pulmonary arterial tree to suggest acute pulmonary thromboembolism.  Negative abnormal mediastinal adenopathy.  Thyroid gland is within normal limits.  No pneumothorax.  No pericardial effusion.  Small left pleural effusion is present and layering.  No vertebral compression deformity.   Review of the MIP images confirms the above findings.  IMPRESSION: Aneurysmal dilatation of the descending thoracic aorta as described measuring up to 6.1 cm.  CTA ABDOMEN AND PELVIS  Findings:  At the diaphragmatic hiatus, the abdominal aorta is 3.9 cm in diameter.  A focal dissection in the right posterolateral location is noted which does not appear flow limiting. Atherosclerotic faster calcifications in the aorta are noted within the abdomen.  There  remainder of the aorta is nonaneurysmal.  There is significant narrowing at the origin of the celiac artery, between 50-60%.  Branch vessels are patent.  Accessory left hepatic artery is noted.  SMA is patent.  Branch vessels are grossly patent.  Left renal artery is tortuous and patent in mild atherosclerotic changes at its origin.  Right renal artery is patent.  Mild atherosclerotic changes at its origin.  The infrarenal aorta is mildly tortuous.  Atherosclerotic changes are present with mild anterolateral plaque towards the left on image 115.  Right common and external iliac arteries are tortuous and patent with mild atherosclerotic changes.  Left common iliac artery is patent with moderate atherosclerotic changes.  Left external iliac artery  is patent.  Mild disease of the internal iliac arteries without significant narrowing.  The IMA is diminutive with moderate disease at its origin.  Branch vessels are grossly patent.  Sub centimeter hypodensity in the left kidney on image 8 of series 9 is nonspecific.  Liver, gallbladder, spleen, adrenal glands, and pancreas are within normal limits.  Trace free fluid in the pelvis is nonspecific.  Uterus and adnexa are within normal limits.  Bladder is within normal limits.  No obvious abnormal adenopathy.  Advanced degenerative changes in the lumbar spine are present. Severe narrowing at L4-5 is accompanied by 6 mm anterolisthesis L4 upon L5.  There is associated marked facet arthropathy.  6 mm and anterolisthesis L5 upon S1.  An element of spinal stenosis is suspected at L4-5.   Review of the MIP images confirms the above findings.  IMPRESSION: Mild aneurysmal dilatation of the aorta at the diaphragmatic hiatus.  The infrarenal aorta is nonaneurysmal with mild atherosclerotic plaque.  Significant narrowing at the origin of the celiac axis.  SMA is patent.  Advanced degenerative change in the lumbar spine.   Original Report Authenticated By: Jolaine Click, M.D.    Ct Angio  Abd/pel W/ And/or W/o  09/03/2012  *RADIOLOGY REPORT*  Clinical Data:  Aortic aneurysm.  CT ANGIOGRAPHY CHEST, ABDOMEN AND PELVIS  Technique:  Multidetector CT imaging through the chest, abdomen and pelvis was performed using the standard protocol during bolus administration of intravenous contrast.  Multiplanar reconstructed images including MIPs were obtained and reviewed to evaluate the vascular anatomy.  Contrast: OMNIPAQUE IOHEXOL 300 MG/ML  SOLN,  Comparison:   None.  CTA CHEST  Findings:  Borderline aneurysmal dilatation of the ascending aorta. Maximal diameters of the ascending aorta at the sinus of Valsalva, sinotubular junction, and ascending aorta are 3.8 cm, 3.6 cm, and 4.0 cm respectively.  The proximal descending aorta is nonaneurysmal.  Approximately 9 cm beyond the origin of the descending aorta aneurysmal dilatation develops measuring up to 6.1 cm in maximal diameter.  There is chronic appearing mural thrombus posteriorly and laterally to the left.  The aorta assumes a more normal caliber in the distal descending aorta and then becomes ectatic again at the diaphragmatic hiatus.  Focal dissection occurs in the right posterolateral region of the aorta at the diaphragmatic hiatus.  It does not appear flow limiting.  Atherosclerotic calcifications of the aortic arch, ascending aorta, and descending aorta are present.  Minimal calcifications in the aortic valve.  Left main coronary artery calcification.  Three- vessel coronary artery calcifications are present.  Atherosclerotic changes at the origins of the great vessels are noted.  Hot no significant narrowing involving the innominate artery, hot right subclavian artery, right common carotid artery, left common carotid artery, and left subclavian artery.  Bilateral vertebral arteries are also grossly patent.  There are no obvious filling defects in the pulmonary arterial tree to suggest acute pulmonary thromboembolism.  Negative abnormal  mediastinal adenopathy.  Thyroid gland is within normal limits.  No pneumothorax.  No pericardial effusion.  Small left pleural effusion is present and layering.  No vertebral compression deformity.   Review of the MIP images confirms the above findings.  IMPRESSION: Aneurysmal dilatation of the descending thoracic aorta as described measuring up to 6.1 cm.  CTA ABDOMEN AND PELVIS  Findings:  At the diaphragmatic hiatus, the abdominal aorta is 3.9 cm in diameter.  A focal dissection in the right posterolateral location is noted which does not appear flow limiting. Atherosclerotic  faster calcifications in the aorta are noted within the abdomen.  There remainder of the aorta is nonaneurysmal.  There is significant narrowing at the origin of the celiac artery, between 50-60%.  Branch vessels are patent.  Accessory left hepatic artery is noted.  SMA is patent.  Branch vessels are grossly patent.  Left renal artery is tortuous and patent in mild atherosclerotic changes at its origin.  Right renal artery is patent.  Mild atherosclerotic changes at its origin.  The infrarenal aorta is mildly tortuous.  Atherosclerotic changes are present with mild anterolateral plaque towards the left on image 115.  Right common and external iliac arteries are tortuous and patent with mild atherosclerotic changes.  Left common iliac artery is patent with moderate atherosclerotic changes.  Left external iliac artery is patent.  Mild disease of the internal iliac arteries without significant narrowing.  The IMA is diminutive with moderate disease at its origin.  Branch vessels are grossly patent.  Sub centimeter hypodensity in the left kidney on image 8 of series 9 is nonspecific.  Liver, gallbladder, spleen, adrenal glands, and pancreas are within normal limits.  Trace free fluid in the pelvis is nonspecific.  Uterus and adnexa are within normal limits.  Bladder is within normal limits.  No obvious abnormal adenopathy.  Advanced  degenerative changes in the lumbar spine are present. Severe narrowing at L4-5 is accompanied by 6 mm anterolisthesis L4 upon L5.  There is associated marked facet arthropathy.  6 mm and anterolisthesis L5 upon S1.  An element of spinal stenosis is suspected at L4-5.   Review of the MIP images confirms the above findings.  IMPRESSION: Mild aneurysmal dilatation of the aorta at the diaphragmatic hiatus.  The infrarenal aorta is nonaneurysmal with mild atherosclerotic plaque.  Significant narrowing at the origin of the celiac axis.  SMA is patent.  Advanced degenerative change in the lumbar spine.   Original Report Authenticated By: Jolaine Click, M.D.     Recent Lab Findings: Lab Results  Component Value Date   WBC 8.7 01/04/2009   HGB 14.9 01/04/2009   HCT 44.8 01/04/2009   PLT 204 01/04/2009   GLUCOSE 106* 08/18/2012   CHOL 204* 08/18/2012   TRIG 54.0 08/18/2012   HDL 54.80 08/18/2012   LDLDIRECT 127.6 08/18/2012   LDLCALC 100* 10/05/2010   ALT 25 08/18/2012   AST 29 08/18/2012   NA 141 08/18/2012   K 4.6 08/18/2012   CL 105 08/18/2012   CREATININE 1.0 08/18/2012   BUN 26* 08/18/2012   CO2 30 08/18/2012   TSH 1.382 Test methodology is 3rd generation TSH 01/05/2009   INR 2.3 08/21/2012    Lower Extremity Arterial Evaluation  Patient: [name] [name], [name] [name] MR #: [id number] Study Date: 09/10/2012 Gender: F Age: 40 Height: Weight: BSA: Pt. Status: Room:  ATTENDING Elliot Cousin SONOGRAPHER Farrel Demark, RVT, RDMS Reports also to:  ------------------------------------------------------------ History and indications:  History  Pre-op evaluation Aortic valve disorders  ------------------------------------------------------------ Study information:  Study status: Routine. Procedure: A vascular evaluation was performed. Image quality was adequate. ABI. Segmental pressure measurements, ankle-brachial indices, and photoplethysmography. Location: Vascular laboratory. Patient status:  Outpatient.  Brachial pressures:  +--------+-----+----+---+  RightLeftMax +--------+-----+----+---+ Systolic138 130 138 +--------+-----+----+---+ Arterial pressure indices:  +-----------------+--------+--------------+---------+ Location PressureBrachial indexWaveform  +-----------------+--------+--------------+---------+ Right ant tibial Hg0.99 Biphasic  +-----------------+--------+--------------+---------+ Right post tibial121mm Hg1.01 Triphasic +-----------------+--------+--------------+---------+ Left ant tibial Hg0.86 Biphasic  +-----------------+--------+--------------+---------+ Left post tibial Hg0.93 Triphasic +-----------------+--------+--------------+---------+ Normal ABI study.  ------------------------------------------------------------ Summary: Other  specific details can be found in the table(s) above. Prepared and Electronically Authenticated by  Sherren Kerns 2014-02-26T16:08:[id number]   ECHO: Echocardiography  Patient: [name] [name], [name] [name] MR #: [id number] Study Date: 09/10/2012 Gender: F Age: 71 Height: 162.6cm Weight: 61.7kg BSA: 1.17m^2 Pt. Status: Room:  PERFORMING [place], [facility] ATTENDING Elliot Cousin SONOGRAPHER Melissa Morford, RDCS cc:  ------------------------------------------------------------ LV EF: 50% - 55%  ------------------------------------------------------------ Indications: Aortic insufficiency 424.1.  ------------------------------------------------------------ History: PMH: 6 CM Thoracic Aortic Aneursym Dyspnea. Atrial fibrillation. Congestive heart failure.  ------------------------------------------------------------ Study Conclusions  - Left ventricle: The cavity size was normal. Wall thickness was increased in a pattern of mild LVH. Systolic function was normal. The estimated ejection fraction was in the range of 50% to  55%. - Aortic valve: Mild regurgitation. - Mitral valve: Moderate regurgitation. - Left atrium: The atrium was moderately dilated. - Right atrium: The atrium was moderately dilated. - Tricuspid valve: Moderate regurgitation. - Pulmonary arteries: Systolic pressure was moderately increased. PA peak pressure: 55mm Hg (S). Echocardiography. M-mode, complete 2D, spectral Doppler, and color Doppler. Height: Height: 162.6cm. Height: 64in. Weight: Weight: 61.7kg. Weight: 135.7lb. Body mass index: BMI: 23.3kg/m^2. Body surface area: BSA: 1.67m^2. Blood pressure: 102/66. Patient status: Outpatient. Location: Echo laboratory.  ------------------------------------------------------------  ------------------------------------------------------------ Left ventricle: The cavity size was normal. Wall thickness was increased in a pattern of mild LVH. Systolic function was normal. The estimated ejection fraction was in the range of 50% to 55%.  ------------------------------------------------------------ Aortic valve: Doppler: There was no stenosis. Mild regurgitation.  ------------------------------------------------------------ Aorta: Ascending aorta: The ascending aorta was mildly dilated.  ------------------------------------------------------------ Mitral valve: Doppler: Moderate regurgitation.  ------------------------------------------------------------ Left atrium: The atrium was moderately dilated.  ------------------------------------------------------------ Right ventricle: Systolic function was normal.  ------------------------------------------------------------ Pulmonic valve: The valve appears to be grossly normal. Doppler: Trivial regurgitation.  ------------------------------------------------------------ Tricuspid valve: Doppler: Moderate regurgitation.  ------------------------------------------------------------ Pulmonary artery: Systolic pressure was  moderately increased.  ------------------------------------------------------------ Right atrium: The atrium was moderately dilated.  ------------------------------------------------------------ Pericardium: There was no pericardial effusion.  ------------------------------------------------------------  2D measurements Normal Doppler Normal Left ventricle measurements LVID ED, 43.1 mm 43-52 Main pulmonary chord, artery PLAX Pressure, S 55 mm =30 LVID ES, 37.8 mm 23-38 Hg chord, Left ventricle PLAX Ea, lat 9.1 cm/ ------- FS, chord, 12 % >29 ann, tiss s PLAX DP LVPW, ED 12 mm ------ E/Ea, lat 7.76 ------- IVS/LVPW 0.88 <1.3 ann, tiss ratio, ED DP Ventricular septum Ea, med 5.81 cm/ ------- IVS, ED 10.6 mm ------ ann, tiss s Aorta DP Root diam, 37 mm ------ E/Ea, med 12.15 ------- ED ann, tiss Left atrium DP AP dim 49 mm ------ LVOT AP dim 2.92 cm/m^2 <2.2 Peak vel, S 133 cm/ ------- index s VTI, S 27.3 cm ------- Peak 7 mm ------- gradient, S Hg Aortic valve Regurg PHT 746 ms ------- Mitral valve Peak E vel 70.6 cm/ ------- s Peak A vel 36 cm/ ------- s Deceleratio 176 ms 150-230 n time Peak E/A 2 ------- ratio Tricuspid valve Regurg peak 337 cm/ ------- vel s Peak RV-RA 45 mm ------- gradient, S Hg Systemic veins Estimated 10 mm ------- CVP Hg Right ventricle Pressure, S 55 mm <30 Hg Sa vel, lat 9.98 cm/ ------- ann, tiss s DP  ------------------------------------------------------------ Prepared and Electronically Authenticated by  Kristeen Miss 2014-02-26T16:46:35.297   Assessment / Plan:     #1 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 With the current size I recommended to her that she consider placement of thoracic stent graft to prevent further enlargement and potential rupture of the descending thoracic aorta. The risks and options of medical treatment versus surgical treatment have been reviewed in detail. She is aware of the risk of spinal cord ischemia and paraplegia with any intervention. I discussed with her that with the size of the aorta currently the risk of rupture is increasing exponentially.   The patient has discussed the treatment options with her daughters further questions were answered and she is willing to proceed but would like to wait until the daughters are available the week of March 17 to the 21st. She will see Dr. Myra Gianotti also from vascular surgery for his input into her vascular issues.    Delight Ovens MD  Beeper [phone] Office [phone] 09/11/2012 11:01 AM

## 2012-09-12 ENCOUNTER — Encounter: Payer: Self-pay | Admitting: Surgery

## 2012-09-13 LAB — ALKALINE PHOSPHATASE ISOENZYMES
Alk Phos Intestinal Calc: 7 U/L (ref <15)
Liver %: 75 % (ref 44–84)

## 2012-09-15 ENCOUNTER — Encounter: Payer: Self-pay | Admitting: Surgery

## 2012-09-15 ENCOUNTER — Other Ambulatory Visit (INDEPENDENT_AMBULATORY_CARE_PROVIDER_SITE_OTHER): Payer: Medicare Other

## 2012-09-15 ENCOUNTER — Ambulatory Visit (INDEPENDENT_AMBULATORY_CARE_PROVIDER_SITE_OTHER): Payer: Medicare Other | Admitting: Surgery

## 2012-09-15 VITALS — BP 129/67 | HR 49 | Ht 64.0 in | Wt 131.9 lb

## 2012-09-15 DIAGNOSIS — I714 Abdominal aortic aneurysm, without rupture, unspecified: Secondary | ICD-10-CM | POA: Insufficient documentation

## 2012-09-15 DIAGNOSIS — I6529 Occlusion and stenosis of unspecified carotid artery: Secondary | ICD-10-CM | POA: Diagnosis not present

## 2012-09-15 NOTE — Progress Notes (Signed)
[facility] [place]   Patient name: [name] [name] MRN: [id number] DOB: 07-13-1932 Sex: female   Referred by: Dr. Tyrone Sage  Reason for referral:  Chief Complaint  Patient presents with  . New Evaluation    6 cm TAA - Dr. Tyrone Sage    HISTORY OF PRESENT ILLNESS: This is a very pleasant 79 year old female that I am seeing for evaluation of a descending thoracic aortic aneurysm. This was incidentally discovered during a workup with a CT scan. She denies any chest or back pain. She has a history of hypertension, however she states this has been well controlled. Her average blood pressure is in the 118/70 range. She suffers from hypercholesterolemia but stop her statins secondary to muscle weakness. Her biggest medical problem has been atrial fibrillation. She has undergone ablation in the past, however it returned. She is now managed with digoxin and Coumadin. She has a history of smoking but quit approximately 25 years ago.  Past Medical History  Diagnosis Date  . AF (atrial fibrillation)   . Hypertension   . Aortic insufficiency   . Mitral regurgitation   . Tricuspid regurgitation   . CHF (congestive heart failure)   . Aortic aneurysm   . CAD (coronary artery disease)     Past Surgical History  Procedure Laterality Date  . Tonsillectomy    . Cystectomy      sacrum    History   Social History  . Marital Status: Widowed    Spouse Name: N/A    Number of Children: 3  . Years of Education: N/A   Occupational History  . Holiday representative    Social History Main Topics  . Smoking status: Former Smoker -- 1.00 packs/day for 20 years    Types: Cigarettes    Quit date: 08/22/1985  . Smokeless tobacco: Never Used  . Alcohol Use: 4.2 oz/week    7 Glasses of wine per week     Comment: a glass of wine every night  . Drug Use: No  . Sexually Active: Not on file   Other Topics Concern  . Not on file   Social History Narrative  . No narrative on file     Family History  Problem Relation Age of Onset  . Aneurysm Mother 69  . Cancer Mother     breast  . Heart disease Mother   . Hypertension Mother   . Hodgkin's lymphoma Father 3  . Cancer Father     hodgkins    Allergies as of 09/15/2012 - Review Complete 09/15/2012  Allergen Reaction Noted  . Atorvastatin  11/15/2010  . Amiodarone hcl    . Aspirin  11/08/2010  . Diltiazem hcl    . Flecainide acetate    . Metoprolol succinate    . Sotalol hcl      Current Outpatient Prescriptions on File Prior to Visit  Medication Sig Dispense Refill  . acetaminophen (TYLENOL) 500 MG tablet Take 500 mg by mouth every 4 (four) hours as needed.       . digoxin (LANOXIN) 0.125 MG tablet Take 125 mcg by mouth at bedtime.      . furosemide (LASIX) 20 MG tablet Take 1 tablet (20 mg total) by mouth daily.  90 tablet  3  . prazosin (MINIPRESS) 2 MG capsule Take 1 capsule (2 mg total) by mouth 2 (two) times daily.  180 capsule  3  . propranolol (INDERAL) 10 MG tablet Take 1 tablet (10 mg total) by mouth daily.  10 MG ONCE IN THE MORNING  90 tablet  3  . propranolol ER (INDERAL LA) 160 MG SR capsule Take 160 mg by mouth at bedtime.      Marland Kitchen spironolactone (ALDACTONE) 12.5 mg TABS Take 12.5 mg by mouth every morning.      . warfarin (COUMADIN) 5 MG tablet Take 5 mg by mouth daily. MON,WED,FRI  PT TAKE 5 MG ON SUN,TUES,THURS,SAT PT TAKES 2.5       No current facility-administered medications on file prior to visit.     REVIEW OF SYSTEMS: Cardiovascular: Positive for palpitations, shortness of breath with exertion, pain in legs with walking. Pulmonary: No productive cough, asthma or wheezing. Neurologic: No weakness, paresthesias, aphasia, or amaurosis. No dizziness. Hematologic: No bleeding problems or clotting disorders. Musculoskeletal: No joint pain or joint swelling. Gastrointestinal: No blood in stool or hematemesis Genitourinary: No dysuria or hematuria. Psychiatric:: No history of major  depression. Integumentary: No rashes or ulcers. Constitutional: No fever or chills.  PHYSICAL EXAMINATION: General: The patient appears their stated age.  Vital signs are BP 129/67  Pulse 49  Ht 5\' 4"  (1.626 m)  Wt 131 lb 14.4 oz (59.829 kg)  BMI 22.63 kg/m2  SpO2 98% HEENT:  No gross abnormalities Pulmonary: Respirations are non-labored Abdomen: Soft and non-tender  Musculoskeletal: There are no major deformities.   Neurologic: No focal weakness or paresthesias are detected, Skin: There are no ulcer or rashes noted. Psychiatric: The patient has normal affect. Cardiovascular: There is a regular rate and rhythm without significant murmur appreciated. No carotid bruits. Palpable pedal pulses.  Diagnostic Studies: I have reviewed her CT scans which show a 6 cm descending thoracic aortic aneurysm  Carotid ultrasound was ordered today which is no significant carotid stenosis  Outside Studies/Documentation Historical records were reviewed.  They showed TAAA  Medication Changes: Need to be off Coumadin with lovenox bridge  Assessment:  Descending thoracic aortic aneurysm Plan: Had lengthy discussion with the patient regarding treatment options. Because of the size of her aneurysm, I feel that this is at risk for rupture, and it should be repaired. I feel that she would be a good candidate for endovascular repair. We talked about the risks and benefits including the risk of paralysis and spinal cord ischemia. We also discussed the potential access site complications and the need for a potential iliac conduit. However, after reviewing her images I would recommend a percutaneous femoral approach. Timing of her operation will be coordinated with Dr. Tyrone Sage.  The patient is worried about coming off her Coumadin and therefore I have recommended a Lovenox bridge.     Jorge Ny, M.D. [facility] [place] Office: [phone] Pager:  [phone]

## 2012-09-16 ENCOUNTER — Encounter: Payer: Self-pay | Admitting: *Deleted

## 2012-09-16 ENCOUNTER — Other Ambulatory Visit (HOSPITAL_COMMUNITY): Payer: Medicare Other

## 2012-09-16 ENCOUNTER — Other Ambulatory Visit: Payer: Self-pay | Admitting: *Deleted

## 2012-09-16 ENCOUNTER — Encounter: Payer: Self-pay | Admitting: Internal Medicine

## 2012-09-17 ENCOUNTER — Other Ambulatory Visit: Payer: Self-pay

## 2012-09-17 DIAGNOSIS — I712 Thoracic aortic aneurysm, without rupture, unspecified: Secondary | ICD-10-CM

## 2012-09-17 DIAGNOSIS — Z01810 Encounter for preprocedural cardiovascular examination: Secondary | ICD-10-CM

## 2012-09-17 NOTE — Progress Notes (Signed)
Lab results were discussed with Dr. Elnoria Howard and he recommended getting further blood work. We will get ANA, antimitochondrial antibody, ASMA, alpha-1 antitrypsin, iron and ferritin

## 2012-09-18 ENCOUNTER — Encounter (HOSPITAL_COMMUNITY): Payer: Self-pay | Admitting: Pharmacy Technician

## 2012-09-18 ENCOUNTER — Other Ambulatory Visit: Payer: Medicare Other

## 2012-09-18 DIAGNOSIS — IMO0002 Reserved for concepts with insufficient information to code with codable children: Secondary | ICD-10-CM

## 2012-09-18 DIAGNOSIS — R748 Abnormal levels of other serum enzymes: Secondary | ICD-10-CM | POA: Diagnosis not present

## 2012-09-19 ENCOUNTER — Other Ambulatory Visit: Payer: Medicare Other

## 2012-09-23 ENCOUNTER — Telehealth: Payer: Self-pay | Admitting: Cardiovascular Disease

## 2012-09-23 LAB — MITOCHONDRIAL/SMOOTH MUSCLE AB PNL
Mitochondrial M2 Ab, IgG: 0.42 (ref <0.91)
Smooth Muscle Ab: 15 U (ref <20)

## 2012-09-23 NOTE — Telephone Encounter (Signed)
Called pt advised Dr Melburn Popper wants pt bridged with Lovenox prior to procedure on 10/02/12.  Pt has never done Lovenox before.  Cancelled f/u [facility] appt on 10/02/12 and r/s to 09/26/12 for Lovenox dosing and teaching at appt.  Pt's last dosage of Coumadin will be on 09/26/12.  Pt aware of appt date and time.

## 2012-09-23 NOTE — Telephone Encounter (Signed)
Pt having surgery next week, was told by surgeon dr Elease Hashimoto would contact her re coming off coumadin and bridging with lovanox, also has a coumadin appt the day of surgery, does she rs that to before or after surgery? pls call  [phone] or cell [phone]

## 2012-09-23 NOTE — Telephone Encounter (Signed)
Per Dr. Harvie Bridge chart review patient may be bridged in preparation for AAA repair.  Will forward to [facility].

## 2012-09-24 ENCOUNTER — Encounter (HOSPITAL_COMMUNITY)
Admission: RE | Admit: 2012-09-24 | Discharge: 2012-09-24 | Disposition: A | Discharge status: Home / Self Care | Payer: Medicare Other | Source: Ambulatory Visit | Attending: Surgery | Admitting: Surgery

## 2012-09-24 ENCOUNTER — Encounter (HOSPITAL_COMMUNITY): Payer: Self-pay

## 2012-09-24 DIAGNOSIS — Z7901 Long term (current) use of anticoagulants: Secondary | ICD-10-CM | POA: Diagnosis not present

## 2012-09-24 DIAGNOSIS — R188 Other ascites: Secondary | ICD-10-CM | POA: Diagnosis not present

## 2012-09-24 DIAGNOSIS — I059 Rheumatic mitral valve disease, unspecified: Secondary | ICD-10-CM | POA: Diagnosis present

## 2012-09-24 DIAGNOSIS — T82898A Other specified complication of vascular prosthetic devices, implants and grafts, initial encounter: Secondary | ICD-10-CM | POA: Diagnosis not present

## 2012-09-24 DIAGNOSIS — Z79899 Other long term (current) drug therapy: Secondary | ICD-10-CM | POA: Diagnosis not present

## 2012-09-24 DIAGNOSIS — I498 Other specified cardiac arrhythmias: Secondary | ICD-10-CM | POA: Diagnosis present

## 2012-09-24 DIAGNOSIS — R609 Edema, unspecified: Secondary | ICD-10-CM | POA: Diagnosis not present

## 2012-09-24 DIAGNOSIS — I712 Thoracic aortic aneurysm, without rupture, unspecified: Secondary | ICD-10-CM | POA: Diagnosis present

## 2012-09-24 DIAGNOSIS — I509 Heart failure, unspecified: Secondary | ICD-10-CM | POA: Diagnosis not present

## 2012-09-24 DIAGNOSIS — I4891 Unspecified atrial fibrillation: Secondary | ICD-10-CM | POA: Diagnosis not present

## 2012-09-24 DIAGNOSIS — I359 Nonrheumatic aortic valve disorder, unspecified: Secondary | ICD-10-CM | POA: Diagnosis not present

## 2012-09-24 DIAGNOSIS — I10 Essential (primary) hypertension: Secondary | ICD-10-CM | POA: Diagnosis not present

## 2012-09-24 DIAGNOSIS — J90 Pleural effusion, not elsewhere classified: Secondary | ICD-10-CM | POA: Diagnosis not present

## 2012-09-24 DIAGNOSIS — I2510 Atherosclerotic heart disease of native coronary artery without angina pectoris: Secondary | ICD-10-CM | POA: Diagnosis not present

## 2012-09-24 DIAGNOSIS — T85698A Other mechanical complication of other specified internal prosthetic devices, implants and grafts, initial encounter: Secondary | ICD-10-CM | POA: Diagnosis not present

## 2012-09-24 DIAGNOSIS — Z5189 Encounter for other specified aftercare: Secondary | ICD-10-CM | POA: Diagnosis not present

## 2012-09-24 DIAGNOSIS — I079 Rheumatic tricuspid valve disease, unspecified: Secondary | ICD-10-CM | POA: Diagnosis not present

## 2012-09-24 DIAGNOSIS — J9819 Other pulmonary collapse: Secondary | ICD-10-CM | POA: Diagnosis not present

## 2012-09-24 DIAGNOSIS — I061 Rheumatic aortic insufficiency: Secondary | ICD-10-CM | POA: Diagnosis not present

## 2012-09-24 HISTORY — DX: Claustrophobia: F40.240

## 2012-09-24 HISTORY — DX: Shortness of breath: R06.02

## 2012-09-24 HISTORY — DX: Unspecified osteoarthritis, unspecified site: M19.90

## 2012-09-24 LAB — COMPREHENSIVE METABOLIC PANEL
ALT: 19 U/L (ref 0–35)
AST: 25 U/L (ref 0–37)
Albumin: 4.1 g/dL (ref 3.5–5.2)
Alkaline Phosphatase: 194 U/L — ABNORMAL HIGH (ref 39–117)
Chloride: 99 mEq/L (ref 96–112)
Potassium: 4 mEq/L (ref 3.5–5.1)
Sodium: 137 mEq/L (ref 135–145)
Total Protein: 7.5 g/dL (ref 6.0–8.3)

## 2012-09-24 LAB — CBC
MCHC: 34.1 g/dL (ref 30.0–36.0)
RDW: 13.1 % (ref 11.5–15.5)
WBC: 6.9 10*3/uL (ref 4.0–10.5)

## 2012-09-24 LAB — SURGICAL PCR SCREEN: MRSA, PCR: NEGATIVE

## 2012-09-24 LAB — BLOOD GAS, ARTERIAL
Bicarbonate: 24.6 mEq/L — ABNORMAL HIGH (ref 20.0–24.0)
FIO2: 0.21 %
Patient temperature: 98.6
pCO2 arterial: 36.3 mmHg (ref 35.0–45.0)
pH, Arterial: 7.445 (ref 7.350–7.450)

## 2012-09-24 LAB — URINE MICROSCOPIC-ADD ON

## 2012-09-24 LAB — URINALYSIS, ROUTINE W REFLEX MICROSCOPIC
Hgb urine dipstick: NEGATIVE
Protein, ur: NEGATIVE mg/dL
Urobilinogen, UA: 0.2 mg/dL (ref 0.0–1.0)

## 2012-09-24 NOTE — Progress Notes (Signed)
Hx AF.  Will give cardiac info to [facility] for review

## 2012-09-24 NOTE — Pre-Procedure Instructions (Signed)
[name] [name]  09/24/2012   Your procedure is scheduled on:  10/02/12  Report to [facility] at 530 AM.  Call this number if you have problems the morning of surgery: [phone]   Remember:   Do not eat food or drink liquids after midnight.   Take these medicines the morning of surgery with A SIP OF WATER: lanoxin,parosin,inderal,aldactone   Do not wear jewelry, make-up or nail polish.  Do not wear lotions, powders, or perfumes. You may wear deodorant.  Do not shave 48 hours prior to surgery. Men may shave face and neck.  Do not bring valuables to the hospital.  Contacts, dentures or bridgework may not be worn into surgery.  Leave suitcase in the car. After surgery it may be brought to your room.  For patients admitted to the hospital, checkout time is 11:00 AM the day of  discharge.   Patients discharged the day of surgery will not be allowed to drive  home.  Name and phone number of your driver: family  Special Instructions: Shower using CHG 2 nights before surgery and the night before surgery.  If you shower the day of surgery use CHG.  Use special wash - you have one bottle of CHG for all showers.  You should use approximately 1/3 of the bottle for each shower.   Please read over the following fact sheets that you were given: Pain Booklet, Coughing and Deep Breathing, Blood Transfusion Information, MRSA Information and Surgical Site Infection Prevention

## 2012-09-25 ENCOUNTER — Telehealth: Payer: Self-pay | Admitting: *Deleted

## 2012-09-25 NOTE — Progress Notes (Addendum)
Anesthesia chart review: Patient is a 79 year old female scheduled for endovascular stent repair of descending thoracic aortic aneurysm by Dr. Myra Gianotti on 10/02/2012. Surgery will be coordinated with CT surgeon Dr. Tyrone Sage. Other history includes atrial fibrillation despite multiple cardioversions, hypertension, CHF, former smoker, mild AI and moderate MI/TI  by echo 08/2012.  PCP is Dr. Susann Givens. Pulmonologist is Dr. Sherene Sires.  Cardiologist is Dr. Elease Hashimoto.  He has recommended bridging her with Lovenox for this procedure while off Coumadin.  EKG on 08/21/12 showed afib with PVCs, rightward axis, incomplete right BBB, ST/T wave abnormality, consider inferolateral ischemia or digitalis effect, prolonged QT.  Echo on 09/10/12 showed: - Left ventricle: The cavity size was normal. Wall thickness was increased in a pattern of mild LVH. Systolic function was normal. The estimated ejection fraction was in the range of 50% to 55%. - Aortic valve: Mild regurgitation. - Mitral valve: Moderate regurgitation. - Left atrium: The atrium was moderately dilated. - Right atrium: The atrium was moderately dilated. - Tricuspid valve: Moderate regurgitation. - Pulmonary arteries: Systolic pressure was moderately increased. PA peak pressure: 55mm Hg (S).  According to 2012 cardiology notes, she had a Lexiscan Myoview study in 2010 that was negative for ischemia, EF 62%.  Carotid duplex on 09/15/12 showed 1-39% bilateral ICAS.  CXR on 08/22/12 showed: 1. Progression of a chronic descending thoracic aortic aneurysm since 2010, now up to 8 cm diameter (versus 5-6 cm in 2010).  2. Stable cardiomegaly. No acute pulmonary process identified.  Preoperative labs noted.  She will need a repeat PT/PTT. Moderate leukocytes on UA, otherwise negative--I routed UA results to Dr. Myra Gianotti and [facility] nurses Darel Hong and Okey Regal.  Defer additional lab orders, if any, to Dr. Myra Gianotti. (Update:  Dr. Myra Gianotti plans to treat for possible UTI with a five day  course of Cipro and wants a UA repeated on arrival the day of surgery.)  Her cardiologist is aware of surgery.  She will be started on a Lovenox bridge.  If follow-up labs are reasonable and no significant change in her status then would anticipate she could proceed as planned.  She will be evaluated by her assigned anesthesiologist on the day of surgery.  Velna Ochs [facility]/Anesthesiology Phone [phone] 09/25/2012 1:36 PM

## 2012-09-25 NOTE — Telephone Encounter (Signed)
Per Dr Myra Gianotti order Cipro 500mg  times five days .  Called to [employer] and [address]

## 2012-09-25 NOTE — Progress Notes (Signed)
  Subjective:    Patient ID: [name] [name], female    DOB: [birth year], 79 y.o.   MRN: [id number]  HPI    Review of Systems     Objective:   Physical Exam        Assessment & Plan:  Her blood work all came back normal. I discussed this with Dr. Elnoria Howard. His recommendation was either to biopsy it or to monitor. I explained this to her and she is comfortable with monitoring. I explained that if the blood studies continues to go up then we will need to do a biopsy. She will call and have blood work in 6 months.

## 2012-09-26 ENCOUNTER — Telehealth: Payer: Self-pay | Admitting: *Deleted

## 2012-09-26 ENCOUNTER — Other Ambulatory Visit: Payer: Self-pay | Admitting: *Deleted

## 2012-09-26 ENCOUNTER — Ambulatory Visit (INDEPENDENT_AMBULATORY_CARE_PROVIDER_SITE_OTHER): Payer: Medicare Other | Admitting: *Deleted

## 2012-09-26 DIAGNOSIS — I4891 Unspecified atrial fibrillation: Secondary | ICD-10-CM

## 2012-09-26 MED ORDER — ENOXAPARIN SODIUM 60 MG/0.6ML ~~LOC~~ SOLN: 60.0000 mg | Freq: Two times a day (BID) | SUBCUTANEOUS | Status: DC

## 2012-09-26 NOTE — Telephone Encounter (Signed)
Called [facility]  [address], [place]. [phone] and s/w Abby . Ordered again, Cipro 500 mg bid times 5 days per Dr Seth Bake. Durene Cal

## 2012-09-26 NOTE — Patient Instructions (Addendum)
09/26/12- Coumadin is on hold until after your surgery 09/27/12- Do nothing, no coumadin no Lovenox injection 09/28/12- Start your  Lovenox injection 60mg s subcutaneously every 12 hours at 8 am and 8pm 09/29/12- Continue Lovenox  Injection 60mg s  subcutaneously every 12 hours at 8 am and 8pm 09/30/12-Continue Lovenox  Injection 60mg s  subcutaneously every 12 hours at 8 am and 8pm 10/01/12- Take last Lovenox injection 60mg s at 8am!!!! Please office when discharged @ [phone]---Tiffany

## 2012-10-01 ENCOUNTER — Ambulatory Visit: Payer: Medicare Other | Admitting: Internal Medicine

## 2012-10-01 MED ORDER — DEXTROSE 5 % IV SOLN
1.5000 g | INTRAVENOUS | Status: AC
  Administered 2012-10-02: 1.5 g via INTRAVENOUS
  Filled 2012-10-01: qty 1.5

## 2012-10-02 ENCOUNTER — Encounter (HOSPITAL_COMMUNITY): Payer: Self-pay | Admitting: *Deleted

## 2012-10-02 ENCOUNTER — Inpatient Hospital Stay (HOSPITAL_COMMUNITY): Payer: Medicare Other | Admitting: Certified Registered"

## 2012-10-02 ENCOUNTER — Inpatient Hospital Stay (HOSPITAL_COMMUNITY)
Admission: RE | Admit: 2012-10-02 | Discharge: 2012-10-07 | DRG: 909 | Disposition: A | Discharge status: Skilled Nursing Facility | Payer: Medicare Other | Source: Ambulatory Visit | Attending: Surgery | Admitting: Surgery

## 2012-10-02 ENCOUNTER — Encounter: Payer: Self-pay | Admitting: Cardiothoracic Surgery

## 2012-10-02 ENCOUNTER — Encounter (HOSPITAL_COMMUNITY)
Admission: RE | Disposition: A | Discharge status: Skilled Nursing Facility | Payer: Self-pay | Source: Ambulatory Visit | Attending: Surgery

## 2012-10-02 ENCOUNTER — Inpatient Hospital Stay (HOSPITAL_COMMUNITY): Payer: Medicare Other

## 2012-10-02 ENCOUNTER — Encounter (HOSPITAL_COMMUNITY): Payer: Self-pay | Admitting: Vascular Surgery

## 2012-10-02 DIAGNOSIS — I059 Rheumatic mitral valve disease, unspecified: Secondary | ICD-10-CM | POA: Diagnosis present

## 2012-10-02 DIAGNOSIS — Z79899 Other long term (current) drug therapy: Secondary | ICD-10-CM

## 2012-10-02 DIAGNOSIS — T85698A Other mechanical complication of other specified internal prosthetic devices, implants and grafts, initial encounter: Principal | ICD-10-CM | POA: Diagnosis present

## 2012-10-02 DIAGNOSIS — Z7901 Long term (current) use of anticoagulants: Secondary | ICD-10-CM

## 2012-10-02 DIAGNOSIS — I2510 Atherosclerotic heart disease of native coronary artery without angina pectoris: Secondary | ICD-10-CM | POA: Diagnosis present

## 2012-10-02 DIAGNOSIS — I4891 Unspecified atrial fibrillation: Secondary | ICD-10-CM | POA: Diagnosis present

## 2012-10-02 DIAGNOSIS — I498 Other specified cardiac arrhythmias: Secondary | ICD-10-CM | POA: Diagnosis present

## 2012-10-02 DIAGNOSIS — I10 Essential (primary) hypertension: Secondary | ICD-10-CM | POA: Diagnosis present

## 2012-10-02 DIAGNOSIS — Y849 Medical procedure, unspecified as the cause of abnormal reaction of the patient, or of later complication, without mention of misadventure at the time of the procedure: Secondary | ICD-10-CM | POA: Diagnosis present

## 2012-10-02 DIAGNOSIS — I712 Thoracic aortic aneurysm, without rupture, unspecified: Secondary | ICD-10-CM | POA: Diagnosis present

## 2012-10-02 HISTORY — DX: Fracture of neck, unspecified, initial encounter: S12.9XXA

## 2012-10-02 HISTORY — PX: THORACIC AORTIC ENDOVASCULAR STENT GRAFT: SHX6112

## 2012-10-02 LAB — PROTIME-INR
INR: 1.27 (ref 0.00–1.49)
Prothrombin Time: 14.4 seconds (ref 11.6–15.2)
Prothrombin Time: 15.6 seconds — ABNORMAL HIGH (ref 11.6–15.2)

## 2012-10-02 LAB — CBC
HCT: 35.8 % — ABNORMAL LOW (ref 36.0–46.0)
Hemoglobin: 12.3 g/dL (ref 12.0–15.0)
MCH: 31.8 pg (ref 26.0–34.0)
MCHC: 34.4 g/dL (ref 30.0–36.0)
MCV: 92.5 fL (ref 78.0–100.0)
Platelets: 157 10*3/uL (ref 150–400)
RBC: 3.87 MIL/uL (ref 3.87–5.11)
RDW: 13.3 % (ref 11.5–15.5)
WBC: 7.5 10*3/uL (ref 4.0–10.5)

## 2012-10-02 LAB — BASIC METABOLIC PANEL
BUN: 19 mg/dL (ref 6–23)
CO2: 25 mEq/L (ref 19–32)
Calcium: 8.8 mg/dL (ref 8.4–10.5)
Chloride: 104 mEq/L (ref 96–112)
Creatinine, Ser: 0.85 mg/dL (ref 0.50–1.10)
GFR calc Af Amer: 74 mL/min — ABNORMAL LOW (ref >90)
GFR calc non Af Amer: 64 mL/min — ABNORMAL LOW (ref >90)
Glucose, Bld: 123 mg/dL — ABNORMAL HIGH (ref 70–99)
Potassium: 4 mEq/L (ref 3.5–5.1)
Sodium: 138 mEq/L (ref 135–145)

## 2012-10-02 LAB — APTT
aPTT: 34 seconds (ref 24–37)
aPTT: 38 seconds — ABNORMAL HIGH (ref 24–37)

## 2012-10-02 LAB — MAGNESIUM: Magnesium: 1.7 mg/dL (ref 1.5–2.5)

## 2012-10-02 SURGERY — INSERTION, ENDOVASCULAR STENT GRAFT, AORTA, THORACIC
Anesthesia: General | Wound class: Clean

## 2012-10-02 MED ORDER — IODIXANOL 320 MG/ML IV SOLN
INTRAVENOUS | Status: DC | PRN
  Administered 2012-10-02: 150 mL via INTRA_ARTERIAL

## 2012-10-02 MED ORDER — OXYCODONE HCL 5 MG PO TABS: 5.0000 mg | ORAL_TABLET | Freq: Once | ORAL | Status: DC | PRN

## 2012-10-02 MED ORDER — ALUM & MAG HYDROXIDE-SIMETH 200-200-20 MG/5ML PO SUSP: 15.0000 mL | ORAL | Status: DC | PRN

## 2012-10-02 MED ORDER — LACTATED RINGERS IV SOLN
INTRAVENOUS | Status: DC | PRN
  Administered 2012-10-02: 07:00:00 via INTRAVENOUS

## 2012-10-02 MED ORDER — OXYCODONE HCL 5 MG/5ML PO SOLN: 5.0000 mg | Freq: Once | ORAL | Status: DC | PRN

## 2012-10-02 MED ORDER — SODIUM CHLORIDE 0.9 % IV SOLN
INTRAVENOUS | Status: DC
  Administered 2012-10-02: 18:00:00 via INTRAVENOUS

## 2012-10-02 MED ORDER — 0.9 % SODIUM CHLORIDE (POUR BTL) OPTIME
TOPICAL | Status: DC | PRN
  Administered 2012-10-02: 1000 mL

## 2012-10-02 MED ORDER — ACETAMINOPHEN 325 MG RE SUPP
325.0000 mg | RECTAL | Status: DC | PRN
Start: 2012-10-02 — End: 2012-10-07
  Filled 2012-10-02: qty 2

## 2012-10-02 MED ORDER — DIGOXIN 125 MCG PO TABS
125.0000 ug | ORAL_TABLET | Freq: Every day | ORAL | Status: DC
  Administered 2012-10-02 – 2012-10-06 (×5): 125 ug via ORAL
  Filled 2012-10-02 (×8): qty 1

## 2012-10-02 MED ORDER — SPIRONOLACTONE 12.5 MG HALF TABLET
12.5000 mg | ORAL_TABLET | Freq: Every morning | ORAL | Status: DC
  Administered 2012-10-02 – 2012-10-07 (×6): 12.5 mg via ORAL
  Filled 2012-10-02 (×8): qty 1

## 2012-10-02 MED ORDER — PHENOL 1.4 % MT LIQD
1.0000 | OROMUCOSAL | Status: DC | PRN
  Administered 2012-10-02: 1 via OROMUCOSAL
  Filled 2012-10-02: qty 177

## 2012-10-02 MED ORDER — PRAZOSIN HCL 2 MG PO CAPS
2.0000 mg | ORAL_CAPSULE | Freq: Two times a day (BID) | ORAL | Status: DC
  Administered 2012-10-02 – 2012-10-07 (×9): 2 mg via ORAL
  Filled 2012-10-02 (×14): qty 1

## 2012-10-02 MED ORDER — DEXTROSE 5 % IV SOLN
1.5000 g | Freq: Two times a day (BID) | INTRAVENOUS | Status: AC
  Administered 2012-10-02 – 2012-10-03 (×2): 1.5 g via INTRAVENOUS
  Filled 2012-10-02 (×2): qty 1.5

## 2012-10-02 MED ORDER — SODIUM CHLORIDE 0.9 % IR SOLN
Status: DC | PRN
  Administered 2012-10-02: 09:00:00

## 2012-10-02 MED ORDER — PROTAMINE SULFATE 10 MG/ML IV SOLN
INTRAVENOUS | Status: DC | PRN
  Administered 2012-10-02 (×2): 20 mg via INTRAVENOUS
  Administered 2012-10-02: 10 mg via INTRAVENOUS

## 2012-10-02 MED ORDER — PROPRANOLOL HCL 10 MG PO TABS
10.0000 mg | ORAL_TABLET | Freq: Every day | ORAL | Status: DC
  Administered 2012-10-03 – 2012-10-07 (×4): 10 mg via ORAL
  Filled 2012-10-02 (×6): qty 1

## 2012-10-02 MED ORDER — ROCURONIUM BROMIDE 100 MG/10ML IV SOLN
INTRAVENOUS | Status: DC | PRN
  Administered 2012-10-02: 50 mg via INTRAVENOUS
  Administered 2012-10-02: 10 mg via INTRAVENOUS

## 2012-10-02 MED ORDER — MIDAZOLAM HCL 2 MG/2ML IJ SOLN: 0.5000 mg | Freq: Once | INTRAMUSCULAR | Status: DC | PRN

## 2012-10-02 MED ORDER — ONDANSETRON HCL 4 MG/2ML IJ SOLN: 4.0000 mg | Freq: Four times a day (QID) | INTRAMUSCULAR | Status: DC | PRN

## 2012-10-02 MED ORDER — POTASSIUM CHLORIDE CRYS ER 20 MEQ PO TBCR: 20.0000 meq | EXTENDED_RELEASE_TABLET | Freq: Once | ORAL | Status: AC | PRN

## 2012-10-02 MED ORDER — PHENYLEPHRINE HCL 10 MG/ML IJ SOLN
10.0000 mg | INTRAVENOUS | Status: DC | PRN
  Administered 2012-10-02: 10 ug/min via INTRAVENOUS

## 2012-10-02 MED ORDER — MORPHINE SULFATE 2 MG/ML IJ SOLN
2.0000 mg | INTRAMUSCULAR | Status: DC | PRN
  Administered 2012-10-02 – 2012-10-05 (×11): 2 mg via INTRAVENOUS
  Administered 2012-10-05: 4 mg via INTRAVENOUS
  Administered 2012-10-05 (×2): 2 mg via INTRAVENOUS
  Filled 2012-10-02 (×12): qty 1
  Filled 2012-10-02: qty 2
  Filled 2012-10-02: qty 1

## 2012-10-02 MED ORDER — DOCUSATE SODIUM 100 MG PO CAPS
100.0000 mg | ORAL_CAPSULE | Freq: Every day | ORAL | Status: DC
  Administered 2012-10-03 – 2012-10-05 (×3): 100 mg via ORAL
  Filled 2012-10-02 (×4): qty 1

## 2012-10-02 MED ORDER — SODIUM CHLORIDE 0.9 % IV SOLN
INTRAVENOUS | Status: DC
  Administered 2012-10-02: 20 mL/h via INTRAVENOUS

## 2012-10-02 MED ORDER — GLYCOPYRROLATE 0.2 MG/ML IJ SOLN
INTRAMUSCULAR | Status: DC | PRN
  Administered 2012-10-02: 0.6 mg via INTRAVENOUS

## 2012-10-02 MED ORDER — NEOSTIGMINE METHYLSULFATE 1 MG/ML IJ SOLN
INTRAMUSCULAR | Status: DC | PRN
  Administered 2012-10-02: 4 mg via INTRAVENOUS

## 2012-10-02 MED ORDER — MIDAZOLAM HCL 2 MG/2ML IJ SOLN
INTRAMUSCULAR | Status: AC
  Administered 2012-10-02: 2 mg via INTRAVENOUS
  Filled 2012-10-02: qty 2

## 2012-10-02 MED ORDER — PROPRANOLOL HCL ER 160 MG PO CP24
160.0000 mg | ORAL_CAPSULE | Freq: Every day | ORAL | Status: DC
  Administered 2012-10-02 – 2012-10-06 (×5): 160 mg via ORAL
  Filled 2012-10-02 (×9): qty 1

## 2012-10-02 MED ORDER — ONDANSETRON HCL 4 MG/2ML IJ SOLN
INTRAMUSCULAR | Status: DC | PRN
  Administered 2012-10-02: 4 mg via INTRAVENOUS

## 2012-10-02 MED ORDER — MAGNESIUM SULFATE 40 MG/ML IJ SOLN: 2.0000 g | Freq: Once | INTRAMUSCULAR | Status: AC | PRN

## 2012-10-02 MED ORDER — ACETAMINOPHEN 325 MG PO TABS
325.0000 mg | ORAL_TABLET | ORAL | Status: DC | PRN
  Administered 2012-10-03 – 2012-10-04 (×4): 650 mg via ORAL
  Administered 2012-10-07: 325 mg via ORAL
  Filled 2012-10-02 (×5): qty 2

## 2012-10-02 MED ORDER — MEPERIDINE HCL 25 MG/ML IJ SOLN: 6.2500 mg | INTRAMUSCULAR | Status: DC | PRN

## 2012-10-02 MED ORDER — GUAIFENESIN-DM 100-10 MG/5ML PO SYRP: 15.0000 mL | ORAL_SOLUTION | ORAL | Status: DC | PRN

## 2012-10-02 MED ORDER — SODIUM CHLORIDE 0.9 % IV SOLN: 500.0000 mL | Freq: Once | INTRAVENOUS | Status: AC | PRN

## 2012-10-02 MED ORDER — FENTANYL CITRATE 0.05 MG/ML IJ SOLN
25.0000 ug | INTRAMUSCULAR | Status: DC | PRN
  Administered 2012-10-02 (×3): 25 ug via INTRAVENOUS

## 2012-10-02 MED ORDER — PROPOFOL 10 MG/ML IV BOLUS
INTRAVENOUS | Status: DC | PRN
  Administered 2012-10-02: 120 mg via INTRAVENOUS

## 2012-10-02 MED ORDER — PROMETHAZINE HCL 25 MG/ML IJ SOLN: 6.2500 mg | INTRAMUSCULAR | Status: DC | PRN

## 2012-10-02 MED ORDER — TRAMADOL HCL 50 MG PO TABS
50.0000 mg | ORAL_TABLET | Freq: Four times a day (QID) | ORAL | Status: DC | PRN
  Administered 2012-10-02 – 2012-10-03 (×2): 50 mg via ORAL
  Filled 2012-10-02 (×2): qty 1

## 2012-10-02 MED ORDER — HEPARIN SODIUM (PORCINE) 1000 UNIT/ML IJ SOLN
INTRAMUSCULAR | Status: DC | PRN
  Administered 2012-10-02: 6000 [IU] via INTRAVENOUS

## 2012-10-02 MED ORDER — FENTANYL CITRATE 0.05 MG/ML IJ SOLN
INTRAMUSCULAR | Status: AC
  Filled 2012-10-02: qty 2

## 2012-10-02 MED ORDER — PANTOPRAZOLE SODIUM 40 MG PO TBEC
40.0000 mg | DELAYED_RELEASE_TABLET | Freq: Every day | ORAL | Status: DC
  Administered 2012-10-02 – 2012-10-05 (×4): 40 mg via ORAL
  Filled 2012-10-02 (×4): qty 1

## 2012-10-02 MED ORDER — FENTANYL CITRATE 0.05 MG/ML IJ SOLN
INTRAMUSCULAR | Status: AC
  Administered 2012-10-02 (×2): 50 ug via INTRAVENOUS
  Filled 2012-10-02: qty 2

## 2012-10-02 MED ORDER — HYDRALAZINE HCL 20 MG/ML IJ SOLN
10.0000 mg | INTRAMUSCULAR | Status: DC | PRN
  Filled 2012-10-02 (×2): qty 0.5

## 2012-10-02 MED ORDER — FENTANYL CITRATE 0.05 MG/ML IJ SOLN
INTRAMUSCULAR | Status: DC | PRN
  Administered 2012-10-02: 250 ug via INTRAVENOUS
  Administered 2012-10-02: 50 ug via INTRAVENOUS

## 2012-10-02 MED ORDER — ALBUMIN HUMAN 5 % IV SOLN
INTRAVENOUS | Status: DC | PRN
  Administered 2012-10-02: 08:00:00 via INTRAVENOUS

## 2012-10-02 SURGICAL SUPPLY — 83 items
ADH SKN CLS APL DERMABOND .7 (GAUZE/BANDAGES/DRESSINGS) ×1
BAG BANDED W/RUBBER/TAPE 36X54 (MISCELLANEOUS) ×3 IMPLANT
BAG DECANTER FOR FLEXI CONT (MISCELLANEOUS) IMPLANT
BAG EQP BAND 135X91 W/RBR TAPE (MISCELLANEOUS) ×1
BAG SNAP BAND KOVER 36X36 (MISCELLANEOUS) ×6 IMPLANT
BALLN CODA OCL 2-9.0-35-120-3 (BALLOONS)
BALLOON COD OCL 2-9.0-35-120-3 (BALLOONS) IMPLANT
CANISTER SUCTION 2500CC (MISCELLANEOUS) ×2 IMPLANT
CATH ACCU-VU SIZ PIG 5F 100CM (CATHETERS) ×1 IMPLANT
CATH BEACON 5.038 65CM KMP-01 (CATHETERS) ×1 IMPLANT
CLIP TI MEDIUM 24 (CLIP) IMPLANT
CLIP TI WIDE RED SMALL 24 (CLIP) IMPLANT
CLOTH BEACON ORANGE TIMEOUT ST (SAFETY) ×2 IMPLANT
COVER DOME SNAP 22 D (MISCELLANEOUS) ×3 IMPLANT
COVER MAYO STAND STRL (DRAPES) ×2 IMPLANT
COVER PROBE W GEL 5X96 (DRAPES) ×2 IMPLANT
COVER SURGICAL LIGHT HANDLE (MISCELLANEOUS) ×2 IMPLANT
DERMABOND ADVANCED (GAUZE/BANDAGES/DRESSINGS) ×1
DERMABOND ADVANCED .7 DNX12 (GAUZE/BANDAGES/DRESSINGS) ×1 IMPLANT
DEVICE CLOSURE PERCLS PRGLD 6F (VASCULAR PRODUCTS) IMPLANT
DRAIN CHANNEL 10F 3/8 F FF (DRAIN) IMPLANT
DRAIN CHANNEL 10M FLAT 3/4 FLT (DRAIN) IMPLANT
DRAPE TABLE COVER HEAVY DUTY (DRAPES) ×2 IMPLANT
DRESSING OPSITE X SMALL 2X3 (GAUZE/BANDAGES/DRESSINGS) ×1 IMPLANT
DRYSEAL FLEXSHEATH 24FR 33CM (SHEATH) ×1
ELECT REM PT RETURN 9FT ADLT (ELECTROSURGICAL) ×4
ELECTRODE REM PT RTRN 9FT ADLT (ELECTROSURGICAL) ×2 IMPLANT
ENDOPROSTHESIS THORAC 37X37X15 (Endovascular Graft) IMPLANT
ENDOPROSTHESIS THORAC 40X40X15 (Endovascular Graft) IMPLANT
ENDOPROTH THORACIC 37X37X15 (Endovascular Graft) ×2 IMPLANT
ENDOPROTH THORACIC 40X40X15 (Endovascular Graft) ×2 IMPLANT
EVACUATOR 3/16  PVC DRAIN (DRAIN)
EVACUATOR 3/16 PVC DRAIN (DRAIN) IMPLANT
EVACUATOR SILICONE 100CC (DRAIN) IMPLANT
GAUZE SPONGE 2X2 8PLY STRL LF (GAUZE/BANDAGES/DRESSINGS) IMPLANT
GLOVE BIO SURGEON STRL SZ 6.5 (GLOVE) ×1 IMPLANT
GLOVE BIOGEL PI IND STRL 6.5 (GLOVE) IMPLANT
GLOVE BIOGEL PI IND STRL 7.5 (GLOVE) ×1 IMPLANT
GLOVE BIOGEL PI INDICATOR 6.5 (GLOVE) ×3
GLOVE BIOGEL PI INDICATOR 7.5 (GLOVE) ×1
GLOVE ECLIPSE 6.5 STRL STRAW (GLOVE) ×1 IMPLANT
GLOVE SURG SS PI 7.0 STRL IVOR (GLOVE) ×1 IMPLANT
GLOVE SURG SS PI 7.5 STRL IVOR (GLOVE) ×3 IMPLANT
GOWN PREVENTION PLUS XXLARGE (GOWN DISPOSABLE) ×2 IMPLANT
GOWN STRL NON-REIN LRG LVL3 (GOWN DISPOSABLE) ×6 IMPLANT
GOWN STRL REIN XL XLG (GOWN DISPOSABLE) ×1 IMPLANT
GRAFT BALLN CATH 65CM (STENTS) IMPLANT
HEMOSTAT SNOW SURGICEL 2X4 (HEMOSTASIS) IMPLANT
HEMOSTAT SURGICEL 2X14 (HEMOSTASIS) IMPLANT
KIT BASIN OR (CUSTOM PROCEDURE TRAY) ×2 IMPLANT
KIT ROOM TURNOVER OR (KITS) ×2 IMPLANT
NDL PERC 18GX7CM (NEEDLE) ×1 IMPLANT
NEEDLE PERC 18GX7CM (NEEDLE) ×2 IMPLANT
NS IRRIG 1000ML POUR BTL (IV SOLUTION) ×2 IMPLANT
PACK AORTA (CUSTOM PROCEDURE TRAY) ×2 IMPLANT
PAD ARMBOARD 7.5X6 YLW CONV (MISCELLANEOUS) ×4 IMPLANT
PENCIL BUTTON HOLSTER BLD 10FT (ELECTRODE) IMPLANT
PERCLOSE PROGLIDE 6F (VASCULAR PRODUCTS) ×6
SHEATH AVANTI 11CM 8FR (MISCELLANEOUS) ×1 IMPLANT
SHEATH DRYSEAL FLEX 24FR 33CM (SHEATH) IMPLANT
SPONGE GAUZE 2X2 STER 10/PKG (GAUZE/BANDAGES/DRESSINGS) ×1
STAPLER VISISTAT 35W (STAPLE) IMPLANT
STENT GRAFT BALLN CATH 65CM (STENTS) ×1
STOPCOCK MORSE 400PSI 3WAY (MISCELLANEOUS) ×2 IMPLANT
SUT ETHILON 3 0 PS 1 (SUTURE) IMPLANT
SUT PROLENE 5 0 C 1 24 (SUTURE) IMPLANT
SUT VIC AB 2-0 CT1 27 (SUTURE)
SUT VIC AB 2-0 CT1 TAPERPNT 27 (SUTURE) IMPLANT
SUT VIC AB 3-0 SH 27 (SUTURE)
SUT VIC AB 3-0 SH 27X BRD (SUTURE) IMPLANT
SUT VICRYL 4-0 PS2 18IN ABS (SUTURE) ×4 IMPLANT
SYR 20CC LL (SYRINGE) ×4 IMPLANT
SYR 30ML LL (SYRINGE) ×1 IMPLANT
SYR 5ML LL (SYRINGE) IMPLANT
SYR MEDRAD MARK V 150ML (SYRINGE) ×2 IMPLANT
SYRINGE 10CC LL (SYRINGE) ×6 IMPLANT
TOWEL OR 17X24 6PK STRL BLUE (TOWEL DISPOSABLE) ×5 IMPLANT
TOWEL OR 17X26 10 PK STRL BLUE (TOWEL DISPOSABLE) ×4 IMPLANT
TRAY FOLEY CATH 14FRSI W/METER (CATHETERS) ×2 IMPLANT
TUBING HIGH PRESSURE 120CM (CONNECTOR) ×2 IMPLANT
WATER STERILE IRR 1000ML POUR (IV SOLUTION) ×1 IMPLANT
WIRE BENTSON .035X145CM (WIRE) ×1 IMPLANT
WIRE STIFF LUNDERQUIST 260MM (WIRE) ×1 IMPLANT

## 2012-10-02 NOTE — Anesthesia Postprocedure Evaluation (Signed)
  Anesthesia Post-op Note  Patient: [name] [name]  Procedure(s) Performed: Procedure(s) with comments: THORACIC AORTIC ENDOVASCULAR STENT GRAFT (N/A) - Ultrasound guided  Patient Location: PACU  Anesthesia Type:General  Level of Consciousness: awake, alert , oriented and patient cooperative  Airway and Oxygen Therapy: Patient Spontanous Breathing and Patient connected to nasal cannula oxygen  Post-op Pain: none  Post-op Assessment: Post-op Vital signs reviewed, Patient's Cardiovascular Status Stable, Respiratory Function Stable, Patent Airway, No signs of Nausea or vomiting and Pain level controlled  Post-op Vital Signs: Reviewed and stable  Complications: No apparent anesthesia complications

## 2012-10-02 NOTE — H&P (View-Only) (Signed)
[facility] [place]   Patient name: [name] [name] MRN: [id number] DOB: Jun 02, 1932 Sex: female   Referred by: Dr. Tyrone Sage  Reason for referral:  Chief Complaint  Patient presents with  . New Evaluation    6 cm TAA - Dr. Tyrone Sage    HISTORY OF PRESENT ILLNESS: This is a very pleasant 79 year old female that I am seeing for evaluation of a descending thoracic aortic aneurysm. This was incidentally discovered during a workup with a CT scan. She denies any chest or back pain. She has a history of hypertension, however she states this has been well controlled. Her average blood pressure is in the 118/70 range. She suffers from hypercholesterolemia but stop her statins secondary to muscle weakness. Her biggest medical problem has been atrial fibrillation. She has undergone ablation in the past, however it returned. She is now managed with digoxin and Coumadin. She has a history of smoking but quit approximately 25 years ago.  Past Medical History  Diagnosis Date  . AF (atrial fibrillation)   . Hypertension   . Aortic insufficiency   . Mitral regurgitation   . Tricuspid regurgitation   . CHF (congestive heart failure)   . Aortic aneurysm   . CAD (coronary artery disease)     Past Surgical History  Procedure Laterality Date  . Tonsillectomy    . Cystectomy      sacrum    History   Social History  . Marital Status: Widowed    Spouse Name: N/A    Number of Children: 3  . Years of Education: N/A   Occupational History  . Holiday representative    Social History Main Topics  . Smoking status: Former Smoker -- 1.00 packs/day for 20 years    Types: Cigarettes    Quit date: 08/22/1985  . Smokeless tobacco: Never Used  . Alcohol Use: 4.2 oz/week    7 Glasses of wine per week     Comment: a glass of wine every night  . Drug Use: No  . Sexually Active: Not on file   Other Topics Concern  . Not on file   Social History Narrative  . No narrative on file     Family History  Problem Relation Age of Onset  . Aneurysm Mother 57  . Cancer Mother     breast  . Heart disease Mother   . Hypertension Mother   . Hodgkin's lymphoma Father 3  . Cancer Father     hodgkins    Allergies as of 09/15/2012 - Review Complete 09/15/2012  Allergen Reaction Noted  . Atorvastatin  11/15/2010  . Amiodarone hcl    . Aspirin  11/08/2010  . Diltiazem hcl    . Flecainide acetate    . Metoprolol succinate    . Sotalol hcl      Current Outpatient Prescriptions on File Prior to Visit  Medication Sig Dispense Refill  . acetaminophen (TYLENOL) 500 MG tablet Take 500 mg by mouth every 4 (four) hours as needed.       . digoxin (LANOXIN) 0.125 MG tablet Take 125 mcg by mouth at bedtime.      . furosemide (LASIX) 20 MG tablet Take 1 tablet (20 mg total) by mouth daily.  90 tablet  3  . prazosin (MINIPRESS) 2 MG capsule Take 1 capsule (2 mg total) by mouth 2 (two) times daily.  180 capsule  3  . propranolol (INDERAL) 10 MG tablet Take 1 tablet (10 mg total) by mouth daily.  10 MG ONCE IN THE MORNING  90 tablet  3  . propranolol ER (INDERAL LA) 160 MG SR capsule Take 160 mg by mouth at bedtime.      Marland Kitchen spironolactone (ALDACTONE) 12.5 mg TABS Take 12.5 mg by mouth every morning.      . warfarin (COUMADIN) 5 MG tablet Take 5 mg by mouth daily. MON,WED,FRI  PT TAKE 5 MG ON SUN,TUES,THURS,SAT PT TAKES 2.5       No current facility-administered medications on file prior to visit.     REVIEW OF SYSTEMS: Cardiovascular: Positive for palpitations, shortness of breath with exertion, pain in legs with walking. Pulmonary: No productive cough, asthma or wheezing. Neurologic: No weakness, paresthesias, aphasia, or amaurosis. No dizziness. Hematologic: No bleeding problems or clotting disorders. Musculoskeletal: No joint pain or joint swelling. Gastrointestinal: No blood in stool or hematemesis Genitourinary: No dysuria or hematuria. Psychiatric:: No history of major  depression. Integumentary: No rashes or ulcers. Constitutional: No fever or chills.  PHYSICAL EXAMINATION: General: The patient appears their stated age.  Vital signs are BP 129/67  Pulse 49  Ht 5\' 4"  (1.626 m)  Wt 131 lb 14.4 oz (59.829 kg)  BMI 22.63 kg/m2  SpO2 98% HEENT:  No gross abnormalities Pulmonary: Respirations are non-labored Abdomen: Soft and non-tender  Musculoskeletal: There are no major deformities.   Neurologic: No focal weakness or paresthesias are detected, Skin: There are no ulcer or rashes noted. Psychiatric: The patient has normal affect. Cardiovascular: There is a regular rate and rhythm without significant murmur appreciated. No carotid bruits. Palpable pedal pulses.  Diagnostic Studies: I have reviewed her CT scans which show a 6 cm descending thoracic aortic aneurysm  Carotid ultrasound was ordered today which is no significant carotid stenosis  Outside Studies/Documentation Historical records were reviewed.  They showed TAAA  Medication Changes: Need to be off Coumadin with lovenox bridge  Assessment:  Descending thoracic aortic aneurysm Plan: Had lengthy discussion with the patient regarding treatment options. Because of the size of her aneurysm, I feel that this is at risk for rupture, and it should be repaired. I feel that she would be a good candidate for endovascular repair. We talked about the risks and benefits including the risk of paralysis and spinal cord ischemia. We also discussed the potential access site complications and the need for a potential iliac conduit. However, after reviewing her images I would recommend a percutaneous femoral approach. Timing of her operation will be coordinated with Dr. Tyrone Sage.  The patient is worried about coming off her Coumadin and therefore I have recommended a Lovenox bridge.     Jorge Ny, M.D. [facility] [place] Office: [phone] Pager:  [phone]

## 2012-10-02 NOTE — Preoperative (Signed)
Beta Blockers   Reason not to administer Beta Blockers:Not Applicable 

## 2012-10-02 NOTE — Progress Notes (Signed)
Patient ID: [name] [name], female   DOB: 31-Jan-1932, 79 y.o.   MRN: [id number]                   [address]            [place] [address]          [phone]     Day of Surgery Procedure(s) (LRB): THORACIC AORTIC ENDOVASCULAR STENT GRAFT (N/A)  Total Length of Stay:  LOS: 0 days  BP 143/77  Pulse 57  Temp(Src) 97.5 F (36.4 C) (Oral)  Resp 16  SpO2 99%  .Intake/Output     03/19 0701 - 03/20 0700 03/20 0701 - 03/21 0700   P.O.  340   I.V.  1700   IV Piggyback  300   Total Intake   2340   Urine  590   Total Output   590   Net   +1750          . sodium chloride 75 mL/hr at 10/02/12 1800     Lab Results  Component Value Date   WBC 7.5 10/02/2012   HGB 12.3 10/02/2012   HCT 35.8* 10/02/2012   PLT 157 10/02/2012   GLUCOSE 123* 10/02/2012   CHOL 204* 08/18/2012   TRIG 54.0 08/18/2012   HDL 54.80 08/18/2012   LDLDIRECT 127.6 08/18/2012   LDLCALC 100* 10/05/2010   ALT 19 09/24/2012   AST 25 09/24/2012   NA 138 10/02/2012   K 4.0 10/02/2012   CL 104 10/02/2012   CREATININE 0.85 10/02/2012   BUN 19 10/02/2012   CO2 25 10/02/2012   TSH 1.382 Test methodology is 3rd generation TSH 01/05/2009   INR 1.27 10/02/2012   Stable, awake and alert neuro intact with nl movement and sensation lower extremities. Left groin without hematoma  Delight Ovens MD  Beeper [phone] Office [phone] 10/02/2012 6:51 PM

## 2012-10-02 NOTE — Progress Notes (Signed)
Dr. Jackson to sign pt out 

## 2012-10-02 NOTE — Transfer of Care (Signed)
Immediate Anesthesia Transfer of Care Note  Patient: [name] [name]  Procedure(s) Performed: Procedure(s) with comments: THORACIC AORTIC ENDOVASCULAR STENT GRAFT (N/A) - Ultrasound guided  Patient Location: PACU  Anesthesia Type:General  Level of Consciousness: awake and sedated  Airway & Oxygen Therapy: Patient connected to face mask oxygen  Post-op Assessment: Report given to PACU RN  Post vital signs: stable  Complications: No apparent anesthesia complications

## 2012-10-02 NOTE — Anesthesia Procedure Notes (Signed)
Procedure Name: Intubation Date/Time: 10/02/2012 7:45 AM Performed by: Ellin Goodie Pre-anesthesia Checklist: Patient identified, Emergency Drugs available, Suction available, Patient being monitored and Timeout performed Patient Re-evaluated:Patient Re-evaluated prior to inductionOxygen Delivery Method: Circle system utilized Preoxygenation: Pre-oxygenation with 100% oxygen Intubation Type: IV induction Ventilation: Mask ventilation without difficulty Laryngoscope Size: Mac and 3 Grade View: Grade I Tube type: Oral Tube size: 7.5 mm Number of attempts: 1 Airway Equipment and Method: Stylet Placement Confirmation: ETT inserted through vocal cords under direct vision,  positive ETCO2 and breath sounds checked- equal and bilateral Secured at: 22 cm Tube secured with: Tape Dental Injury: Teeth and Oropharynx as per pre-operative assessment

## 2012-10-02 NOTE — Interval H&P Note (Signed)
History and Physical Interval Note:  10/02/2012 7:15 AM  [name] [name]  has presented today for surgery, with the diagnosis of ANEURYSM  The various methods of treatment have been discussed with the patient and family. After consideration of risks, benefits and other options for treatment, the patient has consented to  Procedure(s): THORACIC AORTIC ENDOVASCULAR STENT GRAFT (N/A) as a surgical intervention .  The patient's history has been reviewed, patient examined, no change in status, stable for surgery.  I have reviewed the patient's chart and labs.  Questions were answered to the patient's satisfaction.     BRABHAM IV, V. WELLS

## 2012-10-02 NOTE — H&P (Signed)
[address]  [place] [address]  [phone]  [name] [name]  [facility] Medical Record #[id number]  Date of Birth: Jul 17, 1931  Referring: Nahser, Deloris Ping, MD  Primary Care: [name], MD  Chief Complaint:  Chief Complaint   Patient presents with   .  TAA     f/u after ECHO and ABI's   History of Present Illness:  Patient is a 79 year old female who was diagnosed with atrial fibrillation 5-6 years ago. She's had several cardioversions and hospitalizations to regulate anti-arrhythmic drugs. Because of increasing shortness of breath with exertion and increasing fatigue she recently was referred to Dr. Sherene Sires prior to starting in cardiopulmonary rehabilitation. An overnight SpO2 study was normal. A chest x-ray was obtained. Because of the findings on chest x-ray of a dilated aorta she has been referred to thoracic surgery office.  Chest x-rays dating back to 2008 show evidence of a descending thoracic aneurysm, especially obvious on the lateral films. The most recent chest x-ray in February of this year again shows evidence of a descending thoracic aneurysm that has increased in size from the chest x-ray in 2008 in 2010. A CTA of the chest abdomen and pelvis has been performed and the patient comes to the office to discuss the diagnosis and treatment.  She has known valvular heart disease and chronic atrial fibrillation on anticoagulation. She denies any previous history of myocardial infarction. She does note increasing shortness of breath with exertion and also cramping in her legs and especially hips when she walks. She denies orthopnea. He smoked for approximately 20 years but quit more than 30 years ago.  Current Activity/ Functional Status:  Patient is independent with mobility/ambulation, transfers, ADL's, IADL's.  Zubrod Score:  At the time of surgery this patient's most appropriate activity status/level should be described as:  [ ]  Normal activity, no  symptoms  [X]  Symptoms, fully ambulatory  [ ]  Symptoms, in bed less than or equal to 50% of the time  [ ]  Symptoms, in bed greater than 50% of the time but less than 100%  [ ]  Bedridden  [ ]  Moribund  Past Medical History   Diagnosis  Date   .  AF (atrial fibrillation)    .  Hypertension    .  Aortic insufficiency    .  Mitral regurgitation    .  Tricuspid regurgitation    .  CHF (congestive heart failure)    .  Aortic aneurysm     Past Surgical History   Procedure  Laterality  Date   .  Tonsillectomy     .  Cystectomy       sacrum    Family History   Problem  Relation  Age of Onset   .  Aneurysm  Mother  50   .  Hodgkin's lymphoma  Father  26   .  Cancer  Mother      breast    History    Social History   .  Marital Status:  Widowed     Spouse Name:  N/A     Number of Children:  3   .  Years of Education:  N/A    Occupational History   .  Holiday representative     Social History Main Topics   .  Smoking status:  Former Smoker -- 1.00 packs/day for 20 years     Types:  Cigarettes     Quit date:  08/22/1985   .  Smokeless  tobacco:  Never Used   .  Alcohol Use:  Yes      Comment: a glass of wine every night   .  Drug Use:  No   .      History   Smoking status   .  Former Smoker -- 1.00 packs/day for 20 years   .  Types:  Cigarettes   .  Quit date:  08/22/1985   Smokeless tobacco   .  Never Used    History   Alcohol Use   .  Yes     Comment: a glass of wine every night    Allergies   Allergen  Reactions   .  Atorvastatin      Muscle aches   .  Amiodarone Hcl      REACTION: Nausea/Vomiting; decrease appetite; affects liver enzymes   .  Aspirin    .  Diltiazem Hcl      REACTION: Nausea/vomitting   .  Flecainide Acetate      REACTION: Intol: nausea/horrible feeling   .  Metoprolol Succinate      REACTION: Nausea/vomiting;dizziness   .  Sotalol Hcl      REACTION: Nausea/vomiting    Current Outpatient Prescriptions   Medication  Sig  Dispense  Refill   .   acetaminophen (TYLENOL) 500 MG tablet  Take 500 mg by mouth every 4 (four) hours as needed.     .  digoxin (LANOXIN) 0.125 MG tablet  Take 125 mcg by mouth at bedtime.     .  furosemide (LASIX) 20 MG tablet  Take 1 tablet (20 mg total) by mouth daily.  90 tablet  3   .  prazosin (MINIPRESS) 2 MG capsule  Take 1 capsule (2 mg total) by mouth 2 (two) times daily.  180 capsule  3   .  propranolol (INDERAL) 10 MG tablet  Take 1 tablet (10 mg total) by mouth daily. 10 MG ONCE IN THE MORNING  90 tablet  3   .  propranolol ER (INDERAL LA) 160 MG SR capsule  Take 160 mg by mouth at bedtime.     Marland Kitchen  spironolactone (ALDACTONE) 12.5 mg TABS  Take 12.5 mg by mouth every morning.     .  warfarin (COUMADIN) 5 MG tablet  Take 5 mg by mouth daily. MON,WED,FRI PT TAKE 5 MG ON SUN,TUES,THURS,SAT PT TAKES 2.5      No current facility-administered medications for this visit.   Review of Systems:  Cardiac Review of Systems: Y or N  Chest Pain [ n ] Resting SOB [n ] Exertional SOB Cove.Etienne ] Orthopnea [ n ]  Pedal Edema Cove.Etienne ] Palpitations [ y ] Syncope [n ] Presyncope [ n ]  General Review of Systems: [Y] = yes [ ] =no  Constitional: recent weight change [ n ]; anorexia [ ] ; fatigue [ ] ; nausea [ ] ; night sweats [n ]; fever [n]; or chills [ n ];  Dental: poor dentition[n ]; Last Dentist visit:  Eye : blurred vision [ ] ; diplopia [ ] ; vision changes [ ] ; Amaurosis fugax[ ] ;  Resp: cough [ ] ; wheezing[ n ]; hemoptysis[n ]; shortness of breath[ y ]; paroxysmal nocturnal dyspnea[n ]; dyspnea on exertion[n ]; or orthopnea[ ] ;  GI: gallstones[ ] , vomiting[ ] ; dysphagia[ ] ; melena[ ] ; hematochezia [ ] ; heartburn[ ] ; Hx of Colonoscopy[ ] ;  GU: kidney stones [ ] ; hematuria[ ] ; dysuria [ ] ; nocturia[ ] ; history of obstruction [ ] ; urinary frequency [ ]   Skin: rash, swelling[ ] ;, hair loss[ ] ; peripheral edema[ ] ; or itching[ ] ;  Musculosketetal: myalgias[ ] ; joint swelling[ ] ; joint erythema[ ] ;  joint pain[ ] ; back pain[ ] ;    Heme/Lymph: bruising[ ] ; bleeding[ ] ; anemia[ ] ;  Neuro: TIA[ ] ; headaches[ ] ; stroke[ ] ; vertigo[ ] ; seizures[ ] ; paresthesias[ ] ; difficulty walking[ ] ;  Psych:depression[ ] ; anxiety[ ] ;  Endocrine: diabetes[ n ]; thyroid dysfunction[ ] ;  Immunizations: Flu Cove.Etienne ]; Pneumococcal[ y ];  Other: Told she had end-stage renal disease at age 23 but has never had any problems since  Physical Exam:  BP 111/71  Pulse 71  Resp 16  Ht 5\' 4"  (1.626 m)  Wt 132 lb (59.875 kg)  BMI 22.65 kg/m2  SpO2 98%  Physical Examination: Physical Examination: General appearance - alert, well appearing, and in no distress, oriented to person, place, and time and normal appearing weight  Mental status - alert, oriented to person, place, and time, anxious  Neck - supple, no significant adenopathy, carotids upstroke normal bilaterally, no bruits  Lymphatics - no palpable lymphadenopathy, no hepatosplenomegaly  Chest - clear to auscultation, no wheezes, rales or rhonchi, symmetric air entry  Heart - irregularly irregular rhythm with rate 70, systolic murmur 3/6 at lower left sternal border, diastolic murmur 3/6 at lower left sternal border  Abdomen - soft, nontender, nondistended, no masses or organomegaly  no rebound tenderness noted  no abdominal bruits  no pulsatile masses  no CVA tenderness  no inguinal adenopathy  Neurological - alert, oriented, normal speech, no focal findings or movement disorder noted, motor and sensory grossly normal bilaterally, normal muscle tone, no tremors, strength 5/5  Musculoskeletal - no joint tenderness, deformity or swelling  Extremities - peripheral pulses normal, no pedal edema, no clubbing or cyanosis, varicose veins noted, venous stasis dermatitis noted  Skin - normal coloration and turgor, no rashes, no suspicious skin lesions noted  Venous stasis changes in the large comes  2+ femoral pulses bilaterally  Diagnostic Studies & Laboratory data:  Recent Radiology Findings:   Dg Chest 2 View  08/22/2012 *RADIOLOGY REPORT* Clinical Data: 79 year old female with shortness of breath, atrial fibrillation. CHEST - 2 VIEW Comparison: 01/04/2009 and earlier. Findings: Chronic ectasia and aneurysmal enlargement of the descending thoracic aorta has progressed since 2010. Maximum descending thoracic aorta diameter now as much as 8 cm (approximately 5-6 cm in 2010). The vessel appears aneurysmal along the approximate 10 cm segment on the lateral view (not significantly changed). Chronic cardiomegaly. Other mediastinal contours are stable. Visualized tracheal air column is within normal limits. No pneumothorax, pulmonary edema, pleural effusion or acute pulmonary opacity. Stable visualized osseous structures. IMPRESSION: 1. Progression of a chronic descending thoracic aortic aneurysm since 2010, now up to 8 cm diameter (versus 5-6 cm in 2010). 2. Stable cardiomegaly. No acute pulmonary process identified. Impression #1 discussed by telephone with RN Lauren at Dr. Thurston Hole office at the time of dictation. Original Report Authenticated By: Erskine Speed, M.D.  Ct Angio Chest Aorta W/cm &/or Wo/cm  09/03/2012 *RADIOLOGY REPORT* Clinical Data: Aortic aneurysm. CT ANGIOGRAPHY CHEST, ABDOMEN AND PELVIS Technique: Multidetector CT imaging through the chest, abdomen and pelvis was performed using the standard protocol during bolus administration of intravenous contrast. Multiplanar reconstructed images including MIPs were obtained and reviewed to evaluate the vascular anatomy. Contrast: OMNIPAQUE IOHEXOL 300 MG/ML SOLN, Comparison: None. CTA CHEST Findings: Borderline aneurysmal dilatation of the ascending aorta. Maximal diameters of the ascending aorta at the sinus of Valsalva, sinotubular junction, and  ascending aorta are 3.8 cm, 3.6 cm, and 4.0 cm respectively. The proximal descending aorta is nonaneurysmal. Approximately 9 cm beyond the origin of the descending aorta aneurysmal dilatation develops  measuring up to 6.1 cm in maximal diameter. There is chronic appearing mural thrombus posteriorly and laterally to the left. The aorta assumes a more normal caliber in the distal descending aorta and then becomes ectatic again at the diaphragmatic hiatus. Focal dissection occurs in the right posterolateral region of the aorta at the diaphragmatic hiatus. It does not appear flow limiting. Atherosclerotic calcifications of the aortic arch, ascending aorta, and descending aorta are present. Minimal calcifications in the aortic valve. Left main coronary artery calcification. Three- vessel coronary artery calcifications are present. Atherosclerotic changes at the origins of the great vessels are noted. Hot no significant narrowing involving the innominate artery, hot right subclavian artery, right common carotid artery, left common carotid artery, and left subclavian artery. Bilateral vertebral arteries are also grossly patent. There are no obvious filling defects in the pulmonary arterial tree to suggest acute pulmonary thromboembolism. Negative abnormal mediastinal adenopathy. Thyroid gland is within normal limits. No pneumothorax. No pericardial effusion. Small left pleural effusion is present and layering. No vertebral compression deformity. Review of the MIP images confirms the above findings. IMPRESSION: Aneurysmal dilatation of the descending thoracic aorta as described measuring up to 6.1 cm. CTA ABDOMEN AND PELVIS Findings: At the diaphragmatic hiatus, the abdominal aorta is 3.9 cm in diameter. A focal dissection in the right posterolateral location is noted which does not appear flow limiting. Atherosclerotic faster calcifications in the aorta are noted within the abdomen. There remainder of the aorta is nonaneurysmal. There is significant narrowing at the origin of the celiac artery, between 50-60%. Branch vessels are patent. Accessory left hepatic artery is noted. SMA is patent. Branch vessels are grossly  patent. Left renal artery is tortuous and patent in mild atherosclerotic changes at its origin. Right renal artery is patent. Mild atherosclerotic changes at its origin. The infrarenal aorta is mildly tortuous. Atherosclerotic changes are present with mild anterolateral plaque towards the left on image 115. Right common and external iliac arteries are tortuous and patent with mild atherosclerotic changes. Left common iliac artery is patent with moderate atherosclerotic changes. Left external iliac artery is patent. Mild disease of the internal iliac arteries without significant narrowing. The IMA is diminutive with moderate disease at its origin. Branch vessels are grossly patent. Sub centimeter hypodensity in the left kidney on image 8 of series 9 is nonspecific. Liver, gallbladder, spleen, adrenal glands, and pancreas are within normal limits. Trace free fluid in the pelvis is nonspecific. Uterus and adnexa are within normal limits. Bladder is within normal limits. No obvious abnormal adenopathy. Advanced degenerative changes in the lumbar spine are present. Severe narrowing at L4-5 is accompanied by 6 mm anterolisthesis L4 upon L5. There is associated marked facet arthropathy. 6 mm and anterolisthesis L5 upon S1. An element of spinal stenosis is suspected at L4-5. Review of the MIP images confirms the above findings. IMPRESSION: Mild aneurysmal dilatation of the aorta at the diaphragmatic hiatus. The infrarenal aorta is nonaneurysmal with mild atherosclerotic plaque. Significant narrowing at the origin of the celiac axis. SMA is patent. Advanced degenerative change in the lumbar spine. Original Report Authenticated By: Jolaine Click, M.D.  Ct Angio Abd/pel W/ And/or W/o  09/03/2012 *RADIOLOGY REPORT* Clinical Data: Aortic aneurysm. CT ANGIOGRAPHY CHEST, ABDOMEN AND PELVIS Technique: Multidetector CT imaging through the chest, abdomen and pelvis was performed using the  standard protocol during bolus administration  of intravenous contrast. Multiplanar reconstructed images including MIPs were obtained and reviewed to evaluate the vascular anatomy. Contrast: OMNIPAQUE IOHEXOL 300 MG/ML SOLN, Comparison: None. CTA CHEST Findings: Borderline aneurysmal dilatation of the ascending aorta. Maximal diameters of the ascending aorta at the sinus of Valsalva, sinotubular junction, and ascending aorta are 3.8 cm, 3.6 cm, and 4.0 cm respectively. The proximal descending aorta is nonaneurysmal. Approximately 9 cm beyond the origin of the descending aorta aneurysmal dilatation develops measuring up to 6.1 cm in maximal diameter. There is chronic appearing mural thrombus posteriorly and laterally to the left. The aorta assumes a more normal caliber in the distal descending aorta and then becomes ectatic again at the diaphragmatic hiatus. Focal dissection occurs in the right posterolateral region of the aorta at the diaphragmatic hiatus. It does not appear flow limiting. Atherosclerotic calcifications of the aortic arch, ascending aorta, and descending aorta are present. Minimal calcifications in the aortic valve. Left main coronary artery calcification. Three- vessel coronary artery calcifications are present. Atherosclerotic changes at the origins of the great vessels are noted. Hot no significant narrowing involving the innominate artery, hot right subclavian artery, right common carotid artery, left common carotid artery, and left subclavian artery. Bilateral vertebral arteries are also grossly patent. There are no obvious filling defects in the pulmonary arterial tree to suggest acute pulmonary thromboembolism. Negative abnormal mediastinal adenopathy. Thyroid gland is within normal limits. No pneumothorax. No pericardial effusion. Small left pleural effusion is present and layering. No vertebral compression deformity. Review of the MIP images confirms the above findings. IMPRESSION: Aneurysmal dilatation of the descending thoracic  aorta as described measuring up to 6.1 cm. CTA ABDOMEN AND PELVIS Findings: At the diaphragmatic hiatus, the abdominal aorta is 3.9 cm in diameter. A focal dissection in the right posterolateral location is noted which does not appear flow limiting. Atherosclerotic faster calcifications in the aorta are noted within the abdomen. There remainder of the aorta is nonaneurysmal. There is significant narrowing at the origin of the celiac artery, between 50-60%. Branch vessels are patent. Accessory left hepatic artery is noted. SMA is patent. Branch vessels are grossly patent. Left renal artery is tortuous and patent in mild atherosclerotic changes at its origin. Right renal artery is patent. Mild atherosclerotic changes at its origin. The infrarenal aorta is mildly tortuous. Atherosclerotic changes are present with mild anterolateral plaque towards the left on image 115. Right common and external iliac arteries are tortuous and patent with mild atherosclerotic changes. Left common iliac artery is patent with moderate atherosclerotic changes. Left external iliac artery is patent. Mild disease of the internal iliac arteries without significant narrowing. The IMA is diminutive with moderate disease at its origin. Branch vessels are grossly patent. Sub centimeter hypodensity in the left kidney on image 8 of series 9 is nonspecific. Liver, gallbladder, spleen, adrenal glands, and pancreas are within normal limits. Trace free fluid in the pelvis is nonspecific. Uterus and adnexa are within normal limits. Bladder is within normal limits. No obvious abnormal adenopathy. Advanced degenerative changes in the lumbar spine are present. Severe narrowing at L4-5 is accompanied by 6 mm anterolisthesis L4 upon L5. There is associated marked facet arthropathy. 6 mm and anterolisthesis L5 upon S1. An element of spinal stenosis is suspected at L4-5. Review of the MIP images confirms the above findings. IMPRESSION: Mild aneurysmal  dilatation of the aorta at the diaphragmatic hiatus. The infrarenal aorta is nonaneurysmal with mild atherosclerotic plaque. Significant narrowing at the origin  of the celiac axis. SMA is patent. Advanced degenerative change in the lumbar spine. Original Report Authenticated By: Jolaine Click, M.D.  Recent Lab Findings:  Lab Results   Component  Value  Date    WBC  8.7  01/04/2009    HGB  14.9  01/04/2009    HCT  44.8  01/04/2009    PLT  204  01/04/2009    GLUCOSE  106*  08/18/2012    CHOL  204*  08/18/2012    TRIG  54.0  08/18/2012    HDL  54.80  08/18/2012    LDLDIRECT  127.6  08/18/2012    LDLCALC  100*  10/05/2010    ALT  25  08/18/2012    AST  29  08/18/2012    NA  141  08/18/2012    K  4.6  08/18/2012    CL  105  08/18/2012    CREATININE  1.0  08/18/2012    BUN  26*  08/18/2012    CO2  30  08/18/2012    TSH  1.382 Test methodology is 3rd generation TSH  01/05/2009    INR  2.3  08/21/2012     Lab Results  Component Value Date   WBC 6.9 09/24/2012   HGB 15.6* 09/24/2012   HCT 45.7 09/24/2012   PLT 233 09/24/2012   GLUCOSE 119* 09/24/2012   CHOL 204* 08/18/2012   TRIG 54.0 08/18/2012   HDL 54.80 08/18/2012   LDLDIRECT 127.6 08/18/2012   LDLCALC 100* 10/05/2010   ALT 19 09/24/2012   AST 25 09/24/2012   NA 137 09/24/2012   K 4.0 09/24/2012   CL 99 09/24/2012   CREATININE 0.97 09/24/2012   BUN 23 09/24/2012   CO2 26 09/24/2012   TSH 1.382 Test methodology is 3rd generation TSH 01/05/2009   INR 3.0 09/26/2012     Lower Extremity Arterial Evaluation  Patient: [name] [name], [name] [name] MR #: [id number] Study Date: 09/10/2012 Gender: F Age: 14 Height: Weight: BSA: Pt. Status: Room:  ATTENDING Elliot Cousin SONOGRAPHER Farrel Demark, RVT, RDMS Reports also to:  ------------------------------------------------------------ History and indications:  History  Pre-op evaluation Aortic valve disorders  ------------------------------------------------------------ Study information:  Study  status: Routine. Procedure: A vascular evaluation was performed. Image quality was adequate. ABI. Segmental pressure measurements, ankle-brachial indices, and photoplethysmography. Location: Vascular laboratory. Patient status: Outpatient.  Brachial pressures:  +--------+-----+----+---+  RightLeftMax +--------+-----+----+---+ Systolic138 130 138 +--------+-----+----+---+ Arterial pressure indices:  +-----------------+--------+--------------+---------+ Location PressureBrachial indexWaveform  +-----------------+--------+--------------+---------+ Right ant tibial Hg0.99 Biphasic  +-----------------+--------+--------------+---------+ Right post tibial126mm Hg1.01 Triphasic +-----------------+--------+--------------+---------+ Left ant tibial Hg0.86 Biphasic  +-----------------+--------+--------------+---------+ Left post tibial Hg0.93 Triphasic +-----------------+--------+--------------+---------+ Normal ABI study.  ------------------------------------------------------------ Summary: Other specific details can be found in the table(s) above. Prepared and Electronically Authenticated by  Sherren Kerns 2014-02-26T16:08:19.050  ECHO:  Echocardiography  Patient: [name] [name], [name] [name] MR #: [id number] Study Date: 09/10/2012 Gender: F Age: 13 Height: 162.6cm Weight: 61.7kg BSA: 1.59m^2 Pt. Status: Room:  PERFORMING [place] ATTENDING Elliot Cousin SONOGRAPHER Melissa Morford, RDCS cc:  ------------------------------------------------------------ LV EF: 50% - 55%  ------------------------------------------------------------ Indications: Aortic insufficiency 424.1.  ------------------------------------------------------------ History: PMH: 6 CM Thoracic Aortic Aneursym Dyspnea. Atrial fibrillation. Congestive heart  failure.  ------------------------------------------------------------ Study Conclusions  - Left ventricle: The cavity size was normal. Wall thickness was increased in a pattern of mild LVH. Systolic function was normal. The estimated ejection fraction was in the range of 50% to 55%. - Aortic valve: Mild regurgitation. -  Mitral valve: Moderate regurgitation. - Left atrium: The atrium was moderately dilated. - Right atrium: The atrium was moderately dilated. - Tricuspid valve: Moderate regurgitation. - Pulmonary arteries: Systolic pressure was moderately increased. PA peak pressure: 55mm Hg (S). Echocardiography. M-mode, complete 2D, spectral Doppler, and color Doppler. Height: Height: 162.6cm. Height: 64in. Weight: Weight: 61.7kg. Weight: 135.7lb. Body mass index: BMI: 23.3kg/m^2. Body surface area: BSA: 1.32m^2. Blood pressure: 102/66. Patient status: Outpatient. Location: Echo laboratory.  ------------------------------------------------------------  ------------------------------------------------------------ Left ventricle: The cavity size was normal. Wall thickness was increased in a pattern of mild LVH. Systolic function was normal. The estimated ejection fraction was in the range of 50% to 55%.  ------------------------------------------------------------ Aortic valve: Doppler: There was no stenosis. Mild regurgitation.  ------------------------------------------------------------ Aorta: Ascending aorta: The ascending aorta was mildly dilated.  ------------------------------------------------------------ Mitral valve: Doppler: Moderate regurgitation.  ------------------------------------------------------------ Left atrium: The atrium was moderately dilated.  ------------------------------------------------------------ Right ventricle: Systolic function was normal.  ------------------------------------------------------------ Pulmonic valve: The valve appears  to be grossly normal. Doppler: Trivial regurgitation.  ------------------------------------------------------------ Tricuspid valve: Doppler: Moderate regurgitation.  ------------------------------------------------------------ Pulmonary artery: Systolic pressure was moderately increased.  ------------------------------------------------------------ Right atrium: The atrium was moderately dilated.  ------------------------------------------------------------ Pericardium: There was no pericardial effusion.  ------------------------------------------------------------  2D measurements Normal Doppler Normal Left ventricle measurements LVID ED, 43.1 mm 43-52 Main pulmonary chord, artery PLAX Pressure, S 55 mm =30 LVID ES, 37.8 mm 23-38 Hg chord, Left ventricle PLAX Ea, lat 9.1 cm/ ------- FS, chord, 12 % >29 ann, tiss s PLAX DP LVPW, ED 12 mm ------ E/Ea, lat 7.76 ------- IVS/LVPW 0.88 <1.3 ann, tiss ratio, ED DP Ventricular septum Ea, med 5.81 cm/ ------- IVS, ED 10.6 mm ------ ann, tiss s Aorta DP Root diam, 37 mm ------ E/Ea, med 12.15 ------- ED ann, tiss Left atrium DP AP dim 49 mm ------ LVOT AP dim 2.92 cm/m^2 <2.2 Peak vel, S 133 cm/ ------- index s VTI, S 27.3 cm ------- Peak 7 mm ------- gradient, S Hg Aortic valve Regurg PHT 746 ms ------- Mitral valve Peak E vel 70.6 cm/ ------- s Peak A vel 36 cm/ ------- s Deceleratio 176 ms 150-230 n time Peak E/A 2 ------- ratio Tricuspid valve Regurg peak 337 cm/ ------- vel s Peak RV-RA 45 mm ------- gradient, S Hg Systemic veins Estimated 10 mm ------- CVP Hg Right ventricle Pressure, S 55 mm <30 Hg Sa vel, lat 9.98 cm/ ------- ann, tiss s DP  ------------------------------------------------------------ Prepared and Electronically Authenticated by  Kristeen Miss 2014-02-26T16:46:[id number]  Assessment / Plan:  #1 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 With the current size I recommended to her that she consider placement of thoracic stent graft to prevent further enlargement and potential rupture of the descending thoracic aorta. The risks and options of medical treatment versus surgical treatment have been reviewed in detail. She is aware of the risk of spinal cord ischemia and paraplegia with any intervention. I discussed with her that with the size of the aorta currently the risk of  rupture is increasing exponentially.  The patient has discussed the treatment options with her daughters further questions were answered and she is willing to proceed.  The goals risks and alternatives of the planned surgical procedure Thoracic Stent Graft  have been discussed with the patient in detail. The risks of the procedure including death, infection, stroke, myocardial infarction, bleeding, blood transfusion , spinal ischemia or conversion to open casehave all been discussed specifically.  I have quoted Cline Crock a 8 % of perioperative mortality and a complication rate as high as 25 %. The patient's questions have been answered.[name] [name] is willing  to proceed with the planned procedure.   Delight Ovens MD  Beeper [phone] Office [phone] 10/02/2012 6:55 AM

## 2012-10-02 NOTE — Anesthesia Preprocedure Evaluation (Addendum)
Anesthesia Evaluation  Patient identified by MRN, date of birth, ID band Patient awake    Reviewed: Allergy & Precautions, H&P , NPO status , Patient's Chart, lab work & pertinent test results  History of Anesthesia Complications Negative for: history of anesthetic complications  Airway Mallampati: I TM Distance: >3 FB Neck ROM: Full    Dental  (+) Teeth Intact and Loose   Pulmonary shortness of breath, former smoker,  breath sounds clear to auscultation  Pulmonary exam normal       Cardiovascular hypertension, Pt. on medications and Pt. on home beta blockers + CAD ('10 myoview: no ischemia, EF 65%) and + Peripheral Vascular Disease (6cm thoracic aneurysm) + dysrhythmias (coumiadin, INR 1.14) Atrial Fibrillation + Valvular Problems/Murmurs AI and MR Rhythm:Regular Rate:Normal  Echo on 09/10/12 showed: - Left ventricle: The cavity size was normal. Wall thickness was increased in a pattern of mild LVH. Systolic function was normal. The estimated ejection fraction was in the range of 50% to 55%. - Aortic valve: Mild regurgitation. - Mitral valve: Moderate regurgitation. - Left atrium: The atrium was moderately dilated. - Right atrium: The atrium was moderately dilated. - Tricuspid valve: Moderate regurgitation. - Pulmonary arteries: Systolic pressure was moderately increased. PA peak pressure: 55mm Hg (S).    - Left atrium: The atrium was severely dilated. - Right atrium: The atrium was severely dilated. - Tricuspid valve: Moderate-severe regurgitation. - Pulmonary arteries: Systolic pressure was moderately increased. PA peak pressure: 50mm Hg (S).      Neuro/Psych PSYCHIATRIC DISORDERS Anxiety negative neurological ROS     GI/Hepatic negative GI ROS, Neg liver ROS,   Endo/Other  negative endocrine ROS  Renal/GU negative Renal ROS     Musculoskeletal   Abdominal (+)  Abdomen: soft. Bowel sounds: normal.  Peds  Hematology negative hematology ROS (+)   Anesthesia Other Findings   Reproductive/Obstetrics                         Anesthesia Physical Anesthesia Plan  ASA: III  Anesthesia Plan: General   Post-op Pain Management:    Induction: Intravenous  Airway Management Planned: Oral ETT  Additional Equipment: Arterial line and CVP  Intra-op Plan:   Post-operative Plan: Extubation in OR  Informed Consent: I have reviewed the patients History and Physical, chart, labs and discussed the procedure including the risks, benefits and alternatives for the proposed anesthesia with the patient or authorized representative who has indicated his/her understanding and acceptance.   Dental advisory given  Plan Discussed with: Surgeon and CRNA  Anesthesia Plan Comments: (Plan routine monitors, A line, PA cath, GETA )        Anesthesia Quick Evaluation

## 2012-10-02 NOTE — Brief Op Note (Signed)
10/02/2012  9:49 AM  PATIENT:  [name] [name]  79 y.o. female  PRE-OPERATIVE DIAGNOSIS:  Decending Thoracic aortic aneurysm  POST-OPERATIVE DIAGNOSIS:  Same  PROCEDURE:  Procedure(s) with comments: THORACIC AORTIC ENDOVASCULAR STENT GRAFT (N/A) - Ultrasound guided Placement of TAG Endoprosthesis x 2 37x15 and 40x 15 without coverage of subclavian or celiac a. Vascular access vis left femoral a and closure with proglide x2 aortagram x 6 contrast total 116 ml SURGEON:  CO-Surgeon(s) and Role:    Nada Libman, MD -cosurgeon    * Delight Ovens, MD - cosurgeon  ANESTHESIA:   general  EBL:  Total I/O In: 250 [IV Piggyback:250] Out: 300 [Urine:300]  BLOOD ADMINISTERED:none  DRAINS: none   LOCAL MEDICATIONS USED:  NONE  SPECIMEN:  No Specimen  DISPOSITION OF SPECIMEN:  N/A  COUNTS:  YES  DICTATION: .Dragon Dictation  PLAN OF CARE: Admit to inpatient   PATIENT DISPOSITION:  PACU - hemodynamically stable.   Delay start of Pharmacological VTE agent (>24hrs) due to surgical blood loss or risk of bleeding: patient to start back on coumadin for AFIB.

## 2012-10-02 NOTE — Op Note (Signed)
[facility]  Patient name: [name] [name] MRN: [id number] DOB: [birth year] Sex: female  10/02/2012 Pre-operative Diagnosis: Descending thoracic aortic aneurysm Post-operative diagnosis:  Same Surgeon:  Jorge Ny Co-surgeon:  Ofilia Neas Procedure:   #1: Endovascular repair of a descending thoracic aortic aneurysm   #2: Distal extension x1   #3: Thoracic aortic angiogram   #4: Abdominal aortic angiogram   #5: Catheter in aorta x2   #6: Ultrasound-guided access, left femoral artery Anesthesia:  Gen. Blood Loss:  See anesthesia record Specimens:  None Findings: Complete exclusion of the aneurysm with preserved patency of the celiac axis Devices used: Proximal piece is a Gore CTAG  37 x 15. Distal extension is a Gore CTAG 40 x 15   Indications:  The patient was found to have a large descending thoracic aneurysm. She was seen preoperatively. The risks and benefits were discussed. All of her questions were answered. She comes in today for repair.  Procedure:  The patient was identified in the holding area and taken to [facility] OR ROOM 16  The patient was then placed supine on the table. general anesthesia was administered.  The patient was prepped and draped in the usual sterile fashion.  A time out was called and antibiotics were administered.  Ultrasound was used to evaluate the left femoral artery. It was widely patent without significant calcification. #11 blade was used to make a skin nick. Under ultrasound guidance, the left common femoral artery was cannulated using an 18-gauge needle. An 035 wire was advanced into the aorta without resistance. The dilator from an 8 French sheath was used to dilate the subcutaneous tissue. 2 pro-glide devices were placed at the 11:00 and 1:00 position for pre-closure. An 8 French sheath was then placed. The patient was fully heparinized. Next, a pigtail catheter was advanced into the descending aorta and thoracic aortic  angiogram was performed to the leaky get the proper length of the first device. Next a Lunderquist double curve wire was advanced into the descending aorta. The 8 French sheath was removed and a 24 [place] sheath was carefully inserted. It was left within the common iliac artery as it was snug through the external iliac. The proximal piece was then prepared on the back table. This was a Gore CTAG 37 x 15. It was advanced through the sheath and placed into the descending thoracic aorta. A second access through the sheath was obtained. A pigtail catheter was advanced over the wire into the descending aorta. A thoracic angiogram in a LAO 55 degrees projection was obtained. The first device was then successfully deployed. The pigtail catheter was removed from behind the graft and then reinserted into the graft. A descending thoracic aortogram as well as an abdominal aortogram was performed so that we could identify the celiac artery origin. This was best done at a RAO 30 degrees projection. A second piece was then prepared this was a 40 x 15 Gore CTAG. This was then successfully deployed with the distal end landing just above the celiac axis. A q.-50 balloon was used to mold the overlap of the 2 devices. Completion angiography was then performed which showed successful exclusion of the aneurysm with preserved patency of the celiac axis. I then performed an abdominal aortogram through the sheath to inspect the plaque at the distal abdominal aorta. No abnormalities were identified. I then withdrew the sheath very carefully into the distal external iliac artery and performed a retrograde injection to make sure  that there were no injuries to the iliac arterial system. This injection showed no evidence of extravasation or dissection. The stiff wire was then exchanged for a Benson wire through a Kumpe catheter. The 24 French sheath was then removed. The pro-glide devices were used for hemostasis and closure of the arteriotomy.  I did flush an antegrade and retrograde fashion the iliac and femoral vessels to the arteriotomy. Once closure was completed, 50 mg of protamine was administered. I continued the manual pressure for approximately 10 additional minutes. At this point, there was complete hemostasis of the left groin. The patient had palpable pedal pulses. The skin incision was closed with 4-0 Vicryl. Dermabond was applied. The patient was successfully awakened from anesthesia and found to be moving both lower extremities to command.   Disposition:  To PACU in stable condition.   Juleen China, M.D. [facility] Office: [phone] Pager:  [phone] 2

## 2012-10-03 ENCOUNTER — Encounter (HOSPITAL_COMMUNITY): Payer: Self-pay | Admitting: Surgery

## 2012-10-03 LAB — CBC
HCT: 36.6 % (ref 36.0–46.0)
Hemoglobin: 12.2 g/dL (ref 12.0–15.0)
MCH: 32 pg (ref 26.0–34.0)
MCHC: 33.3 g/dL (ref 30.0–36.0)
MCV: 96.1 fL (ref 78.0–100.0)
Platelets: 143 10*3/uL — ABNORMAL LOW (ref 150–400)
RBC: 3.81 MIL/uL — ABNORMAL LOW (ref 3.87–5.11)
RDW: 13.2 % (ref 11.5–15.5)
WBC: 12.1 10*3/uL — ABNORMAL HIGH (ref 4.0–10.5)

## 2012-10-03 LAB — BASIC METABOLIC PANEL
BUN: 18 mg/dL (ref 6–23)
CO2: 25 mEq/L (ref 19–32)
Calcium: 8.9 mg/dL (ref 8.4–10.5)
Chloride: 103 mEq/L (ref 96–112)
Creatinine, Ser: 0.95 mg/dL (ref 0.50–1.10)
GFR calc Af Amer: 65 mL/min — ABNORMAL LOW (ref >90)
GFR calc non Af Amer: 56 mL/min — ABNORMAL LOW (ref >90)
Glucose, Bld: 102 mg/dL — ABNORMAL HIGH (ref 70–99)
Potassium: 4 mEq/L (ref 3.5–5.1)
Sodium: 139 mEq/L (ref 135–145)

## 2012-10-03 MED ORDER — ENOXAPARIN SODIUM 60 MG/0.6ML ~~LOC~~ SOLN
60.0000 mg | Freq: Two times a day (BID) | SUBCUTANEOUS | Status: DC
  Administered 2012-10-03 (×2): 60 mg via SUBCUTANEOUS
  Filled 2012-10-03 (×7): qty 0.6

## 2012-10-03 MED ORDER — WARFARIN SODIUM 7.5 MG PO TABS
7.5000 mg | ORAL_TABLET | Freq: Once | ORAL | Status: DC
  Filled 2012-10-03 (×2): qty 1

## 2012-10-03 MED ORDER — FUROSEMIDE 20 MG PO TABS
20.0000 mg | ORAL_TABLET | Freq: Every day | ORAL | Status: DC
  Administered 2012-10-04 – 2012-10-07 (×4): 20 mg via ORAL
  Filled 2012-10-03 (×6): qty 1

## 2012-10-03 MED ORDER — WARFARIN - PHARMACIST DOSING INPATIENT
Freq: Every day | Status: DC
  Administered 2012-10-03: 17:00:00

## 2012-10-03 MED ORDER — WARFARIN SODIUM 5 MG PO TABS
5.0000 mg | ORAL_TABLET | Freq: Once | ORAL | Status: AC
  Administered 2012-10-03: 5 mg via ORAL
  Filled 2012-10-03: qty 1

## 2012-10-03 NOTE — Evaluation (Signed)
Physical Therapy Evaluation Patient Details Name: [name] [name] MRN: [id number] DOB: Apr 04, 1932 Today's Date: 10/03/2012 Time: 4696-2952 PT Time Calculation (min): 29 min  PT Assessment / Plan / Recommendation Clinical Impression  pt admitted for surgical management of desc. aortic aneurysm.  Post op, pt is weak and deconditioned.  She can manage the barriers in her home with the help of her daughters with her 14 steps presenting the biggest challenge.  Pt could benefit from HHPT to address strength and conditioning issues.    PT Assessment  All further PT needs can be met in the next venue of care    Follow Up Recommendations  Home health PT;Supervision for mobility/OOB    Does the patient have the potential to tolerate intense rehabilitation      Barriers to Discharge        Equipment Recommendations  None recommended by PT    Recommendations for Other Services     Frequency      Precautions / Restrictions Precautions Precautions: Fall   Pertinent Vitals/Pain       Mobility  Bed Mobility Bed Mobility: Supine to Sit;Sitting - Scoot to Edge of Bed Supine to Sit: 5: Supervision;HOB flat Sitting - Scoot to Edge of Bed: 5: Supervision;With rail Details for Bed Mobility Assistance: safe mobility Transfers Transfers: Sit to Stand;Stand to Sit Sit to Stand: 4: Min guard Stand to Sit: 4: Min guard;With upper extremity assist;To bed Details for Transfer Assistance: safe mobility though noticeably weak Ambulation/Gait Ambulation/Gait Assistance: 4: Min guard;4: Min assist Ambulation Distance (Feet): 250 Feet Assistive device: 1 person hand held assist;None Ambulation/Gait Assistance Details: initially min assist and instability, then pt became mor steady with min guard before fatiguing and needing min HHA again With weak kneed gait esp RLE Gait Pattern: Step-through pattern Stairs: Yes Stairs Assistance: 4: Min assist Stairs Assistance Details (indicate cue type and  reason): step to pattern due to weak R LE Stair Management Technique: One rail Right;Step to pattern;Forwards Number of Stairs: 2 (due to fatigue)    Exercises     PT Diagnosis: Difficulty walking;Generalized weakness  PT Problem List: Decreased strength;Decreased activity tolerance;Decreased mobility;Decreased knowledge of use of DME PT Treatment Interventions:     PT Goals    Visit Information  Last PT Received On: 10/03/12 Assistance Needed: +1    Subjective Data  Subjective: Oh I can't go up any stairs Patient Stated Goal: Home and able to do for myself   Prior Functioning  Home Living Lives With: Alone Available Help at Discharge: Family;Other (Comment) (3 daughters taking 4 day shifts and then caregiver assist ) Type of Home: House Home Access: Stairs to enter [employer] of Steps: 4 Entrance Stairs-Rails: Right;Left Home Layout: Two level Alternate Level Stairs-Number of Steps: 14 Alternate Level Stairs-Rails: Right;Left Bathroom Shower/Tub: Tub/shower unit Home Adaptive Equipment: Straight cane Prior Function Level of Independence: Independent Able to Take Stairs?: Yes Driving: Yes Communication Communication: No difficulties    Cognition  Cognition Overall Cognitive Status: Appears within functional limits for tasks assessed/performed Arousal/Alertness: Awake/alert Orientation Level: Appears intact for tasks assessed Behavior During Session: [facility] for tasks performed    Extremity/Trunk Assessment Right Upper Extremity Assessment RUE ROM/Strength/Tone: Within functional levels (LUE weaker than R UE) Left Upper Extremity Assessment LUE ROM/Strength/Tone: Within functional levels Right Lower Extremity Assessment RLE ROM/Strength/Tone: Within functional levels (R LE weaker at 4 to 4+5 THAN L LE) Left Lower Extremity Assessment LLE ROM/Strength/Tone: Within functional levels Trunk Assessment Trunk Assessment: Normal  Balance Balance Balance  Assessed: No  End of Session PT - End of Session Activity Tolerance: Patient tolerated treatment well;Patient limited by fatigue Patient left: with call bell/phone within reach;with family/visitor present (sitting EOB) Nurse Communication: Mobility status  GP     Shawnelle Spoerl, Eliseo Gum 10/03/2012, 4:33 PM 10/03/2012  [place] Bing, PT [phone] (pager)
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
## 2012-10-03 NOTE — Progress Notes (Signed)
Patient ID: [name] [name], female   DOB: 1932/01/12, 79 y.o.   MRN: [id number]                   [address]            [place] [address]          [phone]     1 Day Post-Op Procedure(s) (LRB): THORACIC AORTIC ENDOVASCULAR STENT GRAFT (N/A)  LOS: 1 day   Subjective: Feels well, walked 250 feet but felt weak  Objective: Vital signs in last 24 hours: Patient Vitals for the past 24 hrs:  BP Temp Temp src Pulse Resp SpO2 Height Weight  10/03/12 1330 114/65 mmHg 97.5 F (36.4 C) Oral 85 18 94 % - -  10/03/12 1156 116/75 mmHg - - 89 16 93 % - -  10/03/12 1000 107/52 mmHg - - 71 16 93 % 5\' 4"  (1.626 m) 129 lb 13.6 oz (58.9 kg)  10/03/12 0800 110/55 mmHg - - 73 13 93 % - -  10/03/12 0750 - 97.7 F (36.5 C) Oral - - - - -  10/03/12 0700 - - - 62 10 98 % - -  10/03/12 0600 - - - 78 12 99 % - 130 lb (58.968 kg)  10/03/12 0500 - - - 77 13 99 % - -  10/03/12 0400 104/57 mmHg 98.1 F (36.7 C) Oral 75 11 99 % - -  10/03/12 0300 - - - 78 12 98 % - -  10/03/12 0200 - - - 79 11 98 % - -  10/03/12 0100 - - - 85 18 97 % - -  10/03/12 0000 117/72 mmHg 98.3 F (36.8 C) Oral 79 16 90 % - -  10/02/12 2300 - - - 73 12 99 % - -  10/02/12 2200 - - - 79 13 100 % - -  10/02/12 2100 - - - 71 11 99 % - -  10/02/12 2000 127/50 mmHg 98.1 F (36.7 C) Oral 69 21 93 % - -  10/02/12 1900 - - - 77 18 95 % - -    Filed Weights   10/03/12 0600 10/03/12 1000  Weight: 130 lb (58.968 kg) 129 lb 13.6 oz (58.9 kg)    Hemodynamic parameters for last 24 hours:    Intake/Output from previous day: 03/20 0701 - 03/21 0700 In: 3508 [P.O.:520; I.V.:2638; IV Piggyback:350] Out: 1080 [Urine:1080] Intake/Output this shift: Total I/O In: 635 [P.O.:560; I.V.:75] Out: 300 [Urine:300]  Scheduled Meds: . digoxin  125 mcg Oral QHS  . docusate sodium  100 mg Oral Daily  . enoxaparin (LOVENOX) injection  60 mg Subcutaneous Q12H  . furosemide  20 mg Oral Daily  . pantoprazole  40 mg Oral Daily    . prazosin  2 mg Oral BID  . propranolol  10 mg Oral Daily  . propranolol ER  160 mg Oral QHS  . spironolactone  12.5 mg Oral q morning - 10a  . Warfarin - Pharmacist Dosing Inpatient   Does not apply q1800   Continuous Infusions: . sodium chloride 75 mL/hr at 10/03/12 0400   PRN Meds:.acetaminophen, acetaminophen, alum & mag hydroxide-simeth, guaiFENesin-dextromethorphan, hydrALAZINE, morphine injection, ondansetron, phenol, traMADol  General appearance: alert and cooperative Neurologic: intact Heart: irregularly irregular rhythm Lungs: clear to auscultation bilaterally and normal percussion bilaterally Abdomen: soft, non-tender; bowel sounds normal; no masses,  no organomegaly Extremities: normal sesensation and able to lift against resistance Wound: no hematoma left groin  Lab Results: CBC: Recent Labs  10/02/12 1030 10/03/12 0410  WBC 7.5 12.1*  HGB 12.3 12.2  HCT 35.8* 36.6  PLT 157 143*   BMET:  Recent Labs  10/02/12 1030 10/03/12 0410  NA 138 139  K 4.0 4.0  CL 104 103  CO2 25 25  GLUCOSE 123* 102*  BUN 19 18  CREATININE 0.85 0.95  CALCIUM 8.8 8.9    PT/INR:  Recent Labs  10/02/12 1030  LABPROT 15.6*  INR 1.27     Radiology Dg Chest Portable 1 View  10/02/2012  *RADIOLOGY REPORT*  Clinical Data: Status post thoracic aortic stent graft placement.  PORTABLE CHEST - 1 VIEW  Comparison: Chest x-ray 08/22/2012.  Findings: Compared to the prior examination there is now a stent graft extending from the distal aortic arch into the distal thoracic aorta.  The previously noted aneurysm of the mid descending thoracic aorta appears slightly less prominent measuring approximately 7.9 cm in diameter on today's examination on the frontal projection.  Lung volumes are low.  No pneumothorax.  No definite consolidative airspace disease.  Minimal bibasilar (left greater than right) subsegmental atelectasis.  Possible small left pleural effusion.  Pulmonary venous  congestion with mild indistinctness of the interstitial markings which may suggest a very mild interstitial pulmonary edema.  Thickening of the minor fissure.  Moderate cardiomegaly.  The patient is rotated to the right on today's exam, resulting in distortion of the mediastinal contours and reduced diagnostic sensitivity and specificity for mediastinal pathology.  Right IJ central venous Cordis in position with tip terminating near the confluence of the innominate veins.  IMPRESSION: 1.  Post procedural changes of thoracic aortic stent graft, as above, without acute complicating features. 2.  Moderate cardiomegaly with evidence to suggest mild interstitial pulmonary edema. 3.  Additional findings, as above.   Original Report Authenticated By: Trudie Reed, M.D.      Assessment/Plan: S/P Procedure(s) (LRB): THORACIC AORTIC ENDOVASCULAR STENT GRAFT (N/A) Mobilize Diuresis Stable on 2000 Pt seeing to help get moving Back on coumadin for chronic a fib   Delight Ovens MD 10/03/2012 6:25 PM

## 2012-10-03 NOTE — Progress Notes (Signed)
[facility]  Subjective  - POD #1 s/p perc TEVAR  C/o neck pain from central line last night which was removed Sat in chair last night C/o pain in abdomen where lovenox injection are   Physical Exam:  Left groin soft Palpable pedal pulses Abdomen soft, ecchymosis around lovenox injection site Lower extremities with equal 5/5 strength    Assessment/Plan:  POD #1  AFIB:  Will restart lovenox and coumadin today Pulmonary edema:  Mild by xray yesterday, will continue home diuretics D/c foley OOB walking in halls D/c A-line Likely d/c in am PT to see and have her walk in stairs  BRABHAM IV, V. WELLS 10/03/2012 7:15 AM --  Filed Vitals:   10/03/12 0700  BP:   Pulse: 62  Temp:   Resp: 10    Intake/Output Summary (Last 24 hours) at 10/03/12 0715 Last data filed at 10/03/12 0700  Gross per 24 hour  Intake   3508 ml  Output   1080 ml  Net   2428 ml     Laboratory CBC    Component Value Date/Time   WBC 12.1* 10/03/2012 0410   HGB 12.2 10/03/2012 0410   HCT 36.6 10/03/2012 0410   PLT 143* 10/03/2012 0410    BMET    Component Value Date/Time   NA 139 10/03/2012 0410   K 4.0 10/03/2012 0410   CL 103 10/03/2012 0410   CO2 25 10/03/2012 0410   GLUCOSE 102* 10/03/2012 0410   BUN 18 10/03/2012 0410   CREATININE 0.95 10/03/2012 0410   CREATININE 0.97 10/05/2010 0902   CALCIUM 8.9 10/03/2012 0410   GFRNONAA 56* 10/03/2012 0410   GFRAA 65* 10/03/2012 0410    COAG Lab Results  Component Value Date   INR 1.27 10/02/2012   INR 1.14 10/02/2012   INR 3.0 09/26/2012   No results found for this basename: PTT    Antibiotics Anti-infectives   Start     Dose/Rate Route Frequency Ordered Stop   10/02/12 1800  cefUROXime (ZINACEF) 1.5 g in dextrose 5 % 50 mL IVPB     1.5 g 100 mL/hr over 30 Minutes Intravenous Every 12 hours 10/02/12 1006 10/03/12 0630   10/01/12 1428  cefUROXime (ZINACEF) 1.5 g in dextrose 5 % 50 mL IVPB     1.5 g 100 mL/hr  over 30 Minutes Intravenous 30 min pre-op 10/01/12 1428 10/02/12 0741       V. Charlena Cross, M.D. [facility] Office: [phone] Pager:  [phone]

## 2012-10-04 ENCOUNTER — Inpatient Hospital Stay (HOSPITAL_COMMUNITY): Payer: Medicare Other

## 2012-10-04 LAB — PROTIME-INR: INR: 1.17 (ref 0.00–1.49)

## 2012-10-04 LAB — TYPE AND SCREEN
Antibody Screen: NEGATIVE
Unit division: 0

## 2012-10-04 LAB — CBC
HCT: 37.3 % (ref 36.0–46.0)
Platelets: 129 10*3/uL — ABNORMAL LOW (ref 150–400)
RBC: 3.86 MIL/uL — ABNORMAL LOW (ref 3.87–5.11)
RDW: 13.2 % (ref 11.5–15.5)
WBC: 15.1 10*3/uL — ABNORMAL HIGH (ref 4.0–10.5)

## 2012-10-04 LAB — BASIC METABOLIC PANEL
CO2: 26 mEq/L (ref 19–32)
Chloride: 99 mEq/L (ref 96–112)
GFR calc Af Amer: 73 mL/min — ABNORMAL LOW (ref >90)
Potassium: 4.3 mEq/L (ref 3.5–5.1)

## 2012-10-04 MED ORDER — HYDROMORPHONE HCL PF 1 MG/ML IJ SOLN
1.0000 mg | INTRAMUSCULAR | Status: DC | PRN
  Administered 2012-10-04: 2 mg via INTRAVENOUS
  Administered 2012-10-05 – 2012-10-06 (×2): 1 mg via INTRAVENOUS
  Filled 2012-10-04 (×2): qty 1

## 2012-10-04 MED ORDER — HYDROMORPHONE HCL PF 1 MG/ML IJ SOLN
INTRAMUSCULAR | Status: AC
  Filled 2012-10-04: qty 2

## 2012-10-04 MED ORDER — IOHEXOL 350 MG/ML SOLN
75.0000 mL | Freq: Once | INTRAVENOUS | Status: AC | PRN
  Administered 2012-10-04: 75 mL via INTRAVENOUS

## 2012-10-04 MED ORDER — ENOXAPARIN SODIUM 60 MG/0.6ML ~~LOC~~ SOLN
60.0000 mg | Freq: Two times a day (BID) | SUBCUTANEOUS | Status: DC
  Filled 2012-10-04 (×2): qty 0.6

## 2012-10-04 MED ORDER — ENOXAPARIN SODIUM 60 MG/0.6ML ~~LOC~~ SOLN
60.0000 mg | Freq: Once | SUBCUTANEOUS | Status: AC
  Administered 2012-10-04: 60 mg via SUBCUTANEOUS
  Filled 2012-10-04: qty 0.6

## 2012-10-04 NOTE — Progress Notes (Signed)
Patient ID: [name] [name], female   DOB: February 28, 1932, 79 y.o.   MRN: [id number] Vascular Surgery Progress Note  Subjective: Abdominal and back pain now relieved  Objective:  Filed Vitals:   10/04/12 0751  BP: 104/70  Pulse: 89  Temp:   Resp:     General alert and oriented x3 Abdomen soft nontender patient comfortable reading newspaper   Labs:  Recent Labs Lab 10/02/12 1030 10/03/12 0410 10/04/12 0715  CREATININE 0.85 0.95 0.86    Recent Labs Lab 10/02/12 1030 10/03/12 0410 10/04/12 0715  NA 138 139 134*  K 4.0 4.0 4.3  CL 104 103 99  CO2 25 25 26   BUN 19 18 14   CREATININE 0.85 0.95 0.86  GLUCOSE 123* 102* 121*  CALCIUM 8.8 8.9 9.1    Recent Labs Lab 10/02/12 1030 10/03/12 0410 10/04/12 0715  WBC 7.5 12.1* 15.1*  HGB 12.3 12.2 12.6  HCT 35.8* 36.6 37.3  PLT 157 143* 129*    Recent Labs Lab 10/02/12 0615 10/02/12 1030 10/04/12 0600  INR 1.14 1.27 1.17    I/O last 3 completed shifts: In: 2208 [P.O.:1220; I.V.:938; IV Piggyback:50] Out: 1140 [Urine:1140]  Imaging: Ct Angio Abdomen W/cm &/or Wo Contrast  10/04/2012  *RADIOLOGY REPORT*  Clinical Data:  Thoracic aortic stent graft.  Abdominal pain.  CT ANGIOGRAPHY CHEST AND ABDOMEN  Technique:  Multidetector CT imaging of the chest and abdomen was performed using the standard protocol during bolus administration of intravenous contrast.  Multiplanar CT image reconstructions including MIPs were obtained to evaluate the vascular anatomy.  Contrast: 75mL OMNIPAQUE IOHEXOL 350 MG/ML SOLN,  Comparison:  Preop 09/03/2012  CTA CHEST  Findings:  Interval placement of a stent graft in the descending thoracic aorta., across a distal descending thoracic aortic aneurysm.  Maximum native aneurysm sac transverse diameter 7.7 cm (previously 7.8 cm) there is a significant endoleak into the native aneurysm sac at the level of   overlap of graft components.  There is incomplete apposition of the distal tines of the graft to  the supraceliac abdominal aorta, but this is not contiguous with the aneurysm sac.  There is heavy atheromatous calcification in coronary arteries, ascending aorta, and aortic arch.  Classic three-vessel brachiocephalic arterial origin anatomy without high-grade proximal stenosis.  Adequate contrast opacification of the proximal pulmonary artery branches; exam was not optimized for detection of pulmonary emboli.  New small pleural effusion.  Stable small pericardial effusion.  Minimal dependent atelectasis posteriorly in both lower lobes.  Lungs otherwise clear.  No mediastinal hematoma. No hilar or mediastinal adenopathy.   Review of the MIP images confirms the above findings.  IMPRESSION:  1.  Interval descending thoracic aortic stent graft placement with stable native aneurysm sac diameter, probable type 3 endoleak. I discussed the findings with Dr. Hart Rochester at the time of interpretation. 2.  New small bilateral pleural effusions.  CTA ABDOMEN  Arterial findings: Aorta:                   Termination of the thoracic stent graft in the supraceliac abdominal aorta as described above.  There is a small amount of thrombus just distal to the stent graft posterolaterally on the right, which was present preoperatively. Fairly heavy calcified plaque throughout the distal abdominal aorta with a small fusiform infrarenal aneurysm 2.9 cm maximal transverse diameter with some eccentric nonocclusive mural thrombus.  Celiac axis:            Mild ostial plaque, patent distally, unremarkable distal  branching.  Superior mesenteric:Widely patent, classic distal branching.  Left renal:             Single, patent  Right renal:            Mild partially calcified ostial plaque, patent distally.  Inferior mesenteric:Patent, ostial narrowing related to the calcified aortic wall plaque.  Left iliac:             Visualized proximal common iliac has heavily calcified plaque, no stenosis or dissection.  Right iliac:            Eccentric  calcified plaque in the common iliac without dissection, aneurysm or stenosis.  Venous findings:  Patent renal veins, portal vein, superior mesenteric vein, splenic vein.  Review of the MIP images confirms the above findings.  Nonvascular findings: Spondylitic changes in the lumbar spine with grade 1 anterolisthesis L4-5 and L5-S1. There is new periportal edema.  There is a trace amount of perihepatic ascites which is new.? Unremarkable spleen, pancreas, adrenal glands, renal parenchyma.  The right kidney is ptotic.  Stomach and visualized segments of small bowel and colon are nondilated.  No adenopathy.  IMPRESSION: 1.  Thoracic stent graft placement to the supraceliac abdominal aorta without complicating features in the abdomen. 2.  No significant proximal visceral or renal artery occlusive disease. 3.  New periportal edema, and small amount of perihepatic ascites, nonspecific.   Original Report Authenticated By: D. Andria Rhein, MD    Dg Chest Portable 1 View  10/02/2012  *RADIOLOGY REPORT*  Clinical Data: Status post thoracic aortic stent graft placement.  PORTABLE CHEST - 1 VIEW  Comparison: Chest x-ray 08/22/2012.  Findings: Compared to the prior examination there is now a stent graft extending from the distal aortic arch into the distal thoracic aorta.  The previously noted aneurysm of the mid descending thoracic aorta appears slightly less prominent measuring approximately 7.9 cm in diameter on today's examination on the frontal projection.  Lung volumes are low.  No pneumothorax.  No definite consolidative airspace disease.  Minimal bibasilar (left greater than right) subsegmental atelectasis.  Possible small left pleural effusion.  Pulmonary venous congestion with mild indistinctness of the interstitial markings which may suggest a very mild interstitial pulmonary edema.  Thickening of the minor fissure.  Moderate cardiomegaly.  The patient is rotated to the right on today's exam, resulting in distortion  of the mediastinal contours and reduced diagnostic sensitivity and specificity for mediastinal pathology.  Right IJ central venous Cordis in position with tip terminating near the confluence of the innominate veins.  IMPRESSION: 1.  Post procedural changes of thoracic aortic stent graft, as above, without acute complicating features. 2.  Moderate cardiomegaly with evidence to suggest mild interstitial pulmonary edema. 3.  Additional findings, as above.   Original Report Authenticated By: Trudie Reed, M.D.    Ct Angio Chest Aortic Dissect W &/or W/o  10/04/2012  *RADIOLOGY REPORT*  Clinical Data:  Thoracic aortic stent graft.  Abdominal pain.  CT ANGIOGRAPHY CHEST AND ABDOMEN  Technique:  Multidetector CT imaging of the chest and abdomen was performed using the standard protocol during bolus administration of intravenous contrast.  Multiplanar CT image reconstructions including MIPs were obtained to evaluate the vascular anatomy.  Contrast: 75mL OMNIPAQUE IOHEXOL 350 MG/ML SOLN,  Comparison:  Preop 09/03/2012  CTA CHEST  Findings:  Interval placement of a stent graft in the descending thoracic aorta., across a distal descending thoracic aortic aneurysm.  Maximum native aneurysm sac transverse diameter 7.7 cm (  previously 7.8 cm) there is a significant endoleak into the native aneurysm sac at the level of   overlap of graft components.  There is incomplete apposition of the distal tines of the graft to the supraceliac abdominal aorta, but this is not contiguous with the aneurysm sac.  There is heavy atheromatous calcification in coronary arteries, ascending aorta, and aortic arch.  Classic three-vessel brachiocephalic arterial origin anatomy without high-grade proximal stenosis.  Adequate contrast opacification of the proximal pulmonary artery branches; exam was not optimized for detection of pulmonary emboli.  New small pleural effusion.  Stable small pericardial effusion.  Minimal dependent atelectasis  posteriorly in both lower lobes.  Lungs otherwise clear.  No mediastinal hematoma. No hilar or mediastinal adenopathy.   Review of the MIP images confirms the above findings.  IMPRESSION:  1.  Interval descending thoracic aortic stent graft placement with stable native aneurysm sac diameter, probable type 3 endoleak. I discussed the findings with Dr. Hart Rochester at the time of interpretation. 2.  New small bilateral pleural effusions.  CTA ABDOMEN  Arterial findings: Aorta:                   Termination of the thoracic stent graft in the supraceliac abdominal aorta as described above.  There is a small amount of thrombus just distal to the stent graft posterolaterally on the right, which was present preoperatively. Fairly heavy calcified plaque throughout the distal abdominal aorta with a small fusiform infrarenal aneurysm 2.9 cm maximal transverse diameter with some eccentric nonocclusive mural thrombus.  Celiac axis:            Mild ostial plaque, patent distally, unremarkable distal branching.  Superior mesenteric:Widely patent, classic distal branching.  Left renal:             Single, patent  Right renal:            Mild partially calcified ostial plaque, patent distally.  Inferior mesenteric:Patent, ostial narrowing related to the calcified aortic wall plaque.  Left iliac:             Visualized proximal common iliac has heavily calcified plaque, no stenosis or dissection.  Right iliac:            Eccentric calcified plaque in the common iliac without dissection, aneurysm or stenosis.  Venous findings:  Patent renal veins, portal vein, superior mesenteric vein, splenic vein.  Review of the MIP images confirms the above findings.  Nonvascular findings: Spondylitic changes in the lumbar spine with grade 1 anterolisthesis L4-5 and L5-S1. There is new periportal edema.  There is a trace amount of perihepatic ascites which is new.? Unremarkable spleen, pancreas, adrenal glands, renal parenchyma.  The right kidney is  ptotic.  Stomach and visualized segments of small bowel and colon are nondilated.  No adenopathy.  IMPRESSION: 1.  Thoracic stent graft placement to the supraceliac abdominal aorta without complicating features in the abdomen. 2.  No significant proximal visceral or renal artery occlusive disease. 3.  New periportal edema, and small amount of perihepatic ascites, nonspecific.   Original Report Authenticated By: D. Andria Rhein, MD     Assessment/Plan:  POD #2  LOS: 2 days  s/p Procedure(s): THORACIC AORTIC ENDOVASCULAR STENT GRAFT  Discussed situation with Dr. Myra Gianotti who has reviewed CT angiogram. It appears the patient has type III endoleak-doubt this is responsible for back or abdominal discomfort which patient experienced earlier today He plans to return patient to OR tomorrow to treat this type III  endoleak If patient develops recurrent severe back pain-we will be notified INR today 1.14-Coumadin on hold We'll give Lovenox at 11:30 today but no further doses preoperatively Discussed this with patient and she understands and desires to proceed with OR in a.m.   Josephina Gip, MD 10/04/2012 10:24 AM

## 2012-10-04 NOTE — Progress Notes (Addendum)
                   [address]            [place] [address]          [phone]      2 Days Post-Op Procedure(s) (LRB): THORACIC AORTIC ENDOVASCULAR STENT GRAFT (N/A)  Subjective: Patient no longer has neck pain. She has no complaints this morning.  Objective: Vital signs in last 24 hours: Temp:  [97.5 F (36.4 C)-99.1 F (37.3 C)] 99.1 F (37.3 C) (03/22 0437) Pulse Rate:  [71-89] 89 (03/22 0751) Cardiac Rhythm:  [-] Atrial fibrillation (03/21 2020) Resp:  [16-18] 17 (03/22 0437) BP: (102-120)/(52-75) 104/70 mmHg (03/22 0751) SpO2:  [92 %-98 %] 98 % (03/22 0751) Weight:  [58.9 kg (129 lb 13.6 oz)-63.549 kg (140 lb 1.6 oz)] 63.549 kg (140 lb 1.6 oz) (03/22 0437)   Current Weight  10/04/12 63.549 kg (140 lb 1.6 oz)      Intake/Output from previous day: 03/21 0701 - 03/22 0700 In: 1115 [P.O.:1040; I.V.:75] Out: 650 [Urine:650]   Physical Exam:  Cardiovascular: IRRR IRRR, systolic murmur grade III/VI Pulmonary: Clear to auscultation bilaterally; no rales, wheezes, or rhonchi. Abdomen: Soft, non tender, bowel sounds present. Extremities: No lower extremity edema, pulses present Wound: Dressing is clean and dry.    Lab Results: CBC: Recent Labs  10/02/12 1030 10/03/12 0410  WBC 7.5 12.1*  HGB 12.3 12.2  HCT 35.8* 36.6  PLT 157 143*   BMET:  Recent Labs  10/02/12 1030 10/03/12 0410  NA 138 139  K 4.0 4.0  CL 104 103  CO2 25 25  GLUCOSE 123* 102*  BUN 19 18  CREATININE 0.85 0.95  CALCIUM 8.8 8.9    PT/INR:  Lab Results  Component Value Date   INR 1.17 10/04/2012   INR 1.27 10/02/2012   INR 1.14 10/02/2012   ABG:  INR: Will add last result for INR, ABG once components are confirmed Will add last 4 CBG results once components are confirmed  Assessment/Plan:  1. CV - A fib (she has a history of a fib). On Digoxin 125 mcg daily, Propanolol 10 daily, and Coumadin. INR up to 1.17.CT ordered this morning 2.  Pulmonary - On 2  liters of oxygen via [place]. Wean as tolerates.Encourage incentive spirometer 3.Management per [facility]  ZIMMERMAN,DONIELLE MPA-C 10/04/2012,8:11 AM  I have seen and examined the patient and agree with the assessment and plan as outlined.  For OR in am tomorrow per Dr Terisa Starr H 10/04/2012 12:26 PM

## 2012-10-04 NOTE — Progress Notes (Signed)
Patient ID: ALEASE FAIT, female   DOB: Oct 26, 1931, 79 y.o.   MRN: [id number] Vascular Surgery Progress Note  Subjective: 2 days post thoracic stent graft for thoracic aortic aneurysm Patient complained of abdominal and worsening back discomfort at 6 AM States that she had some lower back discomfort yesterday but this is worse No nausea or vomiting or complaints of lower extremity discomfort  Objective:  Filed Vitals:   10/04/12 0751  BP: 104/70  Pulse: 89  Temp:   Resp:     General alert and oriented x3 Abdomen soft nontender 3+ femoral pulses bilaterally with well perfused lower extremities Chest clear   Labs:  Recent Labs Lab 10/02/12 1030 10/03/12 0410  CREATININE 0.85 0.95    Recent Labs Lab 10/02/12 1030 10/03/12 0410  NA 138 139  K 4.0 4.0  CL 104 103  CO2 25 25  BUN 19 18  CREATININE 0.85 0.95  GLUCOSE 123* 102*  CALCIUM 8.8 8.9    Recent Labs Lab 10/02/12 1030 10/03/12 0410  WBC 7.5 12.1*  HGB 12.3 12.2  HCT 35.8* 36.6  PLT 157 143*    Recent Labs Lab 10/02/12 0615 10/02/12 1030 10/04/12 0600  INR 1.14 1.27 1.17    I/O last 3 completed shifts: In: 2208 [P.O.:1220; I.V.:938; IV Piggyback:50] Out: 1140 [Urine:1140]  Imaging: Ct Angio Abdomen W/cm &/or Wo Contrast  10/04/2012  *RADIOLOGY REPORT*  Clinical Data:  Thoracic aortic stent graft.  Abdominal pain.  CT ANGIOGRAPHY CHEST AND ABDOMEN  Technique:  Multidetector CT imaging of the chest and abdomen was performed using the standard protocol during bolus administration of intravenous contrast.  Multiplanar CT image reconstructions including MIPs were obtained to evaluate the vascular anatomy.  Contrast: 75mL OMNIPAQUE IOHEXOL 350 MG/ML SOLN,  Comparison:  Preop 09/03/2012  CTA CHEST  Findings:  Interval placement of a stent graft in the descending thoracic aorta., across a distal descending thoracic aortic aneurysm.  Maximum native aneurysm sac transverse diameter 7.7 cm (previously 7.8  cm) there is a significant endoleak into the native aneurysm sac at the level of   overlap of graft components.  There is incomplete apposition of the distal tines of the graft to the supraceliac abdominal aorta, but this is not contiguous with the aneurysm sac.  There is heavy atheromatous calcification in coronary arteries, ascending aorta, and aortic arch.  Classic three-vessel brachiocephalic arterial origin anatomy without high-grade proximal stenosis.  Adequate contrast opacification of the proximal pulmonary artery branches; exam was not optimized for detection of pulmonary emboli.  New small pleural effusion.  Stable small pericardial effusion.  Minimal dependent atelectasis posteriorly in both lower lobes.  Lungs otherwise clear.  No mediastinal hematoma. No hilar or mediastinal adenopathy.   Review of the MIP images confirms the above findings.  IMPRESSION:  1.  Interval descending thoracic aortic stent graft placement with stable native aneurysm sac diameter, probable type 3 endoleak. I discussed the findings with Dr. Hart Rochester at the time of interpretation. 2.  New small bilateral pleural effusions.  CTA ABDOMEN  Arterial findings: Aorta:                   Termination of the thoracic stent graft in the supraceliac abdominal aorta as described above.  There is a small amount of thrombus just distal to the stent graft posterolaterally on the right, which was present preoperatively. Fairly heavy calcified plaque throughout the distal abdominal aorta with a small fusiform infrarenal aneurysm 2.9 cm maximal transverse diameter with  some eccentric nonocclusive mural thrombus.  Celiac axis:            Mild ostial plaque, patent distally, unremarkable distal branching.  Superior mesenteric:Widely patent, classic distal branching.  Left renal:             Single, patent  Right renal:            Mild partially calcified ostial plaque, patent distally.  Inferior mesenteric:Patent, ostial narrowing related to the  calcified aortic wall plaque.  Left iliac:             Visualized proximal common iliac has heavily calcified plaque, no stenosis or dissection.  Right iliac:            Eccentric calcified plaque in the common iliac without dissection, aneurysm or stenosis.  Venous findings:  Patent renal veins, portal vein, superior mesenteric vein, splenic vein.  Review of the MIP images confirms the above findings.  Nonvascular findings: Spondylitic changes in the lumbar spine with grade 1 anterolisthesis L4-5 and L5-S1. There is new periportal edema.  There is a trace amount of perihepatic ascites which is new.? Unremarkable spleen, pancreas, adrenal glands, renal parenchyma.  The right kidney is ptotic.  Stomach and visualized segments of small bowel and colon are nondilated.  No adenopathy.  IMPRESSION: 1.  Thoracic stent graft placement to the supraceliac abdominal aorta without complicating features in the abdomen. 2.  No significant proximal visceral or renal artery occlusive disease. 3.  New periportal edema, and small amount of perihepatic ascites, nonspecific.   Original Report Authenticated By: D. Andria Rhein, MD    Dg Chest Portable 1 View  10/02/2012  *RADIOLOGY REPORT*  Clinical Data: Status post thoracic aortic stent graft placement.  PORTABLE CHEST - 1 VIEW  Comparison: Chest x-ray 08/22/2012.  Findings: Compared to the prior examination there is now a stent graft extending from the distal aortic arch into the distal thoracic aorta.  The previously noted aneurysm of the mid descending thoracic aorta appears slightly less prominent measuring approximately 7.9 cm in diameter on today's examination on the frontal projection.  Lung volumes are low.  No pneumothorax.  No definite consolidative airspace disease.  Minimal bibasilar (left greater than right) subsegmental atelectasis.  Possible small left pleural effusion.  Pulmonary venous congestion with mild indistinctness of the interstitial markings which may  suggest a very mild interstitial pulmonary edema.  Thickening of the minor fissure.  Moderate cardiomegaly.  The patient is rotated to the right on today's exam, resulting in distortion of the mediastinal contours and reduced diagnostic sensitivity and specificity for mediastinal pathology.  Right IJ central venous Cordis in position with tip terminating near the confluence of the innominate veins.  IMPRESSION: 1.  Post procedural changes of thoracic aortic stent graft, as above, without acute complicating features. 2.  Moderate cardiomegaly with evidence to suggest mild interstitial pulmonary edema. 3.  Additional findings, as above.   Original Report Authenticated By: Trudie Reed, M.D.    Ct Angio Chest Aortic Dissect W &/or W/o  10/04/2012  *RADIOLOGY REPORT*  Clinical Data:  Thoracic aortic stent graft.  Abdominal pain.  CT ANGIOGRAPHY CHEST AND ABDOMEN  Technique:  Multidetector CT imaging of the chest and abdomen was performed using the standard protocol during bolus administration of intravenous contrast.  Multiplanar CT image reconstructions including MIPs were obtained to evaluate the vascular anatomy.  Contrast: 75mL OMNIPAQUE IOHEXOL 350 MG/ML SOLN,  Comparison:  Preop 09/03/2012  CTA CHEST  Findings:  Interval  placement of a stent graft in the descending thoracic aorta., across a distal descending thoracic aortic aneurysm.  Maximum native aneurysm sac transverse diameter 7.7 cm (previously 7.8 cm) there is a significant endoleak into the native aneurysm sac at the level of   overlap of graft components.  There is incomplete apposition of the distal tines of the graft to the supraceliac abdominal aorta, but this is not contiguous with the aneurysm sac.  There is heavy atheromatous calcification in coronary arteries, ascending aorta, and aortic arch.  Classic three-vessel brachiocephalic arterial origin anatomy without high-grade proximal stenosis.  Adequate contrast opacification of the proximal  pulmonary artery branches; exam was not optimized for detection of pulmonary emboli.  New small pleural effusion.  Stable small pericardial effusion.  Minimal dependent atelectasis posteriorly in both lower lobes.  Lungs otherwise clear.  No mediastinal hematoma. No hilar or mediastinal adenopathy.   Review of the MIP images confirms the above findings.  IMPRESSION:  1.  Interval descending thoracic aortic stent graft placement with stable native aneurysm sac diameter, probable type 3 endoleak. I discussed the findings with Dr. Hart Rochester at the time of interpretation. 2.  New small bilateral pleural effusions.  CTA ABDOMEN  Arterial findings: Aorta:                   Termination of the thoracic stent graft in the supraceliac abdominal aorta as described above.  There is a small amount of thrombus just distal to the stent graft posterolaterally on the right, which was present preoperatively. Fairly heavy calcified plaque throughout the distal abdominal aorta with a small fusiform infrarenal aneurysm 2.9 cm maximal transverse diameter with some eccentric nonocclusive mural thrombus.  Celiac axis:            Mild ostial plaque, patent distally, unremarkable distal branching.  Superior mesenteric:Widely patent, classic distal branching.  Left renal:             Single, patent  Right renal:            Mild partially calcified ostial plaque, patent distally.  Inferior mesenteric:Patent, ostial narrowing related to the calcified aortic wall plaque.  Left iliac:             Visualized proximal common iliac has heavily calcified plaque, no stenosis or dissection.  Right iliac:            Eccentric calcified plaque in the common iliac without dissection, aneurysm or stenosis.  Venous findings:  Patent renal veins, portal vein, superior mesenteric vein, splenic vein.  Review of the MIP images confirms the above findings.  Nonvascular findings: Spondylitic changes in the lumbar spine with grade 1 anterolisthesis L4-5 and L5-S1.  There is new periportal edema.  There is a trace amount of perihepatic ascites which is new.? Unremarkable spleen, pancreas, adrenal glands, renal parenchyma.  The right kidney is ptotic.  Stomach and visualized segments of small bowel and colon are nondilated.  No adenopathy.  IMPRESSION: 1.  Thoracic stent graft placement to the supraceliac abdominal aorta without complicating features in the abdomen. 2.  No significant proximal visceral or renal artery occlusive disease. 3.  New periportal edema, and small amount of perihepatic ascites, nonspecific.   Original Report Authenticated By: D. Andria Rhein, MD     Assessment/Plan:  POD #2  LOS: 2 days  s/p Procedure(s): THORACIC AORTIC ENDOVASCULAR STENT GRAFT  CT angiogram of abdomen and chest ordered and reviewed with Dr. Deanne Coffer. No evidence of new aortic dissection  or enlargement of the aneurysm the patient does have endoleak possibly originating from junction of second and third component Unclear whether this is related to her back discomfort Dr. Myra Gianotti will review these films and decide on disposition Patient on daily Lovenox and Coumadin-Will hold Lovenox until decision is made Have changed pain medication from morphine to Dilaudid Patient very comfortable in bed does not appear to be having severe pain   Josephina Gip, MD 10/04/2012 8:28 AM

## 2012-10-04 NOTE — Progress Notes (Signed)
This morning patient began complaining of worsening sharp pain 9/10 in her abdomen, upper back, and chest.  Pain was unrelieved by 4 mg of Morphine IV.  EKG showed no changes from previous.  Dr. Hart Rochester notified and order received for CT Angio Chest/Abdomen STAT to r/o any changes since surgery.  Accompanied patient down for the procedure.  She remained unrelieved by Morphine IV.  Dr. Hart Rochester saw patient upon her arrival back to the floor and gave a verbal order for Dilaudid IV.  Administered 2 mg Dilaudid IV and this relieved the patient's pain within minutes.  She did state that she felt light-headed.  We checked her vitals and BP stable at 104/70, HR was 89.  O2 sats were low in 80%s, so we applied some oxygen at 2L nasal cannula.  O2 sats now stable at 98% on 2L.  Reported off to Selena Batten, Charity fundraiser for dayshift.  Arva Chafe

## 2012-10-05 ENCOUNTER — Inpatient Hospital Stay (HOSPITAL_COMMUNITY): Payer: Medicare Other | Admitting: Anesthesiology

## 2012-10-05 ENCOUNTER — Encounter (HOSPITAL_COMMUNITY)
Admission: RE | Disposition: A | Discharge status: Skilled Nursing Facility | Payer: Self-pay | Source: Ambulatory Visit | Attending: Surgery

## 2012-10-05 ENCOUNTER — Encounter (HOSPITAL_COMMUNITY): Payer: Self-pay | Admitting: Anesthesiology

## 2012-10-05 ENCOUNTER — Inpatient Hospital Stay (HOSPITAL_COMMUNITY): Payer: Medicare Other

## 2012-10-05 DIAGNOSIS — T82898A Other specified complication of vascular prosthetic devices, implants and grafts, initial encounter: Secondary | ICD-10-CM

## 2012-10-05 HISTORY — PX: THORACIC AORTIC ENDOVASCULAR STENT GRAFT: SHX6112

## 2012-10-05 LAB — BASIC METABOLIC PANEL
BUN: 14 mg/dL (ref 6–23)
CO2: 25 mEq/L (ref 19–32)
CO2: 24 mEq/L (ref 19–32)
Calcium: 8.9 mg/dL (ref 8.4–10.5)
Chloride: 98 mEq/L (ref 96–112)
Chloride: 101 mEq/L (ref 96–112)
Creatinine, Ser: 0.66 mg/dL (ref 0.50–1.10)
GFR calc Af Amer: >90 mL/min (ref >90)
GFR calc non Af Amer: 82 mL/min — ABNORMAL LOW (ref >90)
Glucose, Bld: 102 mg/dL — ABNORMAL HIGH (ref 70–99)
Potassium: 4 mEq/L (ref 3.5–5.1)
Potassium: 3.8 mEq/L (ref 3.5–5.1)
Sodium: 133 mEq/L — ABNORMAL LOW (ref 135–145)
Sodium: 136 mEq/L (ref 135–145)

## 2012-10-05 LAB — PROTIME-INR
INR: 1.28 (ref 0.00–1.49)
Prothrombin Time: 15.1 seconds (ref 11.6–15.2)
Prothrombin Time: 15.7 seconds — ABNORMAL HIGH (ref 11.6–15.2)

## 2012-10-05 LAB — CBC
HCT: 31.9 % — ABNORMAL LOW (ref 36.0–46.0)
Hemoglobin: 11.1 g/dL — ABNORMAL LOW (ref 12.0–15.0)
MCH: 32 pg (ref 26.0–34.0)
MCHC: 34.8 g/dL (ref 30.0–36.0)
MCV: 91.9 fL (ref 78.0–100.0)
Platelets: 144 10*3/uL — ABNORMAL LOW (ref 150–400)
Platelets: 132 10*3/uL — ABNORMAL LOW (ref 150–400)
RBC: 3.66 MIL/uL — ABNORMAL LOW (ref 3.87–5.11)
RBC: 3.47 MIL/uL — ABNORMAL LOW (ref 3.87–5.11)
RDW: 13.2 % (ref 11.5–15.5)
WBC: 12.5 10*3/uL — ABNORMAL HIGH (ref 4.0–10.5)
WBC: 11.6 10*3/uL — ABNORMAL HIGH (ref 4.0–10.5)

## 2012-10-05 LAB — APTT: aPTT: 49 seconds — ABNORMAL HIGH (ref 24–37)

## 2012-10-05 LAB — MAGNESIUM: Magnesium: 1.8 mg/dL (ref 1.5–2.5)

## 2012-10-05 SURGERY — INSERTION, ENDOVASCULAR STENT GRAFT, AORTA, THORACIC
Anesthesia: General | Site: Abdomen | Wound class: Clean

## 2012-10-05 MED ORDER — GUAIFENESIN-DM 100-10 MG/5ML PO SYRP: 15.0000 mL | ORAL_SOLUTION | ORAL | Status: DC | PRN

## 2012-10-05 MED ORDER — SODIUM CHLORIDE 0.9 % IV SOLN: 500.0000 mL | Freq: Once | INTRAVENOUS | Status: AC | PRN

## 2012-10-05 MED ORDER — KCL IN DEXTROSE-NACL 20-5-0.45 MEQ/L-%-% IV SOLN
INTRAVENOUS | Status: DC
  Administered 2012-10-05: 21:00:00 via INTRAVENOUS
  Filled 2012-10-05 (×5): qty 1000

## 2012-10-05 MED ORDER — MIDAZOLAM HCL 5 MG/5ML IJ SOLN
INTRAMUSCULAR | Status: DC | PRN
  Administered 2012-10-05: 2 mg via INTRAVENOUS

## 2012-10-05 MED ORDER — OXYCODONE HCL 5 MG/5ML PO SOLN: 5.0000 mg | Freq: Once | ORAL | Status: AC | PRN

## 2012-10-05 MED ORDER — DOPAMINE-DEXTROSE 3.2-5 MG/ML-% IV SOLN: 3.0000 ug/kg/min | INTRAVENOUS | Status: DC

## 2012-10-05 MED ORDER — LACTATED RINGERS IV SOLN
INTRAVENOUS | Status: DC | PRN
  Administered 2012-10-05: 13:00:00 via INTRAVENOUS

## 2012-10-05 MED ORDER — DEXTROSE 5 % IV SOLN
INTRAVENOUS | Status: AC
  Filled 2012-10-05: qty 50

## 2012-10-05 MED ORDER — TRAMADOL HCL 50 MG PO TABS
50.0000 mg | ORAL_TABLET | Freq: Four times a day (QID) | ORAL | Status: DC | PRN
  Administered 2012-10-06: 50 mg via ORAL
  Filled 2012-10-05: qty 1

## 2012-10-05 MED ORDER — ROCURONIUM BROMIDE 100 MG/10ML IV SOLN
INTRAVENOUS | Status: DC | PRN
  Administered 2012-10-05: 40 mg via INTRAVENOUS

## 2012-10-05 MED ORDER — PROTAMINE SULFATE 10 MG/ML IV SOLN
INTRAVENOUS | Status: DC | PRN
  Administered 2012-10-05: 50 mg via INTRAVENOUS

## 2012-10-05 MED ORDER — FENTANYL CITRATE 0.05 MG/ML IJ SOLN
INTRAMUSCULAR | Status: DC | PRN
  Administered 2012-10-05 (×2): 50 ug via INTRAVENOUS

## 2012-10-05 MED ORDER — HYDRALAZINE HCL 20 MG/ML IJ SOLN: 10.0000 mg | INTRAMUSCULAR | Status: DC | PRN

## 2012-10-05 MED ORDER — DEXTROSE 5 % IV SOLN
1.5000 g | Freq: Two times a day (BID) | INTRAVENOUS | Status: AC
  Administered 2012-10-06 (×2): 1.5 g via INTRAVENOUS
  Filled 2012-10-05 (×2): qty 1.5

## 2012-10-05 MED ORDER — OXYCODONE HCL 5 MG PO TABS: 5.0000 mg | ORAL_TABLET | Freq: Once | ORAL | Status: AC | PRN

## 2012-10-05 MED ORDER — PHENOL 1.4 % MT LIQD
1.0000 | OROMUCOSAL | Status: DC | PRN
  Filled 2012-10-05: qty 177

## 2012-10-05 MED ORDER — FENTANYL CITRATE 0.05 MG/ML IJ SOLN: 25.0000 ug | INTRAMUSCULAR | Status: DC | PRN

## 2012-10-05 MED ORDER — WARFARIN SODIUM 2.5 MG PO TABS
2.5000 mg | ORAL_TABLET | Freq: Every day | ORAL | Status: DC
  Administered 2012-10-06: 2.5 mg via ORAL
  Filled 2012-10-05 (×2): qty 1

## 2012-10-05 MED ORDER — SODIUM CHLORIDE 0.9 % IR SOLN
Status: DC | PRN
  Administered 2012-10-05: 13:00:00

## 2012-10-05 MED ORDER — ALUM & MAG HYDROXIDE-SIMETH 200-200-20 MG/5ML PO SUSP: 15.0000 mL | ORAL | Status: DC | PRN

## 2012-10-05 MED ORDER — IODIXANOL 320 MG/ML IV SOLN
INTRAVENOUS | Status: DC | PRN
  Administered 2012-10-05: 101.2 mL via INTRAVENOUS

## 2012-10-05 MED ORDER — DOCUSATE SODIUM 100 MG PO CAPS
100.0000 mg | ORAL_CAPSULE | Freq: Every day | ORAL | Status: DC
  Administered 2012-10-07: 100 mg via ORAL
  Filled 2012-10-05: qty 1

## 2012-10-05 MED ORDER — PHENYLEPHRINE HCL 10 MG/ML IJ SOLN
10.0000 mg | INTRAVENOUS | Status: DC | PRN
  Administered 2012-10-05: 40 ug/min via INTRAVENOUS

## 2012-10-05 MED ORDER — CEFUROXIME SODIUM 1.5 G IJ SOLR
INTRAMUSCULAR | Status: AC
  Administered 2012-10-05: 1.5 g via INTRAMUSCULAR
  Filled 2012-10-05: qty 1.5

## 2012-10-05 MED ORDER — POTASSIUM CHLORIDE CRYS ER 20 MEQ PO TBCR: 20.0000 meq | EXTENDED_RELEASE_TABLET | Freq: Once | ORAL | Status: AC | PRN

## 2012-10-05 MED ORDER — WARFARIN - PHYSICIAN DOSING INPATIENT: Freq: Every day | Status: DC

## 2012-10-05 MED ORDER — PROPOFOL 10 MG/ML IV BOLUS
INTRAVENOUS | Status: DC | PRN
  Administered 2012-10-05: 10 mg via INTRAVENOUS
  Administered 2012-10-05: 120 mg via INTRAVENOUS
  Administered 2012-10-05: 10 mg via INTRAVENOUS
  Administered 2012-10-05: 50 mg via INTRAVENOUS
  Administered 2012-10-05: 10 mg via INTRAVENOUS
  Administered 2012-10-05: 20 mg via INTRAVENOUS

## 2012-10-05 MED ORDER — LIDOCAINE HCL (CARDIAC) 20 MG/ML IV SOLN
INTRAVENOUS | Status: DC | PRN
  Administered 2012-10-05: 60 mg via INTRAVENOUS

## 2012-10-05 MED ORDER — ONDANSETRON HCL 4 MG/2ML IJ SOLN: 4.0000 mg | Freq: Four times a day (QID) | INTRAMUSCULAR | Status: DC | PRN

## 2012-10-05 MED ORDER — FENTANYL CITRATE 0.05 MG/ML IJ SOLN
INTRAMUSCULAR | Status: AC
  Administered 2012-10-05: 50 ug via INTRAVENOUS
  Filled 2012-10-05: qty 2

## 2012-10-05 MED ORDER — ACETAMINOPHEN 325 MG PO TABS
325.0000 mg | ORAL_TABLET | ORAL | Status: DC | PRN
  Filled 2012-10-05: qty 2

## 2012-10-05 MED ORDER — ACETAMINOPHEN 650 MG RE SUPP: 325.0000 mg | RECTAL | Status: DC | PRN

## 2012-10-05 MED ORDER — PANTOPRAZOLE SODIUM 40 MG PO TBEC
40.0000 mg | DELAYED_RELEASE_TABLET | Freq: Every day | ORAL | Status: DC
  Administered 2012-10-05 – 2012-10-07 (×3): 40 mg via ORAL
  Filled 2012-10-05 (×3): qty 1

## 2012-10-05 MED ORDER — ONDANSETRON HCL 4 MG/2ML IJ SOLN
INTRAMUSCULAR | Status: AC
  Administered 2012-10-05: 4 mg via INTRAVENOUS
  Filled 2012-10-05: qty 2

## 2012-10-05 MED ORDER — HEPARIN SODIUM (PORCINE) 1000 UNIT/ML IJ SOLN
INTRAMUSCULAR | Status: DC | PRN
  Administered 2012-10-05: 6000 [IU] via INTRAVENOUS

## 2012-10-05 MED ORDER — MAGNESIUM SULFATE 40 MG/ML IJ SOLN
2.0000 g | Freq: Once | INTRAMUSCULAR | Status: AC | PRN
  Filled 2012-10-05: qty 50

## 2012-10-05 SURGICAL SUPPLY — 74 items
ADH SKN CLS APL DERMABOND .7 (GAUZE/BANDAGES/DRESSINGS) ×1
ADH SKN CLS LQ APL DERMABOND (GAUZE/BANDAGES/DRESSINGS) ×1
BAG BANDED W/RUBBER/TAPE 36X54 (MISCELLANEOUS) ×5 IMPLANT
BAG DECANTER FOR FLEXI CONT (MISCELLANEOUS) IMPLANT
BAG EQP BAND 135X91 W/RBR TAPE (MISCELLANEOUS) ×3
BAG SNAP BAND KOVER 36X36 (MISCELLANEOUS) ×9 IMPLANT
BALLN CODA OCL 2-9.0-35-120-3 (BALLOONS)
BALLOON COD OCL 2-9.0-35-120-3 (BALLOONS) IMPLANT
CANISTER SUCTION 2500CC (MISCELLANEOUS) ×2 IMPLANT
CATH ACCU-VU SIZ PIG 5F 100CM (CATHETERS) ×1 IMPLANT
CATH BALLN TRILOBE 26-42 (BALLOONS) ×1 IMPLANT
CLIP TI MEDIUM 24 (CLIP) IMPLANT
CLIP TI WIDE RED SMALL 24 (CLIP) IMPLANT
CLOTH BEACON ORANGE TIMEOUT ST (SAFETY) ×2 IMPLANT
COVER DOME SNAP 22 D (MISCELLANEOUS) ×3 IMPLANT
COVER MAYO STAND STRL (DRAPES) ×3 IMPLANT
COVER PROBE W GEL 5X96 (DRAPES) ×2 IMPLANT
COVER SURGICAL LIGHT HANDLE (MISCELLANEOUS) ×2 IMPLANT
DERMABOND ADHESIVE PROPEN (GAUZE/BANDAGES/DRESSINGS) ×1
DERMABOND ADVANCED (GAUZE/BANDAGES/DRESSINGS) ×1
DERMABOND ADVANCED .7 DNX12 (GAUZE/BANDAGES/DRESSINGS) ×1 IMPLANT
DERMABOND ADVANCED .7 DNX6 (GAUZE/BANDAGES/DRESSINGS) IMPLANT
DEVICE CLOSURE PERCLS PRGLD 6F (VASCULAR PRODUCTS) IMPLANT
DRAIN CHANNEL 10F 3/8 F FF (DRAIN) IMPLANT
DRAIN CHANNEL 10M FLAT 3/4 FLT (DRAIN) IMPLANT
DRAPE TABLE COVER HEAVY DUTY (DRAPES) ×3 IMPLANT
DRYSEAL FLEXSHEATH 18FR 33CM (SHEATH) ×1
ELECT REM PT RETURN 9FT ADLT (ELECTROSURGICAL) ×4
ELECTRODE REM PT RTRN 9FT ADLT (ELECTROSURGICAL) ×2 IMPLANT
EVACUATOR 3/16  PVC DRAIN (DRAIN)
EVACUATOR 3/16 PVC DRAIN (DRAIN) IMPLANT
EVACUATOR SILICONE 100CC (DRAIN) IMPLANT
GLOVE BIOGEL PI IND STRL 7.5 (GLOVE) ×1 IMPLANT
GLOVE BIOGEL PI INDICATOR 7.5 (GLOVE) ×1
GLOVE SURG SS PI 7.5 STRL IVOR (GLOVE) ×2 IMPLANT
GOWN PREVENTION PLUS XXLARGE (GOWN DISPOSABLE) ×2 IMPLANT
GOWN STRL NON-REIN LRG LVL3 (GOWN DISPOSABLE) ×8 IMPLANT
GRAFT BALLN CATH 65CM (STENTS) IMPLANT
HEMOSTAT SNOW SURGICEL 2X4 (HEMOSTASIS) IMPLANT
HEMOSTAT SURGICEL 2X14 (HEMOSTASIS) IMPLANT
KIT BASIN OR (CUSTOM PROCEDURE TRAY) ×2 IMPLANT
KIT ROOM TURNOVER OR (KITS) ×2 IMPLANT
NAMIC PROTECTION STATION ×1 IMPLANT
NDL PERC 18GX7CM (NEEDLE) ×1 IMPLANT
NEEDLE PERC 18GX7CM (NEEDLE) ×2 IMPLANT
NS IRRIG 1000ML POUR BTL (IV SOLUTION) ×2 IMPLANT
PACK AORTA (CUSTOM PROCEDURE TRAY) ×2 IMPLANT
PAD ARMBOARD 7.5X6 YLW CONV (MISCELLANEOUS) ×4 IMPLANT
PENCIL BUTTON HOLSTER BLD 10FT (ELECTRODE) IMPLANT
PERCLOSE PROGLIDE 6F (VASCULAR PRODUCTS) ×8
SHEATH AVANTI 11CM 8FR (MISCELLANEOUS) ×1 IMPLANT
SHEATH DRYSEAL FLEX 18FR 33CM (SHEATH) IMPLANT
STAPLER VISISTAT 35W (STAPLE) IMPLANT
STENT GRAFT BALLN CATH 65CM (STENTS)
STOPCOCK MORSE 400PSI 3WAY (MISCELLANEOUS) ×2 IMPLANT
SUT ETHILON 3 0 PS 1 (SUTURE) IMPLANT
SUT PROLENE 5 0 C 1 24 (SUTURE) IMPLANT
SUT VIC AB 2-0 CT1 27 (SUTURE)
SUT VIC AB 2-0 CT1 TAPERPNT 27 (SUTURE) IMPLANT
SUT VIC AB 3-0 SH 27 (SUTURE)
SUT VIC AB 3-0 SH 27X BRD (SUTURE) IMPLANT
SUT VICRYL 4-0 PS2 18IN ABS (SUTURE) ×3 IMPLANT
SYR 20CC LL (SYRINGE) ×4 IMPLANT
SYR 30ML LL (SYRINGE) ×1 IMPLANT
SYR 5ML LL (SYRINGE) IMPLANT
SYR MEDRAD MARK V 150ML (SYRINGE) ×2 IMPLANT
SYRINGE 10CC LL (SYRINGE) ×6 IMPLANT
TOWEL OR 17X24 6PK STRL BLUE (TOWEL DISPOSABLE) ×4 IMPLANT
TOWEL OR 17X26 10 PK STRL BLUE (TOWEL DISPOSABLE) ×4 IMPLANT
TRAY FOLEY CATH 14FRSI W/METER (CATHETERS) ×2 IMPLANT
TUBING HIGH PRESSURE 120CM (CONNECTOR) ×2 IMPLANT
WATER STERILE IRR 1000ML POUR (IV SOLUTION) ×2 IMPLANT
WIRE BENTSON .035X145CM (WIRE) ×1 IMPLANT
WIRE STIFF LUNDERQUIST 260MM (WIRE) ×1 IMPLANT

## 2012-10-05 NOTE — Anesthesia Procedure Notes (Signed)
Procedure Name: Intubation Date/Time: 10/05/2012 1:23 PM Performed by: Alanda Amass A Pre-anesthesia Checklist: Patient identified, Emergency Drugs available, Suction available, Patient being monitored and Timeout performed Patient Re-evaluated:Patient Re-evaluated prior to inductionOxygen Delivery Method: Circle system utilized Preoxygenation: Pre-oxygenation with 100% oxygen Intubation Type: IV induction Ventilation: Mask ventilation without difficulty Laryngoscope Size: Mac and 3 Grade View: Grade I Tube type: Oral Tube size: 7.5 mm Number of attempts: 1 Airway Equipment and Method: Stylet Placement Confirmation: ETT inserted through vocal cords under direct vision,  positive ETCO2 and breath sounds checked- equal and bilateral Secured at: 21 cm Tube secured with: Tape Dental Injury: Teeth and Oropharynx as per pre-operative assessment

## 2012-10-05 NOTE — H&P (Signed)
[facility] [place]   Patient name: [name] [name] MRN: [id number] DOB: June 18, 1932 Sex: female    No chief complaint on file.   HISTORY OF PRESENT ILLNESS: The patient is recently status post endovascular repair of a descending thoracic aortic aneurysm. Her postoperative course had been uncomplicated until the morning of postoperative day 2 she began complaining of abdominal and back pain. This led to a CT angiogram. This shows what is believed to be a type III endoleak from the device overlap. Her abdominal and back pain have improved. There were no other alarming signs on her CT scan. She comes back to the operating room today for repair of her endoleak.  Past Medical History  Diagnosis Date  . AF (atrial fibrillation)   . Hypertension   . Aortic insufficiency   . Mitral regurgitation   . Tricuspid regurgitation   . CHF (congestive heart failure)   . Aortic aneurysm   . CAD (coronary artery disease)   . Arthritis   . Claustrophobia   . Shortness of breath   . Broken neck     40 years ago    Past Surgical History  Procedure Laterality Date  . Tonsillectomy    . Cystectomy      sacrum  . Thoracic aortic endovascular stent graft N/A 10/02/2012    Procedure: THORACIC AORTIC ENDOVASCULAR STENT GRAFT;  Surgeon: Nada Libman, MD;  Location: [facility] OR;  Service: Vascular;  Laterality: N/A;  Ultrasound guided    History   Social History  . Marital Status: Widowed    Spouse Name: N/A    Number of Children: 3  . Years of Education: N/A   Occupational History  . Holiday representative    Social History Main Topics  . Smoking status: Former Smoker -- 1.00 packs/day for 20 years    Types: Cigarettes    Quit date: 08/22/1985  . Smokeless tobacco: Never Used  . Alcohol Use: 4.2 oz/week    7 Glasses of wine per week     Comment: a glass of wine every night  . Drug Use: No  . Sexually Active: Not on file   Other Topics Concern  . Not on file   Social History  Narrative  . No narrative on file    Family History  Problem Relation Age of Onset  . Aneurysm Mother 19  . Cancer Mother     breast  . Heart disease Mother   . Hypertension Mother   . Hodgkin's lymphoma Father 79  . Cancer Father     hodgkins    Allergies as of 09/16/2012 - Review Complete 09/15/2012  Allergen Reaction Noted  . Atorvastatin  11/15/2010  . Amiodarone hcl    . Aspirin  11/08/2010  . Diltiazem hcl    . Flecainide acetate    . Metoprolol succinate    . Sotalol hcl      No current facility-administered medications on file prior to encounter.   Current Outpatient Prescriptions on File Prior to Encounter  Medication Sig Dispense Refill  . acetaminophen (TYLENOL) 500 MG tablet Take 500 mg by mouth every 4 (four) hours as needed for pain.       Marland Kitchen digoxin (LANOXIN) 0.125 MG tablet Take 125 mcg by mouth at bedtime.      . furosemide (LASIX) 20 MG tablet Take 1 tablet (20 mg total) by mouth daily.  90 tablet  3  . prazosin (MINIPRESS) 2 MG capsule Take 1 capsule (2 mg  total) by mouth 2 (two) times daily.  180 capsule  3  . propranolol ER (INDERAL LA) 160 MG SR capsule Take 160 mg by mouth at bedtime.      Marland Kitchen spironolactone (ALDACTONE) 12.5 mg TABS Take 12.5 mg by mouth every morning.      . warfarin (COUMADIN) 5 MG tablet Take 2.5-5 mg by mouth daily. 5mg  Monday, Wednesday, Friday, 2.5 mg all other days.         REVIEW OF SYSTEMS: Mild abdominal and back pain today. All other systems are negative  PHYSICAL EXAMINATION:   Vital signs are BP 125/72  Pulse 80  Temp(Src) 97 F (36.1 C) (Oral)  Resp 17  Ht 5\' 4"  (1.626 m)  Wt 139 lb 8 oz (63.277 kg)  BMI 23.93 kg/m2  SpO2 94% General: The patient appears their stated age. HEENT:  No gross abnormalities Pulmonary:  Non labored breathing Abdomen: Soft and non-tender Musculoskeletal: There are no major deformities. Neurologic: No focal weakness or paresthesias are detected, Skin: There are no ulcer or rashes  noted. Psychiatric: The patient has normal affect. Cardiovascular: There is a regular rate and rhythm without significant murmur appreciated. Palpable pedal pulses   Diagnostic Studies I have reviewed her CT angiogram. She does have a small amount of ascites as well as mild pulmonary edema. I believe that there is a type III endoleak from the device overlap  Assessment: Probable type III endoleak following endovascular thoracic aneurysm repair Plan: I spent approximately 20 minutes with the patient and daughter describing the endoleak. Most likely, this can be repaired with balloon molding of the device overlap. I discussed the potential complications including the risk of bleeding as well as the need for an additional device. They were in full understanding and agreement to proceed.  Jorge Ny, M.D. [facility] [place] Office: [phone] Pager:  [phone]

## 2012-10-05 NOTE — Anesthesia Preprocedure Evaluation (Addendum)
Anesthesia Evaluation  Patient identified by MRN, date of birth, ID band Patient awake    Reviewed: Allergy & Precautions, H&P , NPO status , Patient's Chart, lab work & pertinent test results  Airway Mallampati: II  Neck ROM: full    Dental  (+) Dental Advisory Given   Pulmonary shortness of breath, former smoker,          Cardiovascular hypertension, Pt. on medications and Pt. on home beta blockers + CAD, + Peripheral Vascular Disease and +CHF + dysrhythmias Atrial Fibrillation + Valvular Problems/Murmurs AI     Neuro/Psych Anxiety    GI/Hepatic   Endo/Other    Renal/GU      Musculoskeletal  (+) Arthritis -,   Abdominal   Peds  Hematology   Anesthesia Other Findings   Reproductive/Obstetrics                          Anesthesia Physical Anesthesia Plan  ASA: IV  Anesthesia Plan: General   Post-op Pain Management:    Induction: Intravenous  Airway Management Planned: Oral ETT  Additional Equipment: Arterial line and CVP  Intra-op Plan:   Post-operative Plan: Extubation in OR  Informed Consent: I have reviewed the patients History and Physical, chart, labs and discussed the procedure including the risks, benefits and alternatives for the proposed anesthesia with the patient or authorized representative who has indicated his/her understanding and acceptance.     Plan Discussed with: CRNA and Surgeon  Anesthesia Plan Comments:         Anesthesia Quick Evaluation

## 2012-10-05 NOTE — Anesthesia Postprocedure Evaluation (Signed)
Anesthesia Post Note  Patient: [name] [name]  Procedure(s) Performed: Procedure(s) (LRB): Endovascular repair of THORACIC AORTIC ANEURYSM (N/A)  Anesthesia type: General  Patient location: PACU  Post pain: Pain level controlled and Adequate analgesia  Post assessment: Post-op Vital signs reviewed, Patient's Cardiovascular Status Stable, Respiratory Function Stable, Patent Airway and Pain level controlled  Last Vitals:  Filed Vitals:   10/05/12 1536  BP:   Pulse:   Temp: 36.7 C  Resp: 16    Post vital signs: Reviewed and stable  Level of consciousness: awake, alert  and oriented  Complications: No apparent anesthesia complications

## 2012-10-05 NOTE — Progress Notes (Addendum)
Pt arrived from PACU, VSS, arousable. Strict lie flat orders explained. Right groin level zero. Will continue to monitor.

## 2012-10-05 NOTE — Progress Notes (Signed)
Patient ID: [name] [name], female   DOB: 26-Feb-1932, 79 y.o.   MRN: [id number] Vascular Surgery Progress Note  Subjective: 3 days post thoracic aortic stent graft for thoracic aneurysm-type III endoleak The patient denies any severe abdominal or back symptoms this a.m. Bradycardia 4 return to OR today per Dr. Myra Gianotti  Objective:  Filed Vitals:   10/05/12 0619  BP:   Pulse:   Temp: 97 F (36.1 C)  Resp:     General alert and oriented x3 Abdomen soft nontender 3+ femoral pulses   Labs:  Recent Labs Lab 10/03/12 0410 10/04/12 0715 10/05/12 0538  CREATININE 0.95 0.86 0.85    Recent Labs Lab 10/03/12 0410 10/04/12 0715 10/05/12 0538  NA 139 134* 133*  K 4.0 4.3 4.0  CL 103 99 98  CO2 25 26 25   BUN 18 14 15   CREATININE 0.95 0.86 0.85  GLUCOSE 102* 121* 100*  CALCIUM 8.9 9.1 9.0    Recent Labs Lab 10/03/12 0410 10/04/12 0715 10/05/12 0538  WBC 12.1* 15.1* 12.5*  HGB 12.2 12.6 11.7*  HCT 36.6 37.3 34.0*  PLT 143* 129* 144*    Recent Labs Lab 10/02/12 1030 10/04/12 0600 10/05/12 0538  INR 1.27 1.17 1.21    I/O last 3 completed shifts: In: 240 [P.O.:240] Out: 550 [Urine:550]  Imaging: Ct Angio Abdomen W/cm &/or Wo Contrast  10/04/2012  *RADIOLOGY REPORT*  Clinical Data:  Thoracic aortic stent graft.  Abdominal pain.  CT ANGIOGRAPHY CHEST AND ABDOMEN  Technique:  Multidetector CT imaging of the chest and abdomen was performed using the standard protocol during bolus administration of intravenous contrast.  Multiplanar CT image reconstructions including MIPs were obtained to evaluate the vascular anatomy.  Contrast: 75mL OMNIPAQUE IOHEXOL 350 MG/ML SOLN,  Comparison:  Preop 09/03/2012  CTA CHEST  Findings:  Interval placement of a stent graft in the descending thoracic aorta., across a distal descending thoracic aortic aneurysm.  Maximum native aneurysm sac transverse diameter 7.7 cm (previously 7.8 cm) there is a significant endoleak into the native  aneurysm sac at the level of   overlap of graft components.  There is incomplete apposition of the distal tines of the graft to the supraceliac abdominal aorta, but this is not contiguous with the aneurysm sac.  There is heavy atheromatous calcification in coronary arteries, ascending aorta, and aortic arch.  Classic three-vessel brachiocephalic arterial origin anatomy without high-grade proximal stenosis.  Adequate contrast opacification of the proximal pulmonary artery branches; exam was not optimized for detection of pulmonary emboli.  New small pleural effusion.  Stable small pericardial effusion.  Minimal dependent atelectasis posteriorly in both lower lobes.  Lungs otherwise clear.  No mediastinal hematoma. No hilar or mediastinal adenopathy.   Review of the MIP images confirms the above findings.  IMPRESSION:  1.  Interval descending thoracic aortic stent graft placement with stable native aneurysm sac diameter, probable type 3 endoleak. I discussed the findings with Dr. Hart Rochester at the time of interpretation. 2.  New small bilateral pleural effusions.  CTA ABDOMEN  Arterial findings: Aorta:                   Termination of the thoracic stent graft in the supraceliac abdominal aorta as described above.  There is a small amount of thrombus just distal to the stent graft posterolaterally on the right, which was present preoperatively. Fairly heavy calcified plaque throughout the distal abdominal aorta with a small fusiform infrarenal aneurysm 2.9 cm maximal transverse diameter with some  eccentric nonocclusive mural thrombus.  Celiac axis:            Mild ostial plaque, patent distally, unremarkable distal branching.  Superior mesenteric:Widely patent, classic distal branching.  Left renal:             Single, patent  Right renal:            Mild partially calcified ostial plaque, patent distally.  Inferior mesenteric:Patent, ostial narrowing related to the calcified aortic wall plaque.  Left iliac:              Visualized proximal common iliac has heavily calcified plaque, no stenosis or dissection.  Right iliac:            Eccentric calcified plaque in the common iliac without dissection, aneurysm or stenosis.  Venous findings:  Patent renal veins, portal vein, superior mesenteric vein, splenic vein.  Review of the MIP images confirms the above findings.  Nonvascular findings: Spondylitic changes in the lumbar spine with grade 1 anterolisthesis L4-5 and L5-S1. There is new periportal edema.  There is a trace amount of perihepatic ascites which is new.? Unremarkable spleen, pancreas, adrenal glands, renal parenchyma.  The right kidney is ptotic.  Stomach and visualized segments of small bowel and colon are nondilated.  No adenopathy.  IMPRESSION: 1.  Thoracic stent graft placement to the supraceliac abdominal aorta without complicating features in the abdomen. 2.  No significant proximal visceral or renal artery occlusive disease. 3.  New periportal edema, and small amount of perihepatic ascites, nonspecific.   Original Report Authenticated By: D. Andria Rhein, MD    Ct Angio Chest Aortic Dissect W &/or W/o  10/04/2012  *RADIOLOGY REPORT*  Clinical Data:  Thoracic aortic stent graft.  Abdominal pain.  CT ANGIOGRAPHY CHEST AND ABDOMEN  Technique:  Multidetector CT imaging of the chest and abdomen was performed using the standard protocol during bolus administration of intravenous contrast.  Multiplanar CT image reconstructions including MIPs were obtained to evaluate the vascular anatomy.  Contrast: 75mL OMNIPAQUE IOHEXOL 350 MG/ML SOLN,  Comparison:  Preop 09/03/2012  CTA CHEST  Findings:  Interval placement of a stent graft in the descending thoracic aorta., across a distal descending thoracic aortic aneurysm.  Maximum native aneurysm sac transverse diameter 7.7 cm (previously 7.8 cm) there is a significant endoleak into the native aneurysm sac at the level of   overlap of graft components.  There is incomplete  apposition of the distal tines of the graft to the supraceliac abdominal aorta, but this is not contiguous with the aneurysm sac.  There is heavy atheromatous calcification in coronary arteries, ascending aorta, and aortic arch.  Classic three-vessel brachiocephalic arterial origin anatomy without high-grade proximal stenosis.  Adequate contrast opacification of the proximal pulmonary artery branches; exam was not optimized for detection of pulmonary emboli.  New small pleural effusion.  Stable small pericardial effusion.  Minimal dependent atelectasis posteriorly in both lower lobes.  Lungs otherwise clear.  No mediastinal hematoma. No hilar or mediastinal adenopathy.   Review of the MIP images confirms the above findings.  IMPRESSION:  1.  Interval descending thoracic aortic stent graft placement with stable native aneurysm sac diameter, probable type 3 endoleak. I discussed the findings with Dr. Hart Rochester at the time of interpretation. 2.  New small bilateral pleural effusions.  CTA ABDOMEN  Arterial findings: Aorta:                   Termination of the thoracic stent graft in  the supraceliac abdominal aorta as described above.  There is a small amount of thrombus just distal to the stent graft posterolaterally on the right, which was present preoperatively. Fairly heavy calcified plaque throughout the distal abdominal aorta with a small fusiform infrarenal aneurysm 2.9 cm maximal transverse diameter with some eccentric nonocclusive mural thrombus.  Celiac axis:            Mild ostial plaque, patent distally, unremarkable distal branching.  Superior mesenteric:Widely patent, classic distal branching.  Left renal:             Single, patent  Right renal:            Mild partially calcified ostial plaque, patent distally.  Inferior mesenteric:Patent, ostial narrowing related to the calcified aortic wall plaque.  Left iliac:             Visualized proximal common iliac has heavily calcified plaque, no stenosis or  dissection.  Right iliac:            Eccentric calcified plaque in the common iliac without dissection, aneurysm or stenosis.  Venous findings:  Patent renal veins, portal vein, superior mesenteric vein, splenic vein.  Review of the MIP images confirms the above findings.  Nonvascular findings: Spondylitic changes in the lumbar spine with grade 1 anterolisthesis L4-5 and L5-S1. There is new periportal edema.  There is a trace amount of perihepatic ascites which is new.? Unremarkable spleen, pancreas, adrenal glands, renal parenchyma.  The right kidney is ptotic.  Stomach and visualized segments of small bowel and colon are nondilated.  No adenopathy.  IMPRESSION: 1.  Thoracic stent graft placement to the supraceliac abdominal aorta without complicating features in the abdomen. 2.  No significant proximal visceral or renal artery occlusive disease. 3.  New periportal edema, and small amount of perihepatic ascites, nonspecific.   Original Report Authenticated By: D. Andria Rhein, MD     Assessment/Plan:  POD #3  LOS: 3 days  s/p Procedure(s): THORACIC AORTIC ENDOVASCULAR STENT GRAFT  Return to OR today to treat endoleak. Hematocrit stable at 34%. INR equals 1.2-patient's questions answered   Josephina Gip, MD 10/05/2012 8:19 AM

## 2012-10-05 NOTE — Brief Op Note (Signed)
10/02/2012 - 10/05/2012  3:42 PM  PATIENT:  [name] [name]  79 y.o. female  PRE-OPERATIVE DIAGNOSIS:  endovascular leak. Type III by ct scan  POST-OPERATIVE DIAGNOSIS:  endovascular leak.  PROCEDURE:  Procedure(s) with comments: Endovascular repair of THORACIC AORTIC ANEURYSM (N/A) - Angioplasty of Aortic throasic aneurysm, x1 aortagram,  Percutaneous acess right femoral artery. Aortograms with multiple views before and after   Balloon  Dilations  of Overlap portion of Stent Grafts US guided percutaneous access of rt femoral artery, with perclosure device  SURGEON:  Surgeon(s) and Role:    * Nada Libman, MD - Cosurgeon    * Delight Ovens, MD - Cosurgeon    ASSISTANTS: none   ANESTHESIA:   general  EBL:  Total I/O In: 1200 [I.V.:1200] Out: -   BLOOD ADMINISTERED:none  DRAINS: none   LOCAL MEDICATIONS USED:  NONE  SPECIMEN:  No Specimen  DISPOSITION OF SPECIMEN:  N/A  COUNTS:  YES   DICTATION: .Other Dictation: Dictation Number .  PLAN OF CARE: patient is inpatient  PATIENT DISPOSITION:  PACU - hemodynamically stable.   Delay start of Pharmacological VTE agent (>24hrs) due to surgical blood loss or risk of bleeding: yes

## 2012-10-06 LAB — CBC
HCT: 30.6 % — ABNORMAL LOW (ref 36.0–46.0)
Hemoglobin: 10.7 g/dL — ABNORMAL LOW (ref 12.0–15.0)
MCH: 32.1 pg (ref 26.0–34.0)
MCHC: 35 g/dL (ref 30.0–36.0)
MCV: 91.9 fL (ref 78.0–100.0)
Platelets: 136 10*3/uL — ABNORMAL LOW (ref 150–400)
RBC: 3.33 MIL/uL — ABNORMAL LOW (ref 3.87–5.11)
RDW: 13.3 % (ref 11.5–15.5)
WBC: 9.5 10*3/uL (ref 4.0–10.5)

## 2012-10-06 LAB — BASIC METABOLIC PANEL
BUN: 13 mg/dL (ref 6–23)
CO2: 27 mEq/L (ref 19–32)
Calcium: 8.8 mg/dL (ref 8.4–10.5)
Chloride: 101 mEq/L (ref 96–112)
Creatinine, Ser: 0.73 mg/dL (ref 0.50–1.10)
GFR calc Af Amer: >90 mL/min (ref >90)
GFR calc non Af Amer: 80 mL/min — ABNORMAL LOW (ref >90)
Glucose, Bld: 107 mg/dL — ABNORMAL HIGH (ref 70–99)
Potassium: 3.8 mEq/L (ref 3.5–5.1)
Sodium: 136 mEq/L (ref 135–145)

## 2012-10-06 MED ORDER — WHITE PETROLATUM GEL
Status: AC
  Filled 2012-10-06: qty 5

## 2012-10-06 MED ORDER — OXYCODONE HCL 5 MG PO TABS
5.0000 mg | ORAL_TABLET | ORAL | Status: DC | PRN
  Administered 2012-10-06 – 2012-10-07 (×4): 5 mg via ORAL
  Filled 2012-10-06 (×5): qty 1

## 2012-10-06 MED ORDER — ENOXAPARIN SODIUM 80 MG/0.8ML ~~LOC~~ SOLN
1.0000 mg/kg | Freq: Two times a day (BID) | SUBCUTANEOUS | Status: DC
  Administered 2012-10-06 – 2012-10-07 (×2): 65 mg via SUBCUTANEOUS
  Filled 2012-10-06 (×4): qty 0.8

## 2012-10-06 NOTE — Progress Notes (Signed)
Clinical Child psychotherapist (CSW) informed that pt needing either CIR or SNF before returning home. CSW met with pt and daughter [name] [name] who are both agreeable to either. CSW to do a SNF search first thing tomorrow morning as a back-up if CIR unable to offer placement.  Full assessment to follow.  Theresia Bough, MSW, Theresia Majors [phone]

## 2012-10-06 NOTE — Evaluation (Signed)
Physical Therapy Re-Evaluation Patient Details Name: [name] [name] MRN: [id number] DOB: 05-10-1932 Today's Date: 10/06/2012 Time: 4098-1191 PT Time Calculation (min): 20 min  PT Assessment / Plan / Recommendation Clinical Impression  79 y.o. female admitted to [facility] s/p 2 throacic aoritc aneurysm repairs.  She presents today with generalized weakness which intensifies with increased gait distance, decreased balance and decreased endurance.  She has family in town now, but they will all leave by the time she discharges from the hospital.  She has no friends or neighbors who can check on her.  She has a two story home with no bedroom downstairs and I just don't see her doing too well by herselft at home.  She would benefit from a short rehab stay to get her independent enough and strong enough to manage at home.      PT Assessment  Patient needs continued PT services    Follow Up Recommendations  CIR (to get to mod I level)    Does the patient have the potential to tolerate intense rehabilitation     yes  Barriers to Discharge Inaccessible home environment;Decreased caregiver support 2 level home caregiver 4 hours per day only a couple of days per week.  Pt needs 24 hour assist at first and does not currently have the strength to get up and down a full flight of stairs.      Equipment Recommendations  Rolling walker with 5" wheels    Recommendations for Other Services Rehab consult   Frequency Min 3X/week    Precautions / Restrictions Precautions Precautions: Fall   Pertinent Vitals/Pain VSS, pain 8/10 incisional pain, repositioned in recliner chair      Mobility  Bed Mobility Supine to Sit: 5: Supervision;With rails;HOB elevated Sitting - Scoot to Edge of Bed: 5: Supervision;With rail Details for Bed Mobility Assistance: heavy reliance on railing to get to EOB.   Transfers Sit to Stand: 4: Min assist;With upper extremity assist;From bed Stand to Sit: 4: Min assist;With upper  extremity assist;To chair/3-in-1 Details for Transfer Assistance: uncontrolled descent to sit, more assist needed from lower toilet and recliner chair than higher bed.  Pt with posterior lean initially upon standing.   Ambulation/Gait Ambulation/Gait Assistance: 4: Min assist Ambulation Distance (Feet): 200 Feet Assistive device: Rolling walker Ambulation/Gait Assistance Details: min assist needed to steady trunk for balance and to help support trunk as she fatigues with increased gait distance knees become soft and visibly shaky.   Gait Pattern: Step-through pattern;Shuffle;Trunk flexed Gait velocity: less than 1.8 ft/sec putting her at risk for recurrent falls.      Exercises General Exercises - Upper Extremity Shoulder Flexion: AROM;Both;10 reps;Seated General Exercises - Lower Extremity Long Arc Quad: AROM;Both;10 reps;Seated Hip Flexion/Marching: AROM;Both;10 reps;Seated Toe Raises: AROM;Both;10 reps;Seated Heel Raises: AROM;Both;10 reps;Seated   PT Diagnosis: Difficulty walking;Abnormality of gait;Generalized weakness;Acute pain  PT Problem List: Decreased strength;Decreased activity tolerance;Decreased balance;Decreased mobility;Decreased knowledge of use of DME;Pain PT Treatment Interventions: DME instruction;Stair training;Gait training;Functional mobility training;Neuromuscular re-education;Therapeutic activities;Therapeutic exercise;Balance training;Patient/family education   PT Goals Acute Rehab PT Goals PT Goal Formulation: With patient Time For Goal Achievement: 10/20/12 Potential to Achieve Goals: Good Pt will go Supine/Side to Sit: Independently;with HOB 0 degrees PT Goal: Supine/Side to Sit - Progress: Goal set today Pt will go Sit to Supine/Side: Independently;with HOB 0 degrees PT Goal: Sit to Supine/Side - Progress: Goal set today Pt will go Sit to Stand: with modified independence;with upper extremity assist PT Goal: Sit to Stand - Progress: Goal  set today Pt  will go Stand to Sit: with modified independence;with upper extremity assist PT Goal: Stand to Sit - Progress: Goal set today Pt will Transfer Bed to Chair/Chair to Bed: with modified independence PT Transfer Goal: Bed to Chair/Chair to Bed - Progress: Goal set today Pt will Ambulate: >150 feet;with modified independence;with least restrictive assistive device PT Goal: Ambulate - Progress: Goal set today Pt will Go Up / Down Stairs: Flight;with modified independence;with rail(s) PT Goal: Up/Down Stairs - Progress: Goal set today  Visit Information  Last PT Received On: 10/06/12 Assistance Needed: +1    Subjective Data  Subjective: Pt reporting her daughters are in town now and that no one will be there for her at discharge.  We spoke at length about maybe going to rehab before going home because she is so weak and deconditioned.  Her bedroom is up a "steep" flight of stairs.     Prior Functioning  Home Living Lives With: Alone Available Help at Discharge: Family;Other (Comment) (3 daughters taking 4 day shifts and then caregiver assist ) Type of Home: House Home Access: Stairs to enter [employer] of Steps: 4 Entrance Stairs-Rails: Right;Left Home Layout: Two level Alternate Level Stairs-Number of Steps: 14 Alternate Level Stairs-Rails: Right;Left Bathroom Shower/Tub: Tub/shower unit Home Adaptive Equipment: Straight cane Prior Function Level of Independence: Independent Able to Take Stairs?: Yes Driving: Yes Communication Communication: No difficulties    Cognition  Cognition Overall Cognitive Status: Appears within functional limits for tasks assessed/performed Arousal/Alertness: Awake/alert Orientation Level: Appears intact for tasks assessed Behavior During Session: [facility] for tasks performed    Extremity/Trunk Assessment Right Lower Extremity Assessment RLE ROM/Strength/Tone: Deficits RLE ROM/Strength/Tone Deficits: functionally  her muscular endurance is  poor as she fatigues with gait Left Lower Extremity Assessment LLE ROM/Strength/Tone: Deficits LLE ROM/Strength/Tone Deficits: functionally her muscular endurance is poor as her legs fatigue with gait.     Balance    End of Session PT - End of Session Equipment Utilized During Treatment: Gait belt Activity Tolerance: Patient limited by fatigue;Patient limited by pain Patient left: in chair;with call bell/phone within reach       [place] B. Jakai Onofre, PT, DPT [phone]   10/06/2012, 3:56 PM

## 2012-10-06 NOTE — Op Note (Signed)
[facility]  Patient name: [name] [name] MRN: [id number] DOB: 01-10-1932 Sex: female  10/02/2012 - 10/05/2012 Pre-operative Diagnosis: Thoracic aneurysm with likely type III endoleak Post-operative diagnosis:  Same Surgeon:  Jorge Ny Co-Surgeon:  Ofilia Neas Procedure:   #1.  Endovascular repair of thype III endoleak (baloon angioplasty of Thoracic aortic stent graft   #2.  Ultrasound guided access of right femoral artery   #3.  Thoracic arotic angiogram   #4.  Catheter in thoracic aorta x 1 Anesthesia:  General Blood Loss:  See anesthesia record Specimens:  none  Findings:  No endoleak seen  Indications:  The patinet is recently s/p TEVAR.  She developed abdominal and back pain on POD #2 and a CTA was obtained which revealed a likely type III endoleak.  It was felt that she should undergo repair.  This was discussed extensively with the patient and her daughter and they wished to proceed  Procedure:  The patient was identified in the holding area and taken to [facility] OR ROOM 16  The patient was then placed supine on the table. general anesthesia was administered.  The patient was prepped and draped in the usual sterile fashion.  A time out was called and antibiotics were administered.  Ultrasound was used to evaluate the left femoral artery which was minimally calcified and widely patent.  A #11 blade was used to make a skin nick.  Under ultrasound guidance, an 18 guage needle was used to canulate the left femoral artery.  An 035 wire was advanced into the aorta.  An 49F dilator was used to dilate the tract and 2 Proglide devices were placed in the 11 o'clock and 1 oc'lock position for preclosure.  An 49F sheath was placed.  The patient was fully heparanized.  A pigtailcatheter was advanced into the ascending thoracic aorta and an aortic angiogram was performed in multiple obliquities.  No gross endoleak was visualized.  Since there was a large leak seen on CT  scan, we felt that we should proceed with baloon molding of the 2 overlapping devices.  Over a lunderquist wire, a 149F sheath was inserted into the aorta without resistance.  A trilobe balloon was used to mold the device overlap areas.  A completion image was performed in multiple projections.  Again, no endoleak was visualized.  We elected to terminate the procedure at this point.  Over a benson wire, the proglide devices were used to close the arteriotomy.  A third proglide was required for complete hemostasis.  50 mg of protamine was given adn manual pressure was held for 10 minutes.  The skin incision was closed with a 4-0 vicryl and dermabond was applied.  The patient had palpable pedal pulses after the procedure.  There were no complications.    Disposition:  To PACU in stable condition.   Juleen China, M.D. [facility] [place] Office: [phone] Pager:  [phone]

## 2012-10-06 NOTE — Progress Notes (Signed)
Utilization review completed.  

## 2012-10-06 NOTE — Progress Notes (Signed)
                    [address]            [name] [name] [address]          [phone]     1 Day Post-Op Procedure(s) (LRB): Endovascular repair of THORACIC AORTIC ANEURYSM (N/A)  Subjective: OOB in chair.  Still complaining of back pain, but abdominal pain improved.   Objective: Vital signs in last 24 hours: Patient Vitals for the past 24 hrs:  BP Temp Temp src Pulse Resp SpO2 Height Weight  10/06/12 0705 124/73 mmHg - - 37 13 95 % - -  10/06/12 0340 115/55 mmHg 97.9 F (36.6 C) Oral 65 8 98 % - -  10/06/12 0325 - - - 73 8 99 % - -  10/06/12 0315 - - - 69 7 98 % - -  10/06/12 0305 - - - 106 7 98 % - -  10/06/12 0300 - - - 72 8 98 % - -  10/05/12 2335 102/59 mmHg 98 F (36.7 C) Axillary 75 11 100 % - -  10/05/12 2300 - - - 68 16 98 % - -  10/05/12 2200 - - - 77 7 99 % - -  10/05/12 2100 - - - 60 15 99 % - -  10/05/12 2000 - - - 74 8 97 % - -  10/05/12 1951 106/73 mmHg 98.4 F (36.9 C) Axillary 68 11 95 % - -  10/05/12 1900 129/65 mmHg - - 65 13 90 % - -  10/05/12 1800 - - - 70 12 99 % - -  10/05/12 1700 133/79 mmHg 97.9 F (36.6 C) Oral - - - 5\' 4"  (1.626 m) 139 lb (63.05 kg)  10/05/12 1645 - 97.4 F (36.3 C) - - - - - -  10/05/12 1615 144/72 mmHg - - - - - - -  10/05/12 1600 147/68 mmHg - - 73 28 96 % - -  10/05/12 1545 144/68 mmHg - - - - - - -  10/05/12 1536 126/78 mmHg 98 F (36.7 C) - - 16 - - -   Current Weight  10/05/12 139 lb (63.05 kg)     Intake/Output from previous day: 03/23 0701 - 03/24 0700 In: 1200 [I.V.:1200] Out: 1400 [Urine:1400]    PHYSICAL EXAM:  Heart: irr irr Lungs: Clear Wound: Groins stable, no hematoma Extremities: Palpable pulses Abdomen: soft, NT/ND, +BS    Lab Results: CBC: Recent Labs  10/05/12 1530 10/06/12 0430  WBC 11.6* 9.5  HGB 11.1* 10.7*  HCT 31.9* 30.6*  PLT 132* 136*   BMET:  Recent Labs  10/05/12 1530 10/06/12 0430  NA 136 136  K 3.8 3.8  CL 101 101  CO2 24 27  GLUCOSE 102* 107*   BUN 14 13  CREATININE 0.66 0.73  CALCIUM 8.9 8.8    PT/INR:  Recent Labs  10/06/12 0430  LABPROT 13.8  INR 1.07      Assessment/Plan: S/P Procedure(s) (LRB): Endovascular repair of THORACIC AORTIC ANEURYSM (N/A) CV- chronic AF, rate controlled. Continue current meds, resume Coumadin as directed. Advance diet, mobilize, routine POD 1 progression.   LOS: 4 days    [name] [name] H 10/06/2012

## 2012-10-06 NOTE — Progress Notes (Signed)
[facility] [place]  Subjective  -  The patient went back to the operating room yesterday for repair of a supposed typeIII endoleak.  She is doing well this am.  She still has bilateral rib pain.  Her chest pain is better.   Physical Exam:  Both groins are soft with palpable pedal pulses.   Her abdomen is soft       Assessment/Plan:  POD #1 and 3  Transfer to 2000 Restart lovenox/coumadin Saline lock IVF Encourage ambulation and po intake Likely home tomorrow  BRABHAM IV, V. WELLS 10/06/2012 8:16 PM --  Filed Vitals:   10/06/12 1949  BP: 94/62  Pulse: 81  Temp: 97.7 F (36.5 C)  Resp: 16    Intake/Output Summary (Last 24 hours) at 10/06/12 2016 Last data filed at 10/06/12 1600  Gross per 24 hour  Intake    840 ml  Output   2225 ml  Net  -1385 ml     Laboratory CBC    Component Value Date/Time   WBC 9.5 10/06/2012 0430   HGB 10.7* 10/06/2012 0430   HCT 30.6* 10/06/2012 0430   PLT 136* 10/06/2012 0430    BMET    Component Value Date/Time   NA 136 10/06/2012 0430   K 3.8 10/06/2012 0430   CL 101 10/06/2012 0430   CO2 27 10/06/2012 0430   GLUCOSE 107* 10/06/2012 0430   BUN 13 10/06/2012 0430   CREATININE 0.73 10/06/2012 0430   CREATININE 0.97 10/05/2010 0902   CALCIUM 8.8 10/06/2012 0430   GFRNONAA 80* 10/06/2012 0430   GFRAA >90 10/06/2012 0430    COAG Lab Results  Component Value Date   INR 1.07 10/06/2012   INR 1.28 10/05/2012   INR 1.21 10/05/2012   No results found for this basename: PTT    Antibiotics Anti-infectives   Start     Dose/Rate Route Frequency Ordered Stop   10/06/12 0000  cefUROXime (ZINACEF) 1.5 g in dextrose 5 % 50 mL IVPB     1.5 g 100 mL/hr over 30 Minutes Intravenous Every 12 hours 10/05/12 1516 10/06/12 1302   10/05/12 1312  cefUROXime (ZINACEF) 1.5 G injection    Comments:  FRIEDMAN, SCOTT: cabinet override      10/05/12 1312 10/05/12 1330   10/02/12 1800  cefUROXime (ZINACEF) 1.5 g in dextrose 5 %  50 mL IVPB     1.5 g 100 mL/hr over 30 Minutes Intravenous Every 12 hours 10/02/12 1006 10/03/12 0630   10/01/12 1428  cefUROXime (ZINACEF) 1.5 g in dextrose 5 % 50 mL IVPB     1.5 g 100 mL/hr over 30 Minutes Intravenous 30 min pre-op 10/01/12 1428 10/02/12 0741       V. Charlena Cross, M.D. [facility] [place] Office: [phone] Pager:  [phone]

## 2012-10-06 NOTE — Progress Notes (Signed)
Rehab Admissions Coordinator Note:  Patient was screened by Meryl Dare for appropriateness for an Inpatient Acute Rehab Consult.  At this time, we are recommending Inpatient Rehab consult.  Meryl Dare 10/06/2012, 4:16 PM  I can be reached at [phone].

## 2012-10-07 ENCOUNTER — Encounter (HOSPITAL_COMMUNITY): Payer: Self-pay | Admitting: Surgery

## 2012-10-07 DIAGNOSIS — I079 Rheumatic tricuspid valve disease, unspecified: Secondary | ICD-10-CM | POA: Diagnosis not present

## 2012-10-07 DIAGNOSIS — I359 Nonrheumatic aortic valve disorder, unspecified: Secondary | ICD-10-CM | POA: Diagnosis not present

## 2012-10-07 DIAGNOSIS — I061 Rheumatic aortic insufficiency: Secondary | ICD-10-CM | POA: Diagnosis not present

## 2012-10-07 DIAGNOSIS — I509 Heart failure, unspecified: Secondary | ICD-10-CM | POA: Diagnosis not present

## 2012-10-07 DIAGNOSIS — I10 Essential (primary) hypertension: Secondary | ICD-10-CM | POA: Diagnosis not present

## 2012-10-07 DIAGNOSIS — I4891 Unspecified atrial fibrillation: Secondary | ICD-10-CM | POA: Diagnosis not present

## 2012-10-07 DIAGNOSIS — I712 Thoracic aortic aneurysm, without rupture: Secondary | ICD-10-CM | POA: Diagnosis not present

## 2012-10-07 DIAGNOSIS — Z5189 Encounter for other specified aftercare: Secondary | ICD-10-CM | POA: Diagnosis not present

## 2012-10-07 DIAGNOSIS — R5381 Other malaise: Secondary | ICD-10-CM | POA: Diagnosis not present

## 2012-10-07 LAB — PROTIME-INR: Prothrombin Time: 14.2 seconds (ref 11.6–15.2)

## 2012-10-07 LAB — CBC
HCT: 32.3 % — ABNORMAL LOW (ref 36.0–46.0)
Hemoglobin: 10.9 g/dL — ABNORMAL LOW (ref 12.0–15.0)
MCH: 31.5 pg (ref 26.0–34.0)
MCHC: 33.7 g/dL (ref 30.0–36.0)

## 2012-10-07 MED ORDER — PANTOPRAZOLE SODIUM 40 MG PO TBEC: 40.0000 mg | DELAYED_RELEASE_TABLET | Freq: Every day | ORAL | Status: DC

## 2012-10-07 MED ORDER — OXYCODONE HCL 5 MG PO TABS: 5.0000 mg | ORAL_TABLET | ORAL | Status: DC | PRN

## 2012-10-07 NOTE — Clinical Social Work Psychosocial (Signed)
     Clinical Social Work Department BRIEF PSYCHOSOCIAL ASSESSMENT 10/07/2012  Patient:  [name] [name], [name] [name]     Account Number:  [id number]     Admit date:  10/02/2012  Clinical Social Worker:  Lourdes Sledge  Date/Time:  10/07/2012 09:37 AM  Referred by:  RN  Date Referred:  10/07/2012 Referred for  SNF Placement   Other Referral:   Interview type:  Patient Other interview type:   CSW also completed assessment with pt daughter Temprance Wyre    PSYCHOSOCIAL DATA Living Status:  ALONE Admitted from facility:   Level of care:   Primary support name:  Ivin Poot Primary support relationship to patient:  CHILD, ADULT Degree of support available:   Pt daughter present in room and actively involved in care.    CURRENT CONCERNS Current Concerns  Post-Acute Placement   Other Concerns:    SOCIAL WORK ASSESSMENT / PLAN CSW received a call from RN that pt was initially returning home however now needing ST SNF.    CSW visited pt room and explored pt living situation. Pt states she lives alone in a 2-story town home. Pt states her room and bathroom are upstairs (14 steps to her bedroom.) Pt daughter Orlie Pollen confirmed the above and expressed concern with pt returning home. CSW informed pt of CSW ability to do a SNF search in [place]. Pt stated she would be agreeable and preferred placement at [facility]. CSW encouraged pt to consider another facility as well in the instance that [facility] does not have any beds available, pt agreeable.    CSW to submit for a pasrr and do a SNF search.   Assessment/plan status:  Psychosocial Support/Ongoing Assessment of Needs Other assessment/ plan:   Information/referral to community resources:   CSW provided pt and daughter a SNF list and CSW contact information.    PATIENTS/FAMILYS RESPONSE TO PLAN OF CARE: Pt sitting on the chair alert and oriented. Pt very pleasant to speak and appreciative of CSW visit. Daughter supportive and  agreeable to SNF placement.    CSW to remain following.

## 2012-10-07 NOTE — Progress Notes (Signed)
Called report to Sam,RN at [facility]. Also discussed discharge instructions and medications with patient and patients daughter. Both verbalized understanding with all questions answered. VSS. Pt discharged to [facility] via daughter.   [facility]

## 2012-10-07 NOTE — Progress Notes (Signed)
Clinical Child psychotherapist (CSW) prepared pt [employer] and gave it to pt daughter who will provide transportation for pt-by car. Pt remains agreeable and appreciative of CSW intervention. RN informed to call the facility for report. No additional CSW needs or concerns. CSW is signing off.  Theresia Bough, MSW, [facility] [phone]

## 2012-10-07 NOTE — Discharge Summary (Signed)
[facility] Discharge Summary  [name] [name] 1931/07/19 79 y.o. female  [id number]  Admission Date: 10/02/2012  Discharge Date: 10/07/12  Physician: Nada Libman, MD  Admission Diagnosis: ANEURYSM .Marland KitchenMarland Kitchen   HPI:   This is a 79 y.o. female that I am seeing for evaluation of a descending thoracic aortic aneurysm. This was incidentally discovered during a workup with a CT scan. She denies any chest or back pain. She has a history of hypertension, however she states this has been well controlled. Her average blood pressure is in the 118/70 range. She suffers from hypercholesterolemia but stop her statins secondary to muscle weakness. Her biggest medical problem has been atrial fibrillation. She has undergone ablation in the past, however it returned. She is now managed with digoxin and Coumadin. She has a history of smoking but quit approximately 25 years ago.  Hospital Course:  The patient was admitted to the hospital and taken to the operating room on 10/02/2012 - 10/05/2012 and underwent  Endovascular repair of THORACIC AORTIC ANEURYSM (N/A) - Angioplasty of Aortic throasic aneurysm, x1 aortagram, Percutaneous acess right femoral artery.  Aortograms with multiple views before and after Balloon Dilations of Overlap portion of Stent Grafts  US guided percutaneous access of rt femoral artery, with perclosure device.    On POD 2, she developed abdominal pain and back pain.  A CTA was obtained, which revealed a likely type III endoleak.  It was felt that she should undergo repair.  This was discussed extensively with the pt and her daughter and they wished to proceed.  On 10/05/12, she was taken back to the OR and underwent: #1. Endovascular repair of thype III endoleak (baloon angioplasty of Thoracic aortic stent graft  #2. Ultrasound guided access of right femoral artery  #3. Thoracic arotic angiogram  #4. Catheter in thoracic aorta x 1  The pt tolerated the procedure  well and was transported to the PACU in good condition. She has continued to do well.    She is being bridged with lovenox/Coumadin for her Afib.   She is discharged to a SNF on 10/07/12.  The remainder of the hospital course consisted of increasing mobilization and increasing intake of solids without difficulty.  CBC    Component Value Date/Time   WBC 8.6 10/07/2012 0508   RBC 3.46* 10/07/2012 0508   HGB 10.9* 10/07/2012 0508   HCT 32.3* 10/07/2012 0508   PLT 150 10/07/2012 0508   MCV 93.4 10/07/2012 0508   MCH 31.5 10/07/2012 0508   MCHC 33.7 10/07/2012 0508   RDW 13.2 10/07/2012 0508    BMET    Component Value Date/Time   NA 136 10/06/2012 0430   K 3.8 10/06/2012 0430   CL 101 10/06/2012 0430   CO2 27 10/06/2012 0430   GLUCOSE 107* 10/06/2012 0430   BUN 13 10/06/2012 0430   CREATININE 0.73 10/06/2012 0430   CREATININE 0.97 10/05/2010 0902   CALCIUM 8.8 10/06/2012 0430   GFRNONAA 80* 10/06/2012 0430   GFRAA >90 10/06/2012 0430     Discharge Instructions:   The patient is discharged to home with extensive instructions on wound care and progressive ambulation.  They are instructed not to drive or perform any heavy lifting until returning to see the physician in his office.  Discharge Orders   Future Appointments Provider Department Dept Phone   11/24/2012 11:15 AM Vesta Mixer, MD [facility] Main Office [place]) [phone]   Future Orders Complete By Expires  Call MD for:  redness, tenderness, or signs of infection (pain, swelling, bleeding, redness, odor or green/yellow discharge around incision site)  As directed     Call MD for:  severe or increased pain, loss or decreased feeling  in affected limb(s)  As directed     Call MD for:  temperature >100.5  As directed     Discharge instructions  As directed     Comments:      Continue Lovenox until INR is therapeutic between 2.0-2.5.  Check INR on Thursday 10/09/12. At discharge, f/u with MD who follows coumadin/INR.     Discharge wound care:  As directed     Comments:      Shower daily with soap and water starting 10/07/12    Driving Restrictions  As directed     Comments:      No driving for 2 weeks    Lifting restrictions  As directed     Comments:      No lifting for 4-6 weeks    Resume previous diet  As directed        Discharge Diagnosis:  ANEURYSM .Marland KitchenMarland Kitchen  Secondary Diagnosis: Patient Active Problem List  Diagnosis  . Atrial fibrillation  . Hypertension  . Aortic insufficiency  . Mitral regurgitation  . Tricuspid regurgitation  . Hyperlipidemia  . Pulmonary hypertension  . Diastolic CHF  . Dyspnea  . Descending thoracic aortic aneurysm  . Abdominal aneurysm without mention of rupture   Past Medical History  Diagnosis Date  . AF (atrial fibrillation)   . Hypertension   . Aortic insufficiency   . Mitral regurgitation   . Tricuspid regurgitation   . CHF (congestive heart failure)   . Aortic aneurysm   . CAD (coronary artery disease)   . Arthritis   . Claustrophobia   . Shortness of breath   . Broken neck     40 years ago      Medication List    TAKE these medications       acetaminophen 500 MG tablet  Commonly known as:  TYLENOL  Take 500 mg by mouth every 4 (four) hours as needed for pain.     digoxin 0.125 MG tablet  Commonly known as:  LANOXIN  Take 125 mcg by mouth at bedtime.     enoxaparin 60 MG/0.6ML injection  Commonly known as:  LOVENOX  Inject 0.6 mLs (60 mg total) into the skin every 12 (twelve) hours.     furosemide 20 MG tablet  Commonly known as:  LASIX  Take 1 tablet (20 mg total) by mouth daily.     oxyCODONE 5 MG immediate release tablet  Commonly known as:  Oxy IR/ROXICODONE  Take 1-2 tablets (5-10 mg total) by mouth every 4 (four) hours as needed.     pantoprazole 40 MG tablet  Commonly known as:  PROTONIX  Take 1 tablet (40 mg total) by mouth daily.     prazosin 2 MG capsule  Commonly known as:  MINIPRESS  Take 1 capsule (2 mg total)  by mouth 2 (two) times daily.     propranolol ER 160 MG SR capsule  Commonly known as:  INDERAL LA  Take 160 mg by mouth at bedtime.     propranolol 10 MG tablet  Commonly known as:  INDERAL  Take 10 mg by mouth daily.     spironolactone 12.5 mg Tabs  Commonly known as:  ALDACTONE  Take 12.5 mg by mouth every morning.  warfarin 5 MG tablet  Commonly known as:  COUMADIN  Take 2.5-5 mg by mouth daily. 5mg  Monday, Wednesday, Friday, 2.5 mg all other days.        Disposition: SNF  Patient's condition: is Good  Follow up: 1. Dr. Myra Gianotti in 4 weeks with CTA 2. Dr. Lowella Fairy in 4 weeks   Doreatha Massed, PA-C [facility] [phone] 10/07/2012  1:44 PM

## 2012-10-07 NOTE — Progress Notes (Signed)
Clinical Child psychotherapist (CSW) met with pt and daughter to provide bed offers. Pt has chosen [facility] for placement, CSW has notified the facility. CSW also informed MD. CSW will assist with dc today.  Theresia Bough, MSW, [facility] [phone]

## 2012-10-07 NOTE — Discharge Summary (Signed)
I agree with the above.  She is doing well, but is uncomfortable going home because she has 14 steps to climb to get to her bedroom.  She wants to [facility] rehab for a short time   [facility]

## 2012-10-07 NOTE — Progress Notes (Signed)
                    [address]            [place] [address]          [phone]     2 Days Post-Op Procedure(s) (LRB): Endovascular repair of THORACIC AORTIC ANEURYSM (N/A)  Subjective: Still having discomfort in middle of her back and lateral flanks.  Appetite poor.  No nausea or vomiting.  No BM, but she is passing flatus.   Objective: Vital signs in last 24 hours: Patient Vitals for the past 24 hrs:  BP Temp Temp src Pulse Resp SpO2  10/07/12 0734 - 98.2 F (36.8 C) Oral - - -  10/07/12 0335 121/80 mmHg 98.1 F (36.7 C) - 95 15 91 %  10/06/12 2310 104/54 mmHg 98.7 F (37.1 C) Oral 89 19 95 %  10/06/12 2141 131/77 mmHg - - - - -  10/06/12 1949 94/62 mmHg 97.7 F (36.5 C) Oral 81 16 92 %  10/06/12 1600 113/57 mmHg 97.9 F (36.6 C) Oral - 17 95 %  10/06/12 1127 - 97.6 F (36.4 C) Oral - - -  10/06/12 1100 120/72 mmHg - - - 20 96 %  10/06/12 1032 100/63 mmHg - - 80 - -   Current Weight  10/05/12 139 lb (63.05 kg)     Intake/Output from previous day: 03/24 0701 - 03/25 0700 In: 1480 [P.O.:1080; I.V.:400] Out: 2025 [Urine:2025]    PHYSICAL EXAM:  Heart: Irr irr Lungs: Clear Wound: Bilateral groins stable Extremities: Warm, well perfused Abdomen: Soft, NT/ND, +BS    Lab Results: CBC: Recent Labs  10/06/12 0430 10/07/12 0508  WBC 9.5 8.6  HGB 10.7* 10.9*  HCT 30.6* 32.3*  PLT 136* 150   BMET:  Recent Labs  10/05/12 1530 10/06/12 0430  NA 136 136  K 3.8 3.8  CL 101 101  CO2 24 27  GLUCOSE 102* 107*  BUN 14 13  CREATININE 0.66 0.73  CALCIUM 8.9 8.8    PT/INR:  Recent Labs  10/07/12 0508  LABPROT 14.2  INR 1.11      Assessment/Plan: S/P Procedure(s) (LRB): Endovascular repair of THORACIC AORTIC ANEURYSM (N/A) Continue current care. Disp- PT recommends CIR vs SNF at discharge.  Plans per [facility].   LOS: 5 days    Graison Leinberger H 10/07/2012

## 2012-10-07 NOTE — Progress Notes (Signed)
[facility] Progress Note  10/07/2012 7:49 AM POD 4/2  Subjective:  Not feeling well this am.  States that she has pain around her upper quadrants of her belly and around her ribs bilaterally.  Not eating due to no appetite.  C/o acid reflux.  Afebrile  VSS  Filed Vitals:   10/07/12 0734  BP:   Pulse:   Temp: 98.2 F (36.8 C)  Resp:     Physical Exam: Incisions:  bialteral groins are soft Extremities:  Bilateral feet are warm Abdomen:  Soft NT/ND +BS; denies nausea/vomiting  Lungs:  CTAB Cardiac:  irregular  CBC    Component Value Date/Time   WBC 8.6 10/07/2012 0508   RBC 3.46* 10/07/2012 0508   HGB 10.9* 10/07/2012 0508   HCT 32.3* 10/07/2012 0508   PLT 150 10/07/2012 0508   MCV 93.4 10/07/2012 0508   MCH 31.5 10/07/2012 0508   MCHC 33.7 10/07/2012 0508   RDW 13.2 10/07/2012 0508    BMET    Component Value Date/Time   NA 136 10/06/2012 0430   K 3.8 10/06/2012 0430   CL 101 10/06/2012 0430   CO2 27 10/06/2012 0430   GLUCOSE 107* 10/06/2012 0430   BUN 13 10/06/2012 0430   CREATININE 0.73 10/06/2012 0430   CREATININE 0.97 10/05/2010 0902   CALCIUM 8.8 10/06/2012 0430   GFRNONAA 80* 10/06/2012 0430   GFRAA >90 10/06/2012 0430    INR    Component Value Date/Time   INR 1.11 10/07/2012 0508   INR 3.0 09/26/2012 1101     Intake/Output Summary (Last 24 hours) at 10/07/12 0749 Last data filed at 10/07/12 0500  Gross per 24 hour  Intake   1240 ml  Output   1900 ml  Net   -660 ml     Assessment/Plan:  79 y.o. female is s/p:   #1. Endovascular repair of thype III endoleak (baloon angioplasty of Thoracic aortic stent graft  #2. Ultrasound guided access of right femoral artery  #3. Thoracic arotic angiogram  #4. Catheter in thoracic aorta x 1  POD 4/2  -pt c/o of abdominal pain/pressure/acid reflux-she is on PPI -continue lovenox/coumadin -surgical anemia is stable -encourage po intake -CIR is recommended   Doreatha Massed, PA-C [facility] [phone] 10/07/2012 7:49 AM

## 2012-10-07 NOTE — Clinical Social Work Placement (Addendum)
    Clinical Social Work Department CLINICAL SOCIAL WORK PLACEMENT NOTE 10/07/2012  Patient:  [name] [name], [name] [name]  Account Number:  [id number] Admit date:  10/02/2012  Clinical Social Worker:  Theresia Bough, Theresia Majors  Date/time:  10/07/2012 10:14 AM  Clinical Social Work is seeking post-discharge placement for this patient at the following level of care:   SKILLED NURSING   (*CSW will update this form in Epic as items are completed)   10/07/2012  Patient/family provided with [facility] Department of Clinical Social Work's list of facilities offering this level of care within the geographic area requested by the patient (or if unable, by the patient's family).  10/07/2012  Patient/family informed of their freedom to choose among providers that offer the needed level of care, that participate in Medicare, Medicaid or managed care program needed by the patient, have an available bed and are willing to accept the patient.  10/07/2012  Patient/family informed of [facility]' ownership interest in [facility], as well as of the fact that they are under no obligation to receive care at this facility.  PASARR submitted to EDS on 10/07/2012 PASARR number received from EDS on 10/07/2012  FL2 transmitted to all facilities in geographic area requested by pt/family on  10/07/2012 FL2 transmitted to all facilities within larger geographic area on   Patient informed that his/her managed care company has contracts with or will negotiate with  certain facilities, including the following:     Patient/family informed of bed offers received:  10/07/12 [facility] at [facility] Physician recommends and patient chooses bed at    [facility] to [facility] to [facility] on  10/07/12 Patient to be transferred to facility by [facility] TO PROVIDE TRANSPORTATION.  The following physician request were entered in Epic:   Additional Comments:

## 2012-10-08 DIAGNOSIS — I10 Essential (primary) hypertension: Secondary | ICD-10-CM | POA: Diagnosis not present

## 2012-10-08 DIAGNOSIS — I4891 Unspecified atrial fibrillation: Secondary | ICD-10-CM | POA: Diagnosis not present

## 2012-10-08 DIAGNOSIS — R5381 Other malaise: Secondary | ICD-10-CM | POA: Diagnosis not present

## 2012-10-10 ENCOUNTER — Other Ambulatory Visit: Payer: Self-pay | Admitting: *Deleted

## 2012-10-10 DIAGNOSIS — I712 Thoracic aortic aneurysm, without rupture: Secondary | ICD-10-CM

## 2012-10-10 DIAGNOSIS — I714 Abdominal aortic aneurysm, without rupture: Secondary | ICD-10-CM

## 2012-10-10 NOTE — Transfer of Care (Signed)
Immediate Anesthesia Transfer of Care Note  Patient: [name] [name]  Procedure(s) Performed: Procedure(s) with comments: Endovascular repair of THORACIC AORTIC ANEURYSM (N/A) - Angioplasty of Aortic throasic aneurysm, x1 aortagram,  Percutaneous acess right femoral artery.  Patient Location: PACU  Anesthesia Type:General  Level of Consciousness: sedated  Airway & Oxygen Therapy: Patient Spontanous Breathing and Patient connected to nasal cannula oxygen  Post-op Assessment: {ASSESSMENT stable  Post vital signs: Reviewed and stable  Complications: No apparent anesthesia complications

## 2012-10-21 ENCOUNTER — Telehealth: Payer: Self-pay | Admitting: Cardiovascular Disease

## 2012-10-21 NOTE — Telephone Encounter (Signed)
New Problem:    Patient called in wanting to speak with you about information she needed to give you.  Please call back.

## 2012-10-21 NOTE — Telephone Encounter (Signed)
Pt calls to inform us that she will be discharged tomorrow from [facility] facility. She will get her last 7 day dose info and INR readings and med changes from staff so we can schedule her next CVRR appt.

## 2012-10-23 ENCOUNTER — Ambulatory Visit (INDEPENDENT_AMBULATORY_CARE_PROVIDER_SITE_OTHER): Payer: Medicare Other | Admitting: Cardiovascular Disease

## 2012-10-23 DIAGNOSIS — I5033 Acute on chronic diastolic (congestive) heart failure: Secondary | ICD-10-CM | POA: Diagnosis not present

## 2012-10-23 DIAGNOSIS — Z48812 Encounter for surgical aftercare following surgery on the circulatory system: Secondary | ICD-10-CM | POA: Diagnosis not present

## 2012-10-23 DIAGNOSIS — Z7901 Long term (current) use of anticoagulants: Secondary | ICD-10-CM | POA: Insufficient documentation

## 2012-10-23 DIAGNOSIS — Z5181 Encounter for therapeutic drug level monitoring: Secondary | ICD-10-CM | POA: Diagnosis not present

## 2012-10-23 DIAGNOSIS — M6281 Muscle weakness (generalized): Secondary | ICD-10-CM | POA: Diagnosis not present

## 2012-10-23 DIAGNOSIS — I4891 Unspecified atrial fibrillation: Secondary | ICD-10-CM

## 2012-10-23 LAB — POCT INR: INR: 4.8

## 2012-10-28 DIAGNOSIS — M6281 Muscle weakness (generalized): Secondary | ICD-10-CM | POA: Diagnosis not present

## 2012-10-28 DIAGNOSIS — I5033 Acute on chronic diastolic (congestive) heart failure: Secondary | ICD-10-CM | POA: Diagnosis not present

## 2012-10-28 DIAGNOSIS — Z48812 Encounter for surgical aftercare following surgery on the circulatory system: Secondary | ICD-10-CM | POA: Diagnosis not present

## 2012-10-28 DIAGNOSIS — Z7901 Long term (current) use of anticoagulants: Secondary | ICD-10-CM | POA: Diagnosis not present

## 2012-10-28 DIAGNOSIS — Z5181 Encounter for therapeutic drug level monitoring: Secondary | ICD-10-CM | POA: Diagnosis not present

## 2012-10-30 ENCOUNTER — Ambulatory Visit (INDEPENDENT_AMBULATORY_CARE_PROVIDER_SITE_OTHER): Payer: Medicare Other | Admitting: Cardiology

## 2012-10-30 DIAGNOSIS — Z7901 Long term (current) use of anticoagulants: Secondary | ICD-10-CM | POA: Diagnosis not present

## 2012-10-30 DIAGNOSIS — I4891 Unspecified atrial fibrillation: Secondary | ICD-10-CM

## 2012-10-30 DIAGNOSIS — I5033 Acute on chronic diastolic (congestive) heart failure: Secondary | ICD-10-CM | POA: Diagnosis not present

## 2012-10-30 DIAGNOSIS — M6281 Muscle weakness (generalized): Secondary | ICD-10-CM | POA: Diagnosis not present

## 2012-10-30 DIAGNOSIS — Z5181 Encounter for therapeutic drug level monitoring: Secondary | ICD-10-CM | POA: Diagnosis not present

## 2012-10-30 DIAGNOSIS — Z48812 Encounter for surgical aftercare following surgery on the circulatory system: Secondary | ICD-10-CM | POA: Diagnosis not present

## 2012-10-30 LAB — POCT INR: INR: 3

## 2012-11-04 DIAGNOSIS — I5033 Acute on chronic diastolic (congestive) heart failure: Secondary | ICD-10-CM

## 2012-11-05 DIAGNOSIS — Z5181 Encounter for therapeutic drug level monitoring: Secondary | ICD-10-CM | POA: Diagnosis not present

## 2012-11-05 DIAGNOSIS — I5033 Acute on chronic diastolic (congestive) heart failure: Secondary | ICD-10-CM | POA: Diagnosis not present

## 2012-11-05 DIAGNOSIS — Z48812 Encounter for surgical aftercare following surgery on the circulatory system: Secondary | ICD-10-CM | POA: Diagnosis not present

## 2012-11-05 DIAGNOSIS — Z7901 Long term (current) use of anticoagulants: Secondary | ICD-10-CM | POA: Diagnosis not present

## 2012-11-05 DIAGNOSIS — M6281 Muscle weakness (generalized): Secondary | ICD-10-CM | POA: Diagnosis not present

## 2012-11-06 DIAGNOSIS — Z48812 Encounter for surgical aftercare following surgery on the circulatory system: Secondary | ICD-10-CM | POA: Diagnosis not present

## 2012-11-06 DIAGNOSIS — M6281 Muscle weakness (generalized): Secondary | ICD-10-CM | POA: Diagnosis not present

## 2012-11-06 DIAGNOSIS — Z7901 Long term (current) use of anticoagulants: Secondary | ICD-10-CM | POA: Diagnosis not present

## 2012-11-06 DIAGNOSIS — I5033 Acute on chronic diastolic (congestive) heart failure: Secondary | ICD-10-CM | POA: Diagnosis not present

## 2012-11-06 DIAGNOSIS — Z5181 Encounter for therapeutic drug level monitoring: Secondary | ICD-10-CM | POA: Diagnosis not present

## 2012-11-07 DIAGNOSIS — M6281 Muscle weakness (generalized): Secondary | ICD-10-CM | POA: Diagnosis not present

## 2012-11-07 DIAGNOSIS — I5033 Acute on chronic diastolic (congestive) heart failure: Secondary | ICD-10-CM | POA: Diagnosis not present

## 2012-11-07 DIAGNOSIS — Z48812 Encounter for surgical aftercare following surgery on the circulatory system: Secondary | ICD-10-CM | POA: Diagnosis not present

## 2012-11-07 DIAGNOSIS — Z7901 Long term (current) use of anticoagulants: Secondary | ICD-10-CM | POA: Diagnosis not present

## 2012-11-07 DIAGNOSIS — Z5181 Encounter for therapeutic drug level monitoring: Secondary | ICD-10-CM | POA: Diagnosis not present

## 2012-11-11 ENCOUNTER — Other Ambulatory Visit: Payer: Self-pay | Admitting: *Deleted

## 2012-11-11 ENCOUNTER — Ambulatory Visit (INDEPENDENT_AMBULATORY_CARE_PROVIDER_SITE_OTHER): Payer: Medicare Other | Admitting: Cardiovascular Disease

## 2012-11-11 DIAGNOSIS — I712 Thoracic aortic aneurysm, without rupture: Secondary | ICD-10-CM

## 2012-11-11 DIAGNOSIS — R6889 Other general symptoms and signs: Secondary | ICD-10-CM | POA: Diagnosis not present

## 2012-11-11 DIAGNOSIS — Z7901 Long term (current) use of anticoagulants: Secondary | ICD-10-CM

## 2012-11-11 DIAGNOSIS — I4891 Unspecified atrial fibrillation: Secondary | ICD-10-CM

## 2012-11-11 DIAGNOSIS — Z48812 Encounter for surgical aftercare following surgery on the circulatory system: Secondary | ICD-10-CM | POA: Diagnosis not present

## 2012-11-11 DIAGNOSIS — I5033 Acute on chronic diastolic (congestive) heart failure: Secondary | ICD-10-CM | POA: Diagnosis not present

## 2012-11-11 DIAGNOSIS — M6281 Muscle weakness (generalized): Secondary | ICD-10-CM | POA: Diagnosis not present

## 2012-11-11 DIAGNOSIS — Z5181 Encounter for therapeutic drug level monitoring: Secondary | ICD-10-CM | POA: Diagnosis not present

## 2012-11-11 LAB — PROTIME-INR: INR: 3.9 — AB (ref <=1.1)

## 2012-11-13 ENCOUNTER — Ambulatory Visit (INDEPENDENT_AMBULATORY_CARE_PROVIDER_SITE_OTHER): Payer: Self-pay | Admitting: Cardiothoracic Surgery

## 2012-11-13 ENCOUNTER — Ambulatory Visit
Admission: RE | Admit: 2012-11-13 | Discharge: 2012-11-13 | Disposition: A | Discharge status: Home / Self Care | Payer: Medicare Other | Source: Ambulatory Visit | Attending: Cardiothoracic Surgery | Admitting: Cardiothoracic Surgery

## 2012-11-13 ENCOUNTER — Encounter: Payer: Self-pay | Admitting: Cardiothoracic Surgery

## 2012-11-13 VITALS — BP 98/63 | HR 66 | Resp 16 | Ht 64.0 in | Wt 123.0 lb

## 2012-11-13 DIAGNOSIS — Z09 Encounter for follow-up examination after completed treatment for conditions other than malignant neoplasm: Secondary | ICD-10-CM

## 2012-11-13 DIAGNOSIS — I712 Thoracic aortic aneurysm, without rupture, unspecified: Secondary | ICD-10-CM

## 2012-11-13 DIAGNOSIS — R0602 Shortness of breath: Secondary | ICD-10-CM | POA: Diagnosis not present

## 2012-11-13 NOTE — Progress Notes (Signed)
[address]            [place] [address]          [phone]       [name] [name] [facility] Medical Record #[id number] Date of Birth: Sep 18, 1931  Nada Libman, MD [name], MD  Chief Complaint:   PostOp Follow Up Visit #1: Endovascular repair of a descending thoracic aortic aneurysm  #2: Distal extension x1  #3: Thoracic aortic angiogram  #4: Abdominal aortic angiogram  #5: Catheter in aorta x2  #6: Ultrasound-guided access, left femoral artery  Anesthesia: Gen.  Blood Loss: See anesthesia record  Specimens: None  Findings: Complete exclusion of the aneurysm with preserved patency of the celiac axis  Devices used: Proximal piece is a Gore CTAG 37 x 15. Distal extension is a Gore CTAG 40 x 15    3 days following the procedure she had taken back to the OR with question of endoleak3 this was not confirmed on aortogram with the graft was re\re ballooned   History of Present Illness:      Since discharge home the patient has been doing relatively well. She is back on Coumadin for chronic atrial fibrillation which has been difficult to control. She notes it INR was 5.2 a couple days it's being repeated tomorrow. Her major complaint at this point is dermatomal pain radiating around both sides in the midthoracic area. She thought she had shingles. The discomfort has been present since she was in the hospital and seems to be improving but still fluctuates. She denies any neurologic symptoms in the lower extremities.    History  Smoking status  . Former Smoker -- 1.00 packs/day for 20 years  . Types: Cigarettes  . Quit date: 08/22/1985  Smokeless tobacco  . Never Used       Allergies  Allergen Reactions  . Atorvastatin     Muscle aches  . Amiodarone Hcl     REACTION: Nausea/Vomiting; decrease appetite; affects liver enzymes  . Aspirin   . Diltiazem Hcl     REACTION: Nausea/vomitting  . Flecainide Acetate    REACTION: Intol: nausea/horrible feeling  . Metoprolol Succinate     REACTION: Nausea/vomiting;dizziness  . Sotalol Hcl     REACTION: Nausea/vomiting    Current Outpatient Prescriptions  Medication Sig Dispense Refill  . acetaminophen (TYLENOL) 500 MG tablet Take 500 mg by mouth every 4 (four) hours as needed for pain.       Marland Kitchen digoxin (LANOXIN) 0.125 MG tablet Take 125 mcg by mouth at bedtime.      . furosemide (LASIX) 20 MG tablet Take 1 tablet (20 mg total) by mouth daily.  90 tablet  3  . prazosin (MINIPRESS) 2 MG capsule Take 1 capsule (2 mg total) by mouth 2 (two) times daily.  180 capsule  3  . propranolol (INDERAL) 10 MG tablet Take 10 mg by mouth daily.      . propranolol ER (INDERAL LA) 160 MG SR capsule Take 160 mg by mouth at bedtime.      Marland Kitchen spironolactone (ALDACTONE) 12.5 mg TABS Take 12.5 mg by mouth every morning.      . warfarin (COUMADIN) 5 MG tablet Take 2.5-5 mg by mouth daily. 5mg  Monday, Wednesday, Friday, 2.5 mg all other days.       No current facility-administered medications for this visit.  Physical Exam: BP 98/63  Pulse 66  Resp 16  Ht 5\' 4"  (1.626 m)  Wt 123 lb (55.792 kg)  BMI 21.1 kg/m2  SpO2 98%  General appearance: alert and cooperative Neurologic: intact Heart: irregularly irregular rhythm Lungs: clear to auscultation bilaterally Abdomen: soft, non-tender; bowel sounds normal; no masses,  no organomegaly Extremities: extremities normal, atraumatic, no cyanosis or edema and Homans sign is negative, no sign of DVT Wound: She has palpable bilateral femoral pulses without evidence of false aneurysm on exam   Diagnostic Studies & Laboratory data:         Recent Radiology Findings: Dg Chest 2 View  11/13/2012  *RADIOLOGY REPORT*  Clinical Data: Endovascular thoracic aortic repair.  Shortness of breath.  CHEST - 2 VIEW  Comparison: 08/22/2012  Findings: Stent graft repair of the descending thoracic aortic aneurysm.  Large aneurysm sac again  noted within the mid descending thoracic aorta.  Cardiomegaly.  Lungs are clear.  No effusions.  No acute bony abnormality.  IMPRESSION: Post stent graft repair of the descending thoracic aortic aneurysm.  Cardiomegaly.   Original Report Authenticated By: Charlett Nose, M.D.       Recent Labs: Lab Results  Component Value Date   WBC 8.6 10/07/2012   HGB 10.9* 10/07/2012   HCT 32.3* 10/07/2012   PLT 150 10/07/2012   GLUCOSE 107* 10/06/2012   CHOL 204* 08/18/2012   TRIG 54.0 08/18/2012   HDL 54.80 08/18/2012   LDLDIRECT 127.6 08/18/2012   LDLCALC 100* 10/05/2010   ALT 19 09/24/2012   AST 25 09/24/2012   NA 136 10/06/2012   K 3.8 10/06/2012   CL 101 10/06/2012   CREATININE 0.73 10/06/2012   BUN 13 10/06/2012   CO2 27 10/06/2012   TSH 1.382 Test methodology is 3rd generation TSH 01/05/2009   INR 5.0 11/11/2012   INR 3.9* 11/11/2012      Assessment / Plan:      Patient status post thoracic aortic stent graft, with history of chronic atrial fibrillation on Coumadin Unexplained neuropathic rib pain bilaterally mid thoracic region/ question related to stent graft Patient is scheduled for a CTA May 5 Our review the scan when it's done at that time, she also has an appointment to see Dr. Myra Gianotti the same day. She has a followup appointment to see me in 6 months but I will be changed according to findings of the CT scan.      [name] [name] B 11/13/2012 3:34 PM

## 2012-11-14 ENCOUNTER — Encounter: Payer: Self-pay | Admitting: Surgery

## 2012-11-14 ENCOUNTER — Ambulatory Visit (INDEPENDENT_AMBULATORY_CARE_PROVIDER_SITE_OTHER): Payer: Medicare Other | Admitting: *Deleted

## 2012-11-14 DIAGNOSIS — I4891 Unspecified atrial fibrillation: Secondary | ICD-10-CM | POA: Diagnosis not present

## 2012-11-14 DIAGNOSIS — Z7901 Long term (current) use of anticoagulants: Secondary | ICD-10-CM | POA: Diagnosis not present

## 2012-11-14 LAB — POCT INR: INR: 3.9

## 2012-11-17 ENCOUNTER — Encounter: Payer: Self-pay | Admitting: Surgery

## 2012-11-17 ENCOUNTER — Ambulatory Visit (INDEPENDENT_AMBULATORY_CARE_PROVIDER_SITE_OTHER): Payer: Medicare Other | Admitting: Surgery

## 2012-11-17 ENCOUNTER — Ambulatory Visit
Admission: RE | Admit: 2012-11-17 | Discharge: 2012-11-17 | Disposition: A | Discharge status: Home / Self Care | Payer: Medicare Other | Source: Ambulatory Visit | Attending: Surgery | Admitting: Surgery

## 2012-11-17 VITALS — BP 141/75 | HR 65 | Resp 16 | Ht 63.5 in | Wt 122.0 lb

## 2012-11-17 DIAGNOSIS — I712 Thoracic aortic aneurysm, without rupture: Secondary | ICD-10-CM

## 2012-11-17 DIAGNOSIS — I714 Abdominal aortic aneurysm, without rupture: Secondary | ICD-10-CM

## 2012-11-17 DIAGNOSIS — I77811 Abdominal aortic ectasia: Secondary | ICD-10-CM | POA: Diagnosis not present

## 2012-11-17 DIAGNOSIS — M549 Dorsalgia, unspecified: Secondary | ICD-10-CM

## 2012-11-17 DIAGNOSIS — Z48812 Encounter for surgical aftercare following surgery on the circulatory system: Secondary | ICD-10-CM | POA: Insufficient documentation

## 2012-11-17 MED ORDER — GABAPENTIN 300 MG PO CAPS: 300.0000 mg | ORAL_CAPSULE | Freq: Two times a day (BID) | ORAL | Status: DC

## 2012-11-17 MED ORDER — IOHEXOL 350 MG/ML SOLN
100.0000 mL | Freq: Once | INTRAVENOUS | Status: AC | PRN
  Administered 2012-11-17: 100 mL via INTRAVENOUS

## 2012-11-17 NOTE — Progress Notes (Signed)
The patient comes in today for her first postoperative followup. She is status post endovascular repair of a descending thoracic aortic aneurysm. This was complicated by what was felt to be a type III endoleak. This required her to go back to the operating room on 10/06/2012. She had balloon the molding of her thoracic aortic stent graft. Her back pain improved and she was ultimately able to be discharged to home. She comes in today for further followup. She complains of a circumferential burning in her back which limits her activity at times. This was initially thought to be secondary to shingles however this has been ruled out. She reports no other neurologic symptoms. Her activity level is slowly improving.  The groin access site is well healed.  I have reviewed her CT scan extensively. I feel that the area of concern has dramatically decreased in size. The dictated report suggests that this is a type III endoleak, however I feel that this most likely represents a type II endoleak. I discussed these findings with the patient. She is going to have a repeat CT scan in 6 months to see if this area has completely resolved. I do not think that it needs to be intervened on at this time. I have also given her a prescription for Neurontin to see if this helps with her bandlike burning pain in her chest.

## 2012-11-17 NOTE — Addendum Note (Signed)
Addended by: Melodye Ped C on: 11/17/2012 04:03 PM   Modules accepted: Orders

## 2012-11-17 NOTE — Addendum Note (Signed)
Addended by: Melodye Ped C on: 11/17/2012 03:50 PM   Modules accepted: Orders

## 2012-11-21 ENCOUNTER — Ambulatory Visit (INDEPENDENT_AMBULATORY_CARE_PROVIDER_SITE_OTHER): Payer: Medicare Other | Admitting: *Deleted

## 2012-11-21 DIAGNOSIS — Z7901 Long term (current) use of anticoagulants: Secondary | ICD-10-CM | POA: Diagnosis not present

## 2012-11-21 DIAGNOSIS — I4891 Unspecified atrial fibrillation: Secondary | ICD-10-CM

## 2012-11-21 LAB — POCT INR: INR: 2.4

## 2012-11-24 ENCOUNTER — Encounter: Payer: Self-pay | Admitting: Cardiovascular Disease

## 2012-11-24 ENCOUNTER — Ambulatory Visit (INDEPENDENT_AMBULATORY_CARE_PROVIDER_SITE_OTHER): Payer: Medicare Other | Admitting: Cardiovascular Disease

## 2012-11-24 VITALS — BP 128/86 | HR 51 | Ht 63.5 in | Wt 123.0 lb

## 2012-11-24 DIAGNOSIS — I272 Pulmonary hypertension, unspecified: Secondary | ICD-10-CM

## 2012-11-24 DIAGNOSIS — I2789 Other specified pulmonary heart diseases: Secondary | ICD-10-CM | POA: Diagnosis not present

## 2012-11-24 DIAGNOSIS — I712 Thoracic aortic aneurysm, without rupture: Secondary | ICD-10-CM

## 2012-11-24 DIAGNOSIS — I4891 Unspecified atrial fibrillation: Secondary | ICD-10-CM | POA: Diagnosis not present

## 2012-11-24 NOTE — Assessment & Plan Note (Signed)
[name] [name] has moderate pulmonary hypertension.  This may be due to underlying COPD or may be due to her AI / left sided heart failure .  If she is not found to have any significant pulmonary issues, she may need to see Dr. Gala Romney

## 2012-11-24 NOTE — Assessment & Plan Note (Signed)
She is s/p stent graft repair of her thoracic aortic aneurism.  She will follow up with Dr. Myra Gianotti.

## 2012-11-24 NOTE — Patient Instructions (Addendum)
Your physician has recommended you make the following change in your medication:   STOP TAKING DIGOXIN DUE TO SLOW HEART RATE  Your physician recommends that you schedule a follow-up appointment in: 3 MONTHS WITH EKG

## 2012-11-24 NOTE — Progress Notes (Signed)
[name] [name] Date of Birth  1932-01-06       [facility]    [place] [address]  [address] [place], [place]  [address]   [place], [place]  [address] [phone]     [phone]   Fax  [phone]    Fax [phone]  Problem List: 1. Atrial fibrillation-we have tried her on Rythmol, flecainide, Sotalol, and amiodarone. Her insurance company will not pay for [employer]. 2. Hypertension 3. Thoracic aortic aneurism - s/p stent graft repair.  Complicated by a type III endoleak.  Repaired with another stenting.    History of Present Illness:  She's still having lots of shortness of breath with exertion. We've performed a cardioversion several times. She did not respond to Rythmol, flecainide for amiodarone.  She's been seen by Dr. Johney Frame for consideration for RF ablation. He has determined that she is not a good candidate for RF ablation of her atrial fibrillation.  She is doing pretty well.  She has some dyspnea but not as bad as she used to get. We added Lasix 40 mg a day to her medicine list lessor last year. It caused her to be very fatigued and feeling "wiped out ".  She lowered her dose to 20 mg and is feeling much better. Her breathing is much better since starting the Lasix.  Feb. 6, 2014:  [name] [name] is feeling the same.  She still has dyspnea when walking any distance.  She has lots of hip pain due to arthritis.   Nov 24, 2012:  Since I last saw her she has had her Thoracic aneurism repaired.  The initial stent graft was complicated by an endoleak that has now been repaired.   She is stable from a cardiac standpoint.  She has had some complications from her 2 back / hip surgeries.  She has lots of leg pain with any exercise.     Current Outpatient Prescriptions on File Prior to Visit  Medication Sig Dispense Refill  . acetaminophen (TYLENOL) 500 MG tablet Take 500 mg by mouth every 4 (four) hours as needed for pain.       Marland Kitchen  digoxin (LANOXIN) 0.125 MG tablet Take 125 mcg by mouth at bedtime.      . furosemide (LASIX) 20 MG tablet Take 1 tablet (20 mg total) by mouth daily.  90 tablet  3  . prazosin (MINIPRESS) 2 MG capsule Take 1 capsule (2 mg total) by mouth 2 (two) times daily.  180 capsule  3  . propranolol ER (INDERAL LA) 160 MG SR capsule Take 160 mg by mouth at bedtime.      Marland Kitchen spironolactone (ALDACTONE) 12.5 mg TABS Take 12.5 mg by mouth every morning.      . warfarin (COUMADIN) 5 MG tablet Take 2.5-5 mg by mouth daily. 5mg  Monday, Wednesday, Friday, 2.5 mg all other days.      Marland Kitchen gabapentin (NEURONTIN) 300 MG capsule Take 1 capsule (300 mg total) by mouth 2 (two) times daily.  60 capsule  0   No current facility-administered medications on file prior to visit.    Allergies  Allergen Reactions  . Atorvastatin     Muscle aches  . Amiodarone Hcl     REACTION: Nausea/Vomiting; decrease appetite; affects liver enzymes  . Aspirin   . Diltiazem Hcl     REACTION: Nausea/vomitting  . Flecainide Acetate     REACTION: Intol: nausea/horrible feeling  . Metoprolol Succinate  REACTION: Nausea/vomiting;dizziness  . Sotalol Hcl     REACTION: Nausea/vomiting    Past Medical History  Diagnosis Date  . AF (atrial fibrillation)   . Hypertension   . Aortic insufficiency   . Mitral regurgitation   . Tricuspid regurgitation   . CHF (congestive heart failure)   . Aortic aneurysm   . CAD (coronary artery disease)   . Arthritis   . Claustrophobia   . Shortness of breath   . Broken neck     40 years ago    Past Surgical History  Procedure Laterality Date  . Tonsillectomy    . Cystectomy      sacrum  . Thoracic aortic endovascular stent graft N/A 10/02/2012    Procedure: THORACIC AORTIC ENDOVASCULAR STENT GRAFT;  Surgeon: Nada Libman, MD;  Location: [facility] OR;  Service: Vascular;  Laterality: N/A;  Ultrasound guided  . Thoracic aortic endovascular stent graft N/A 10/05/2012    Procedure: Endovascular  repair of THORACIC AORTIC ANEURYSM;  Surgeon: Nada Libman, MD;  Location: [facility] OR;  Service: Vascular;  Laterality: N/A;  Angioplasty of Aortic throasic aneurysm, x1 aortagram,  Percutaneous acess right femoral artery.    History  Smoking status  . Former Smoker -- 1.00 packs/day for 20 years  . Types: Cigarettes  . Quit date: 08/22/1985  Smokeless tobacco  . Never Used    History  Alcohol Use  . 4.2 oz/week  . 7 Glasses of wine per week    Comment: a glass of wine every night    Family History  Problem Relation Age of Onset  . Aneurysm Mother 35  . Cancer Mother     breast  . Heart disease Mother   . Hypertension Mother   . Hodgkin's lymphoma Father 36  . Cancer Father     hodgkins    Reviw of Systems:  Reviewed in the HPI.  All other systems are negative.  Physical Exam: Blood pressure 128/86, pulse 51, height 5' 3.5" (1.613 m), weight 123 lb (55.792 kg), SpO2 97.00%. General: Well developed, well nourished, in no acute distress.  She is very tan   Head: Normocephalic, atraumatic, sclera non-icteric, mucus membranes are moist,   Neck: Supple. Carotids are 2 + without bruits. No JVD. Her left ear is slightly swollen due to a bug bite this weekend.  Lungs: Clear bilaterally to auscultation.  Heart: Irregularly irregular. She has a 2/6 diastolic murmur at the left sternal border.  Abdomen: Soft, non-tender, non-distended with normal bowel sounds. No hepatomegaly. No rebound/guarding. No masses.  Msk:  Strength and tone are normal  Extremities: No clubbing or cyanosis. No edema.  Distal pedal pulses are 2+ and equal bilaterally.  Neuro: Alert and oriented X 3. Moves all extremities spontaneously.  Psych:  Responds to questions appropriately with a normal affect.  ECG:  Assessment / Plan:

## 2012-11-24 NOTE — Assessment & Plan Note (Addendum)
She has continued to have severe dyspnea.  Especially with exertion.  She has pulmonary hypertension.   She has seen Dr. Sherene Sires but has not been back since we discovered that she had a thoracic aortic aneurism.   She has a hx of smoking but quit 25 years ago.  We may need to send her to Dr. Gala Romney to see in the CHF / pulmonary hypertension clinic.    I will see her again in 3 months.

## 2012-11-28 ENCOUNTER — Telehealth: Payer: Self-pay | Admitting: Cardiovascular Disease

## 2012-11-28 NOTE — Telephone Encounter (Signed)
Spoke with patient who states she was sent a no show notification through [place] but she was here for her appointment on 5/12.  I am sending to [facility] to call patient when she returns to the office.  Patient verbalized understanding.

## 2012-11-28 NOTE — Telephone Encounter (Signed)
New problem    Pt stated she received a letter stating she has a no show charge for 11/24/12 w/dr nahser but pt states she was here for the appt and has the paperwork given to her for checkout-please call pt

## 2012-11-30 ENCOUNTER — Other Ambulatory Visit: Payer: Self-pay | Admitting: *Deleted

## 2012-11-30 ENCOUNTER — Ambulatory Visit (INDEPENDENT_AMBULATORY_CARE_PROVIDER_SITE_OTHER): Payer: Medicare Other | Admitting: Family Medicine

## 2012-11-30 ENCOUNTER — Ambulatory Visit: Payer: Medicare Other

## 2012-11-30 VITALS — BP 126/82 | HR 65 | Temp 97.6°F | Resp 16 | Ht 62.5 in | Wt 124.0 lb

## 2012-11-30 DIAGNOSIS — S8010XA Contusion of unspecified lower leg, initial encounter: Secondary | ICD-10-CM

## 2012-11-30 DIAGNOSIS — D689 Coagulation defect, unspecified: Secondary | ICD-10-CM

## 2012-11-30 DIAGNOSIS — M25569 Pain in unspecified knee: Secondary | ICD-10-CM | POA: Diagnosis not present

## 2012-11-30 DIAGNOSIS — S91009A Unspecified open wound, unspecified ankle, initial encounter: Secondary | ICD-10-CM | POA: Diagnosis not present

## 2012-11-30 DIAGNOSIS — D65 Disseminated intravascular coagulation [defibrination syndrome]: Secondary | ICD-10-CM | POA: Diagnosis not present

## 2012-11-30 DIAGNOSIS — M25561 Pain in right knee: Secondary | ICD-10-CM

## 2012-11-30 DIAGNOSIS — S81009A Unspecified open wound, unspecified knee, initial encounter: Secondary | ICD-10-CM

## 2012-11-30 DIAGNOSIS — S81801A Unspecified open wound, right lower leg, initial encounter: Secondary | ICD-10-CM

## 2012-11-30 MED ORDER — CEPHALEXIN 500 MG PO CAPS: 500.0000 mg | ORAL_CAPSULE | Freq: Four times a day (QID) | ORAL | Status: DC

## 2012-11-30 MED ORDER — TRAMADOL HCL 50 MG PO TABS: 50.0000 mg | ORAL_TABLET | Freq: Four times a day (QID) | ORAL | Status: DC | PRN

## 2012-11-30 NOTE — Patient Instructions (Addendum)
Keflex twice per day to lessen chance of infection.  Tylenol if needed for pain, or if stronger medicine needed - tramadol has been prescribed. Return to the clinic or go to the nearest emergency room if any of your symptoms worsen or new symptoms occur.  Recheck with Dr. Neva Seat after 4pm on Wednesday,   WOUND CARE . Keep area clean and dry for 24 hours. Do not remove bandage, if applied. . After 24 hours, remove bandage and wash wound gently with mild soap and warm water for next 5 days. Reapply a new bandage after cleaning wound. . Do not apply any ointments or creams to the wound while stitches/staples are in place, as this may cause delayed healing. No soaking or sitting in bathtub.  Marland Kitchen Notify the office if you experience any of the following signs of infection: Swelling, redness, pus drainage, streaking, fever >101.0 F . Notify the office if you experience excessive bleeding that does not stop after 15-20 minutes of constant, firm pressure.

## 2012-11-30 NOTE — Progress Notes (Signed)
Subjective:    Patient ID: [name] [name], female    DOB: 05-13-1932, 79 y.o.   MRN: [id number]  HPI [name] [name] is a 79 y.o. female  At [facility] today - about 4:30pm - banged legs into metal cart, small cut on L leg and larger cut on R leg. Able to weight bear without difficulty. On coumadin for Afib. Bandaged.   S/p hospitalization for thoracic aortic aneurysm graft.past month.   Last tetanus in 2010.     Patient Active Problem List   Diagnosis Date Noted  . Aftercare following surgery of the circulatory system, NEC 11/17/2012  . Thoracic aneurysm without mention of rupture 11/17/2012  . Back pain 11/17/2012  . Long term (current) use of anticoagulants 10/23/2012  . Abdominal aneurysm without mention of rupture 09/15/2012  . Descending thoracic aortic aneurysm 08/23/2012  . Diastolic CHF 08/22/2012  . Dyspnea 08/22/2012  . Pulmonary hypertension 08/21/2012  . Hyperlipidemia 11/15/2010  . Atrial fibrillation   . Hypertension   . Aortic insufficiency   . Mitral regurgitation   . Tricuspid regurgitation    Past Surgical History  Procedure Laterality Date  . Tonsillectomy    . Cystectomy      sacrum  . Thoracic aortic endovascular stent graft N/A 10/02/2012    Procedure: THORACIC AORTIC ENDOVASCULAR STENT GRAFT;  Surgeon: Nada Libman, MD;  Location: [facility] OR;  Service: Vascular;  Laterality: N/A;  Ultrasound guided  . Thoracic aortic endovascular stent graft N/A 10/05/2012    Procedure: Endovascular repair of THORACIC AORTIC ANEURYSM;  Surgeon: Nada Libman, MD;  Location: [facility] OR;  Service: Vascular;  Laterality: N/A;  Angioplasty of Aortic throasic aneurysm, x1 aortagram,  Percutaneous acess right femoral artery.   Allergies  Allergen Reactions  . Atorvastatin     Muscle aches  . Amiodarone Hcl     REACTION: Nausea/Vomiting; decrease appetite; affects liver enzymes  . Aspirin   . Diltiazem Hcl     REACTION: Nausea/vomitting  . Flecainide Acetate     REACTION:  Intol: nausea/horrible feeling  . Metoprolol Succinate     REACTION: Nausea/vomiting;dizziness  . Sotalol Hcl     REACTION: Nausea/vomiting   Prior to Admission medications   Medication Sig Start Date End Date Taking? Authorizing Provider  acetaminophen (TYLENOL) 500 MG tablet Take 500 mg by mouth every 4 (four) hours as needed for pain.    Yes Historical Provider, MD  furosemide (LASIX) 20 MG tablet Take 1 tablet (20 mg total) by mouth daily. 08/21/12 08/21/13 Yes Vesta Mixer, MD  prazosin (MINIPRESS) 2 MG capsule Take 1 capsule (2 mg total) by mouth 2 (two) times daily. 01/29/12  Yes Vesta Mixer, MD  propranolol ER (INDERAL LA) 160 MG SR capsule Take 160 mg by mouth at bedtime. 08/05/12  Yes Vesta Mixer, MD  spironolactone (ALDACTONE) 12.5 mg TABS Take 12.5 mg by mouth every morning. 08/21/12  Yes Vesta Mixer, MD  warfarin (COUMADIN) 5 MG tablet Take 2.5-5 mg by mouth daily. 5mg  Monday, Wednesday, Friday, 2.5 mg all other days.   Yes Historical Provider, MD  gabapentin (NEURONTIN) 300 MG capsule Take 1 capsule (300 mg total) by mouth 2 (two) times daily. 11/17/12   Nada Libman, MD   History   Social History  . Marital Status: Widowed    Spouse Name: N/A    Number of Children: 3  . Years of Education: N/A   Occupational History  . Holiday representative    Social  History Main Topics  . Smoking status: Former Smoker -- 1.00 packs/day for 20 years    Types: Cigarettes    Quit date: 08/22/1985  . Smokeless tobacco: Never Used  . Alcohol Use: 4.2 oz/week    7 Glasses of wine per week     Comment: a glass of wine every night  . Drug Use: No  . Sexually Active: Not on file   Other Topics Concern  . Not on file   Social History Narrative  . No narrative on file    Review of Systems  Musculoskeletal: Positive for arthralgias.  Skin: Positive for wound.  Hematological: Bruises/bleeds easily.       Objective:   Physical Exam  Vitals reviewed. Constitutional: She is  oriented to person, place, and time. She appears well-developed and well-nourished.  HENT:  Head: Normocephalic.  Pulmonary/Chest: Effort normal.  Musculoskeletal:       Left lower leg: She exhibits tenderness and bony tenderness. She exhibits no swelling and no deformity.       Legs: Neurological: She is alert and oriented to person, place, and time.  Psychiatric: She has a normal mood and affect. Her behavior is normal.  [facility] reading (PRIMARY) by  Dr. Neva Seat: R tibfib: no fx identified.      Assessment & Plan:  [name] [name] is a 79 y.o. female Pain in joint, lower leg, right - Plan: DG Tibia/Fibula Right, traMADol (ULTRAM) 50 MG tablet  Contusion of leg, multiple sites, unspecified laterality, initial encounter  Open wound of right lower leg, initial encounter - Plan: cephALEXin (KEFLEX) 500 MG capsule  Bleeding disorder due to consumption of coagulants - on Coumadin.   R leg wound/skin tear - approximated with steristrips per other note.  Wound care discussed per h/o.  Start keflex for infection prevention due to depth. Med check reviewed. Tylenol for pain, or if needed - ultram. Sed.  Wound care recheck in 3 days.    Patient Instructions  Keflex twice per day to lessen chance of infection.  Tylenol if needed for pain, or if stronger medicine needed - tramadol has been prescribed. Return to the clinic or go to the nearest emergency room if any of your symptoms worsen or new symptoms occur.  Recheck with Dr. Neva Seat after 4pm on Wednesday,   WOUND CARE . Keep area clean and dry for 24 hours. Do not remove bandage, if applied. . After 24 hours, remove bandage and wash wound gently with mild soap and warm water for next 5 days. Reapply a new bandage after cleaning wound. . Do not apply any ointments or creams to the wound while stitches/staples are in place, as this may cause delayed healing. No soaking or sitting in bathtub.  Marland Kitchen Notify the office if you experience any of the  following signs of infection: Swelling, redness, pus drainage, streaking, fever >101.0 F . Notify the office if you experience excessive bleeding that does not stop after 15-20 minutes of constant, firm pressure.

## 2012-11-30 NOTE — Progress Notes (Signed)
   Patient ID: MAHOGANIE BASHER MRN: [id number], DOB: 06-23-1932, 79 y.o. Date of Encounter: 11/30/2012, 7:17 PM   PROCEDURE NOTE: Verbal consent obtained.  Risks and benefits of the procedure were explained. Patient made an informed decision to proceed with the procedure. Cleansed with soap and water. Irrigated.  Wound explored, no deep structures involved, no foreign bodies.   Skin flap folded down and adhered with steri-strips.  Hemostasis obtained. Wound cleansed and dressed.  Wound care instructions including precautions covered with patient. Handout given.   SignedEula Listen, PA-C 11/30/2012 7:17 PM

## 2012-12-01 ENCOUNTER — Telehealth: Payer: Self-pay

## 2012-12-01 NOTE — Telephone Encounter (Signed)
Her Keflex reads 4/daily on bottle. I advised her this is incorrect and she is to take this bid. For 10 days. She needs to take with food and drink lots of water. She agrees. To you FYI

## 2012-12-01 NOTE — Telephone Encounter (Signed)
Pt has questions about how to take her medication-she is readying  Her appt notes say two different things  Best number [phone]

## 2012-12-02 ENCOUNTER — Ambulatory Visit (INDEPENDENT_AMBULATORY_CARE_PROVIDER_SITE_OTHER): Payer: Medicare Other | Admitting: *Deleted

## 2012-12-02 DIAGNOSIS — I4891 Unspecified atrial fibrillation: Secondary | ICD-10-CM | POA: Diagnosis not present

## 2012-12-02 DIAGNOSIS — Z7901 Long term (current) use of anticoagulants: Secondary | ICD-10-CM | POA: Diagnosis not present

## 2012-12-03 ENCOUNTER — Ambulatory Visit (INDEPENDENT_AMBULATORY_CARE_PROVIDER_SITE_OTHER): Payer: Medicare Other | Admitting: Family Medicine

## 2012-12-03 VITALS — BP 117/79 | HR 54 | Temp 98.1°F | Resp 16 | Ht 62.5 in | Wt 124.0 lb

## 2012-12-03 DIAGNOSIS — Z5189 Encounter for other specified aftercare: Secondary | ICD-10-CM

## 2012-12-03 DIAGNOSIS — S81801D Unspecified open wound, right lower leg, subsequent encounter: Secondary | ICD-10-CM

## 2012-12-03 NOTE — Patient Instructions (Addendum)
Change bandage each day. Clean with soap and water.  Follow up with Eula Listen, PA on Saturday.

## 2012-12-03 NOTE — Progress Notes (Signed)
  Subjective:    Patient ID: [name] [name], female    DOB: June 21, 1932, 79 y.o.   MRN: [id number]  HPI [name] [name] is a 79 y.o. female See ov 11/30/12  - here for follow up of leg wounds after contusion on cart at store.     Steri strips placed on leg wounds, started on antibiotic - KEflex 500mg  BID, no new side effects. S/p 2 days use. , no fever, no increasing redness.  Oozing some blood - stops with pressure, some after some steri strips came off with bandage. Washing with soap and water each day.   Review of Systems  Constitutional: Negative for fever and chills.  Skin: Positive for wound.       Objective:   Physical Exam  Skin: Skin is warm and dry. No rash noted. No erythema.     5 new steristrips applied inferolaterally and 2 small xeroform patches applied to open areas superiorly.       Assessment & Plan:  [name] [name] is a 79 y.o. female Wound of right leg, subsequent encounter skin tears - no sign of infection, tolerating abx. New steristrips applied as above, and xeroform replaced on upper wound. telfa and bandage applied. Continue soap/water lightly wash qd, follow up with Eula Listen in 3 days. Sooner if worse.   Patient Instructions  Change bandage each day. Clean with soap and water.  Follow up with Eula Listen, PA on Saturday.

## 2012-12-06 ENCOUNTER — Ambulatory Visit (INDEPENDENT_AMBULATORY_CARE_PROVIDER_SITE_OTHER): Payer: Medicare Other | Admitting: Physician Assistant

## 2012-12-06 VITALS — BP 110/66 | HR 66 | Temp 98.7°F | Resp 16 | Ht 62.0 in | Wt 123.0 lb

## 2012-12-06 DIAGNOSIS — S81009A Unspecified open wound, unspecified knee, initial encounter: Secondary | ICD-10-CM

## 2012-12-06 DIAGNOSIS — S81809D Unspecified open wound, unspecified lower leg, subsequent encounter: Secondary | ICD-10-CM

## 2012-12-06 DIAGNOSIS — Z5189 Encounter for other specified aftercare: Secondary | ICD-10-CM

## 2012-12-06 DIAGNOSIS — S81819D Laceration without foreign body, unspecified lower leg, subsequent encounter: Secondary | ICD-10-CM

## 2012-12-06 NOTE — Progress Notes (Signed)
Patient ID: [name] [name] MRN: [id number], DOB: 1932/07/07 79 y.o. Date of Encounter: 12/06/2012, 3:46 PM  Primary Physician: [name], MD  Chief Complaint: Wound care   See previous note  HPI: 79 y.o. female presents for wound care. Doing well No issues or complaints Afebrile/ no chills No nausea or vomiting Tolerating Keflex Pain improving Daily dressing change Previous note reviewed  Past Medical History  Diagnosis Date  . AF (atrial fibrillation)   . Hypertension   . Aortic insufficiency   . Mitral regurgitation   . Tricuspid regurgitation   . CHF (congestive heart failure)   . Aortic aneurysm   . CAD (coronary artery disease)   . Arthritis   . Claustrophobia   . Shortness of breath   . Broken neck     40 years ago     Home Meds: Prior to Admission medications   Medication Sig Start Date End Date Taking? Authorizing Provider  acetaminophen (TYLENOL) 500 MG tablet Take 500 mg by mouth every 4 (four) hours as needed for pain.    Yes Historical Provider, MD  cephALEXin (KEFLEX) 500 MG capsule Take 1 capsule (500 mg total) by mouth 4 (four) times daily. 11/30/12  Yes Shade Flood, MD  furosemide (LASIX) 20 MG tablet Take 1 tablet (20 mg total) by mouth daily. 08/21/12 08/21/13 Yes Vesta Mixer, MD  gabapentin (NEURONTIN) 300 MG capsule Take 1 capsule (300 mg total) by mouth 2 (two) times daily. 11/17/12  Yes Nada Libman, MD  prazosin (MINIPRESS) 2 MG capsule Take 1 capsule (2 mg total) by mouth 2 (two) times daily. 01/29/12  Yes Vesta Mixer, MD  propranolol ER (INDERAL LA) 160 MG SR capsule Take 160 mg by mouth at bedtime. 08/05/12  Yes Vesta Mixer, MD  spironolactone (ALDACTONE) 12.5 mg TABS Take 12.5 mg by mouth every morning. 08/21/12  Yes Vesta Mixer, MD  traMADol (ULTRAM) 50 MG tablet Take 1 tablet (50 mg total) by mouth every 6 (six) hours as needed for pain. 11/30/12  Yes Shade Flood, MD  warfarin (COUMADIN) 5 MG tablet Take 2.5-5  mg by mouth daily. 5mg  Monday, Wednesday, Friday, 2.5 mg all other days.   Yes Historical Provider, MD    Allergies:  Allergies  Allergen Reactions  . Atorvastatin     Muscle aches  . Amiodarone Hcl     REACTION: Nausea/Vomiting; decrease appetite; affects liver enzymes  . Aspirin   . Diltiazem Hcl     REACTION: Nausea/vomitting  . Flecainide Acetate     REACTION: Intol: nausea/horrible feeling  . Metoprolol Succinate     REACTION: Nausea/vomiting;dizziness  . Sotalol Hcl     REACTION: Nausea/vomiting    ROS: Constitutional: Afebrile, no chills Cardiovascular: negative for chest pain or palpitations Dermatological: Positive for wound and improving pain. Negative for erythema or warmth  GI: No nausea or vomiting   EXAM: Physical Exam: Blood pressure 110/66, pulse 66, temperature 98.7 F (37.1 C), temperature source Oral, resp. rate 16, height 5\' 2"  (1.575 m), weight 123 lb (55.792 kg), SpO2 96.00%., Body mass index is 22.49 kg/(m^2). General: Well developed, well nourished, in no acute distress. Nontoxic appearing. Head: Normocephalic, atraumatic, sclera non-icteric.  Neck: Supple. Lungs: Breathing is unlabored. Heart: Normal rate. Skin:  Warm and moist. Dressing in place. No tenderness to palpation. Left leg with skin tear and intact steri strip. No erythema, bleeding, or discharge. Right leg with skin tear and intact steri strips laterally. Medial  strips not intact at base. Slight serosanguinous discharge. Mild local erythema. No warmth.    Neuro: Alert and oriented X 3. Moves all extremities spontaneously. Normal gait.  Psych:  Responds to questions appropriately with a normal affect.        A/P: 79 y.o. female with skin tear bilateral legs -Wound care per above -Replaced 2 steri strips inferomedial portion of right wound  -Continue Keflex -Pain well controlled -Daily dressing changes -Wash with soap and water daily -Use bigger bandage to prevent adhesive from  pulling off steri strips   -Recheck 12/11/12  Signed, Eula Listen, PA-C 12/06/2012 3:46 PM

## 2012-12-11 ENCOUNTER — Ambulatory Visit (INDEPENDENT_AMBULATORY_CARE_PROVIDER_SITE_OTHER): Payer: Medicare Other | Admitting: Family Medicine

## 2012-12-11 VITALS — BP 104/60 | HR 61 | Temp 97.4°F | Resp 16 | Ht 62.5 in | Wt 122.8 lb

## 2012-12-11 DIAGNOSIS — T148XXA Other injury of unspecified body region, initial encounter: Secondary | ICD-10-CM

## 2012-12-11 DIAGNOSIS — Z5189 Encounter for other specified aftercare: Secondary | ICD-10-CM

## 2012-12-11 DIAGNOSIS — IMO0002 Reserved for concepts with insufficient information to code with codable children: Secondary | ICD-10-CM

## 2012-12-11 DIAGNOSIS — S81001D Unspecified open wound, right knee, subsequent encounter: Secondary | ICD-10-CM

## 2012-12-11 MED ORDER — DOXYCYCLINE HYCLATE 100 MG PO CAPS: 100.0000 mg | ORAL_CAPSULE | Freq: Two times a day (BID) | ORAL | Status: DC

## 2012-12-11 NOTE — Progress Notes (Signed)
Patient ID: ADLYNN LOWENSTEIN MRN: [id number], DOB: Oct 06, 1931 79 y.o. Date of Encounter: 12/11/2012, 10:45 AM  Primary Physician: [name], MD  Chief Complaint: Wound care   See previous note  HPI: 79 y.o. female presents for wound care. Doing well No issues or complaints Afebrile/ no chills No nausea or vomiting Finished Keflex today Some pain along superior and inferior aspects of wound No drainage Daily dressing change Previous note reviewed Coumadin managed by Coumadin clinic Last INR 2.2 or 2.3 two weeks ago Next INR on December 23, 2012  Past Medical History  Diagnosis Date  . AF (atrial fibrillation)   . Hypertension   . Aortic insufficiency   . Mitral regurgitation   . Tricuspid regurgitation   . CHF (congestive heart failure)   . Aortic aneurysm   . CAD (coronary artery disease)   . Arthritis   . Claustrophobia   . Shortness of breath   . Broken neck     40 years ago     Home Meds: Prior to Admission medications   Medication Sig Start Date End Date Taking? Authorizing Provider  acetaminophen (TYLENOL) 500 MG tablet Take 500 mg by mouth every 4 (four) hours as needed for pain.    Yes Historical Provider, MD  furosemide (LASIX) 20 MG tablet Take 1 tablet (20 mg total) by mouth daily. 08/21/12 08/21/13 Yes Vesta Mixer, MD  prazosin (MINIPRESS) 2 MG capsule Take 1 capsule (2 mg total) by mouth 2 (two) times daily. 01/29/12  Yes Vesta Mixer, MD  propranolol ER (INDERAL LA) 160 MG SR capsule Take 160 mg by mouth at bedtime. 08/05/12  Yes Vesta Mixer, MD  spironolactone (ALDACTONE) 12.5 mg TABS Take 12.5 mg by mouth every morning. 08/21/12  Yes Vesta Mixer, MD  warfarin (COUMADIN) 5 MG tablet Take 2.5-5 mg by mouth daily. 5mg  Monday, Wednesday, Friday, 2.5 mg all other days.   Yes Historical Provider, MD  cephALEXin (KEFLEX) 500 MG capsule Take 1 capsule (500 mg total) by mouth 4 (four) times daily. 11/30/12  Yes Shade Flood, MD  gabapentin  (NEURONTIN) 300 MG capsule Take 1 capsule (300 mg total) by mouth 2 (two) times daily. 11/17/12  No Nada Libman, MD  traMADol (ULTRAM) 50 MG tablet Take 1 tablet (50 mg total) by mouth every 6 (six) hours as needed for pain. 11/30/12  No Shade Flood, MD    Allergies:  Allergies  Allergen Reactions  . Atorvastatin     Muscle aches  . Amiodarone Hcl     REACTION: Nausea/Vomiting; decrease appetite; affects liver enzymes  . Aspirin   . Diltiazem Hcl     REACTION: Nausea/vomitting  . Flecainide Acetate     REACTION: Intol: nausea/horrible feeling  . Metoprolol Succinate     REACTION: Nausea/vomiting;dizziness  . Sotalol Hcl     REACTION: Nausea/vomiting    ROS: Constitutional: Afebrile, no chills Cardiovascular: negative for chest pain or palpitations Dermatological: Positive for wound, erythema, and pain. GI: No nausea or vomiting   EXAM: Physical Exam: Blood pressure 104/60, pulse 61, temperature 97.4 F (36.3 C), temperature source Oral, resp. rate 16, height 5' 2.5" (1.588 m), weight 122 lb 12.8 oz (55.702 kg), SpO2 97.00%., Body mass index is 22.09 kg/(m^2). General: Well developed, well nourished, in no acute distress. Nontoxic appearing. Head: Normocephalic, atraumatic, sclera non-icteric.  Neck: Supple. Lungs: Breathing is unlabored. Heart: Normal rate. Skin:  Warm and moist. Dressing and packing in place. Mild surrounding  erythema. TTP at 11 o'clock and 7 o'clock. No purulent drainage. No induration or fluctuance. Neuro: Alert and oriented X 3. Moves all extremities spontaneously. Normal gait.  Psych:  Responds to questions appropriately with a normal affect.       A/P: 79 y.o. female with skin tear of bilateral legs -Wound care per above -Start Doxycycline 100 mg 1 po bid #20 no RF -Call Coumadin clinic today and advise them she has been placed on Doxcycline. Ask them if they would like her to come in within the next 48-72 hours for PT/INR secondary to  addition of Doxycycline. Risks discussed.  -Pain well controlled -Daily dressing changes -Recheck 1 week  Signed, Eula Listen, PA-C 12/11/2012 10:45 AM

## 2012-12-11 NOTE — Progress Notes (Signed)
Patient discussed with Ryan Dunn, PA-C. Agree with assessment and plan of care per his note.   

## 2012-12-13 ENCOUNTER — Telehealth: Payer: Self-pay

## 2012-12-13 NOTE — Telephone Encounter (Signed)
Pt has infected leg. Finished taking Keflex and ryan prescribed doxycycline. This is making her nauseous. Can she get something else?  [facility] on  [address]

## 2012-12-13 NOTE — Telephone Encounter (Signed)
Taking doxy with food and drinking water. After she takes it she gets very nauseated and vomits then feels better. Wants to know if can change to something else. Please advise?

## 2012-12-14 MED ORDER — SULFAMETHOXAZOLE-TRIMETHOPRIM 800-160 MG PO TABS: 1.0000 | ORAL_TABLET | Freq: Two times a day (BID) | ORAL | Status: DC

## 2012-12-14 NOTE — Telephone Encounter (Signed)
Stop the doxycycline due to nausea/vomiting.  Meds ordered this encounter  Medications  . sulfamethoxazole-trimethoprim (BACTRIM DS,SEPTRA DS) 800-160 MG per tablet    Sig: Take 1 tablet by mouth 2 (two) times daily.    Dispense:  20 tablet    Refill:  0    Order Specific Question:  Supervising Provider    Answer:  DOOLITTLE, ROBERT P [[id number]]

## 2012-12-15 NOTE — Telephone Encounter (Signed)
Pt stated that she is ok because she is taking two probiotics and that helps with the nausea and vomiting.

## 2012-12-19 ENCOUNTER — Ambulatory Visit (INDEPENDENT_AMBULATORY_CARE_PROVIDER_SITE_OTHER): Payer: Medicare Other | Admitting: Physician Assistant

## 2012-12-19 VITALS — BP 92/60 | HR 72 | Temp 97.5°F | Resp 16 | Ht 62.0 in | Wt 122.0 lb

## 2012-12-19 DIAGNOSIS — T148XXA Other injury of unspecified body region, initial encounter: Secondary | ICD-10-CM | POA: Diagnosis not present

## 2012-12-19 DIAGNOSIS — IMO0002 Reserved for concepts with insufficient information to code with codable children: Secondary | ICD-10-CM

## 2012-12-19 LAB — POCT CBC
Granulocyte percent: 74 %G (ref 37–80)
Hemoglobin: 13.4 g/dL (ref 12.2–16.2)
MPV: 8.1 fL (ref 0–99.8)
POC Granulocyte: 5.4 (ref 2–6.9)
POC MID %: 8.9 %M (ref 0–12)
RBC: 4.33 M/uL (ref 4.04–5.48)
WBC: 7.3 10*3/uL (ref 4.6–10.2)

## 2012-12-19 NOTE — Progress Notes (Signed)
Patient ID: [name] [name] MRN: [id number], DOB: 1931/11/06, 79 y.o. Date of Encounter: 12/19/2012, 12:41 PM  Primary Physician: [name], MD  Chief Complaint: Wound care  HPI: 79 y.o. female with history below presents for wound care.  Doing well.  No issues or complaints.  Afebrile/no chills.  No nausea or vomiting. Tolerating Doxycycline.  Erythema resolving.  No drainage.  Daily dressing change.     Past Medical History  Diagnosis Date  . AF (atrial fibrillation)   . Hypertension   . Aortic insufficiency   . Mitral regurgitation   . Tricuspid regurgitation   . CHF (congestive heart failure)   . Aortic aneurysm   . CAD (coronary artery disease)   . Arthritis   . Claustrophobia   . Shortness of breath   . Broken neck     40 years ago     Home Meds: Prior to Admission medications   Medication Sig Start Date End Date Taking? Authorizing Provider  acetaminophen (TYLENOL) 500 MG tablet Take 500 mg by mouth every 4 (four) hours as needed for pain.    Yes Historical Provider, MD  furosemide (LASIX) 20 MG tablet Take 1 tablet (20 mg total) by mouth daily. 08/21/12 08/21/13 Yes Vesta Mixer, MD  gabapentin (NEURONTIN) 300 MG capsule Take 1 capsule (300 mg total) by mouth 2 (two) times daily. 11/17/12  Yes Nada Libman, MD  prazosin (MINIPRESS) 2 MG capsule Take 1 capsule (2 mg total) by mouth 2 (two) times daily. 01/29/12  Yes Vesta Mixer, MD  propranolol ER (INDERAL LA) 160 MG SR capsule Take 160 mg by mouth at bedtime. 08/05/12  Yes Vesta Mixer, MD  spironolactone (ALDACTONE) 12.5 mg TABS Take 12.5 mg by mouth every morning. 08/21/12  Yes Vesta Mixer, MD  sulfamethoxazole-trimethoprim (BACTRIM DS,SEPTRA DS) 800-160 MG per tablet Take 1 tablet by mouth 2 (two) times daily. 12/14/12  Yes Chelle S Jeffery, PA-C  warfarin (COUMADIN) 5 MG tablet Take 2.5-5 mg by mouth daily. 5mg  Monday, Wednesday, Friday, 2.5 mg all other days.   Yes Historical Provider, MD    traMADol (ULTRAM) 50 MG tablet Take 1 tablet (50 mg total) by mouth every 6 (six) hours as needed for pain. 11/30/12   Shade Flood, MD    Allergies:  Allergies  Allergen Reactions  . Atorvastatin     Muscle aches  . Amiodarone Hcl     REACTION: Nausea/Vomiting; decrease appetite; affects liver enzymes  . Aspirin   . Diltiazem Hcl     REACTION: Nausea/vomitting  . Flecainide Acetate     REACTION: Intol: nausea/horrible feeling  . Metoprolol Succinate     REACTION: Nausea/vomiting;dizziness  . Sotalol Hcl     REACTION: Nausea/vomiting    History   Social History  . Marital Status: Widowed    Spouse Name: N/A    Number of Children: 3  . Years of Education: N/A   Occupational History  . Holiday representative    Social History Main Topics  . Smoking status: Former Smoker -- 1.00 packs/day for 20 years    Types: Cigarettes    Quit date: 08/22/1985  . Smokeless tobacco: Never Used  . Alcohol Use: 4.2 oz/week    7 Glasses of wine per week     Comment: a glass of wine every night  . Drug Use: No  . Sexually Active: Not on file   Other Topics Concern  . Not on file   Social History Narrative  .  No narrative on file     Review of Systems: Constitutional: negative for chills or fever Dermatological: positive for wound, erythema, and pain   Physical Exam: Blood pressure 92/60, pulse 72, temperature 97.5 F (36.4 C), temperature source Oral, resp. rate 16, height 5\' 2"  (1.575 m), weight 122 lb (55.339 kg), SpO2 97.00%., Body mass index is 22.31 kg/(m^2). General: Well developed, well nourished, in no acute distress. Head: Normocephalic, atraumatic, eyes without discharge, sclera non-icteric, nares are without discharge.   Neck: Supple. Full ROM.  Lungs: Breathing is unlabored. Heart: Regular rate. Msk:  Strength and tone normal for age. Extremities/Skin: Warm and dry. No clubbing or cyanosis. No edema. Mostly resolved erythema. Small amount remaining along medial aspect  of eschar. No purulent drainage. No induration or fluctuance.  Neuro: Alert and oriented X 3. Moves all extremities spontaneously. Gait is normal. CNII-XII grossly in tact. Psych:  Responds to questions appropriately with a normal affect.     ASSESSMENT AND PLAN:  79 y.o. female with resolving skin tear  -Resolving -Finish Doxycycline -F/u prn   Signed, Eula Listen, PA-C 12/19/2012 12:41 PM

## 2012-12-23 ENCOUNTER — Ambulatory Visit (INDEPENDENT_AMBULATORY_CARE_PROVIDER_SITE_OTHER): Payer: Medicare Other

## 2012-12-23 DIAGNOSIS — Z7901 Long term (current) use of anticoagulants: Secondary | ICD-10-CM

## 2012-12-23 DIAGNOSIS — I4891 Unspecified atrial fibrillation: Secondary | ICD-10-CM

## 2012-12-23 LAB — POCT INR: INR: 2.2

## 2012-12-30 ENCOUNTER — Ambulatory Visit (INDEPENDENT_AMBULATORY_CARE_PROVIDER_SITE_OTHER): Payer: Medicare Other | Admitting: Physician Assistant

## 2012-12-30 VITALS — BP 122/80 | HR 81 | Temp 97.3°F | Resp 18 | Ht 62.5 in | Wt 121.8 lb

## 2012-12-30 DIAGNOSIS — T148XXA Other injury of unspecified body region, initial encounter: Secondary | ICD-10-CM

## 2012-12-30 DIAGNOSIS — IMO0002 Reserved for concepts with insufficient information to code with codable children: Secondary | ICD-10-CM

## 2012-12-30 NOTE — Progress Notes (Signed)
Patient ID: [name] [name] MRN: [id number], DOB: Feb 25, 1932, 79 y.o. Date of Encounter: 12/30/2012, 12:50 PM  Primary Physician: [name], MD  Chief Complaint: Wound care  HPI: 79 y.o. female with history below presents for wound care.  Doing well.  No issues or complaints.  Afebrile/no chills.  No nausea or vomiting.  Finished Doxycycline.  Erythema resolved.  No drainage.  Daily dressing change.    Past Medical History  Diagnosis Date  . AF (atrial fibrillation)   . Hypertension   . Aortic insufficiency   . Mitral regurgitation   . Tricuspid regurgitation   . CHF (congestive heart failure)   . Aortic aneurysm   . CAD (coronary artery disease)   . Arthritis   . Claustrophobia   . Shortness of breath   . Broken neck     40 years ago     Home Meds: Prior to Admission medications   Medication Sig Start Date End Date Taking? Authorizing Provider  acetaminophen (TYLENOL) 500 MG tablet Take 500 mg by mouth every 4 (four) hours as needed for pain.    Yes Historical Provider, MD  furosemide (LASIX) 20 MG tablet Take 1 tablet (20 mg total) by mouth daily. 08/21/12 08/21/13 Yes Vesta Mixer, MD  prazosin (MINIPRESS) 2 MG capsule Take 1 capsule (2 mg total) by mouth 2 (two) times daily. 01/29/12  Yes Vesta Mixer, MD  propranolol ER (INDERAL LA) 160 MG SR capsule Take 160 mg by mouth at bedtime. 08/05/12  Yes Vesta Mixer, MD  spironolactone (ALDACTONE) 12.5 mg TABS Take 12.5 mg by mouth every morning. 08/21/12  Yes Vesta Mixer, MD  warfarin (COUMADIN) 5 MG tablet Take 2.5-5 mg by mouth daily. Every day except wed take 2.5 mg and wed take 5 mg   Yes Historical Provider, MD    Allergies:  Allergies  Allergen Reactions  . Atorvastatin     Muscle aches  . Amiodarone Hcl     REACTION: Nausea/Vomiting; decrease appetite; affects liver enzymes  . Aspirin   . Diltiazem Hcl     REACTION: Nausea/vomitting  . Flecainide Acetate     REACTION: Intol:  nausea/horrible feeling  . Metoprolol Succinate     REACTION: Nausea/vomiting;dizziness  . Sotalol Hcl     REACTION: Nausea/vomiting    History   Social History  . Marital Status: Widowed    Spouse Name: N/A    Number of Children: 3  . Years of Education: N/A   Occupational History  . Holiday representative    Social History Main Topics  . Smoking status: Former Smoker -- 1.00 packs/day for 20 years    Types: Cigarettes    Quit date: 08/22/1985  . Smokeless tobacco: Never Used  . Alcohol Use: 4.2 oz/week    7 Glasses of wine per week     Comment: a glass of wine every night  . Drug Use: No  . Sexually Active: Not on file   Other Topics Concern  . Not on file   Social History Narrative  . No narrative on file     Review of Systems: Constitutional: negative for chills or fever  Dermatological: see above   Physical Exam: Blood pressure 122/80, pulse 81, temperature 97.3 F (36.3 C), temperature source Oral, resp. rate 18, height 5' 2.5" (1.588 m), weight 121 lb 12.8 oz (55.248 kg), SpO2 98.00%., Body mass index is 21.91 kg/(m^2). General: Well developed, well nourished, in no acute distress. Head: Normocephalic, atraumatic, eyes without  discharge, sclera non-icteric, nares are without discharge.   Neck: Supple. Full ROM.  Lungs: Breathing is unlabored. Heart: Regular rate. Msk:  Strength and tone normal for age. Extremities/Skin: Warm and dry. No clubbing or cyanosis. No edema. Resolved erythema. No purulent drainage. No induration or fluctuance. Medial superior scab removed as it was loose revealing healthy tissue beneath.  Neuro: Alert and oriented X 3. Moves all extremities spontaneously. Gait is normal. CNII-XII grossly in tact. Psych:  Responds to questions appropriately with a normal affect.     ASSESSMENT AND PLAN:  79 y.o. female with resolving skin tear -Resolving -Follow up prn  Signed, Eula Listen, PA-C 12/30/2012 12:50 PM

## 2013-01-20 ENCOUNTER — Other Ambulatory Visit: Payer: Self-pay | Admitting: *Deleted

## 2013-01-20 ENCOUNTER — Ambulatory Visit (INDEPENDENT_AMBULATORY_CARE_PROVIDER_SITE_OTHER): Payer: Medicare Other | Admitting: *Deleted

## 2013-01-20 DIAGNOSIS — Z7901 Long term (current) use of anticoagulants: Secondary | ICD-10-CM

## 2013-01-20 DIAGNOSIS — I4891 Unspecified atrial fibrillation: Secondary | ICD-10-CM

## 2013-01-20 DIAGNOSIS — E785 Hyperlipidemia, unspecified: Secondary | ICD-10-CM

## 2013-01-20 LAB — POCT INR: INR: 2

## 2013-01-20 MED ORDER — PRAZOSIN HCL 2 MG PO CAPS: 2.0000 mg | ORAL_CAPSULE | Freq: Two times a day (BID) | ORAL | Status: DC

## 2013-01-20 MED ORDER — PROPRANOLOL HCL ER 160 MG PO CP24: 160.0000 mg | ORAL_CAPSULE | Freq: Every day | ORAL | Status: DC

## 2013-02-04 ENCOUNTER — Ambulatory Visit (INDEPENDENT_AMBULATORY_CARE_PROVIDER_SITE_OTHER): Payer: Medicare Other | Admitting: Physician Assistant

## 2013-02-04 ENCOUNTER — Other Ambulatory Visit: Payer: Self-pay | Admitting: *Deleted

## 2013-02-04 VITALS — BP 108/62 | HR 58 | Temp 97.7°F | Resp 17 | Ht 62.5 in | Wt 124.0 lb

## 2013-02-04 DIAGNOSIS — Z299 Encounter for prophylactic measures, unspecified: Secondary | ICD-10-CM

## 2013-02-04 DIAGNOSIS — T148XXA Other injury of unspecified body region, initial encounter: Secondary | ICD-10-CM | POA: Diagnosis not present

## 2013-02-04 DIAGNOSIS — IMO0002 Reserved for concepts with insufficient information to code with codable children: Secondary | ICD-10-CM

## 2013-02-04 MED ORDER — AMOXICILLIN 500 MG PO CAPS: ORAL_CAPSULE | ORAL | Status: DC

## 2013-02-04 NOTE — Progress Notes (Signed)
Patient called to inquire about antibiotic use prior to getting an extensive dental procedure.( Had TEVAR on 10-06-12 by Dr. Myra Gianotti). Dr. Edilia Bo ordered standard prophylactic antibiotic regimen; patient states that she is not allergic to penicillin. I faxed Amoxicillin Rx to [facility] as per her request. She voiced understanding of the medication instructions.

## 2013-02-04 NOTE — Progress Notes (Signed)
Patient ID: [name] [name] MRN: [id number], DOB: June 01, 1932, 79 y.o. Date of Encounter: 02/04/2013, 4:37 PM  Primary Physician: [name], MD  Chief Complaint: Follow up skin tear  HPI: 79 y.o. female with history below presents for follow up of skin tear of the distal anterior right shin. Doing well. No issues or complaints. No pain. Afebrile. Scab has mostly resolved. She just has 2 small scabs remaining that she would like for me to look at today. No drainage.    Past Medical History  Diagnosis Date  . AF (atrial fibrillation)   . Hypertension   . Aortic insufficiency   . Mitral regurgitation   . Tricuspid regurgitation   . CHF (congestive heart failure)   . Aortic aneurysm   . CAD (coronary artery disease)   . Arthritis   . Claustrophobia   . Shortness of breath   . Broken neck     40 years ago     Home Meds: Prior to Admission medications   Medication Sig Start Date End Date Taking? Authorizing Provider  acetaminophen (TYLENOL) 500 MG tablet Take 500 mg by mouth every 4 (four) hours as needed for pain.    Yes Historical Provider, MD  amoxicillin (AMOXIL) 500 MG capsule Take 4 capsules ( 2 grams total) by mouth one hour prior to your procedure. 02/04/13  Yes Nada Libman, MD  furosemide (LASIX) 20 MG tablet Take 1 tablet (20 mg total) by mouth daily. 08/21/12 08/21/13 Yes Vesta Mixer, MD  prazosin (MINIPRESS) 2 MG capsule Take 1 capsule (2 mg total) by mouth 2 (two) times daily. 01/20/13  Yes Vesta Mixer, MD  propranolol ER (INDERAL LA) 160 MG SR capsule Take 1 capsule (160 mg total) by mouth at bedtime. 01/20/13  Yes Vesta Mixer, MD  spironolactone (ALDACTONE) 12.5 mg TABS Take 12.5 mg by mouth every morning. 08/21/12  Yes Vesta Mixer, MD  warfarin (COUMADIN) 5 MG tablet Take 2.5-5 mg by mouth daily. Every day except wed take 2.5 mg and wed take 5 mg   Yes Historical Provider, MD    Allergies:  Allergies  Allergen Reactions  . Atorvastatin    Muscle aches  . Amiodarone Hcl     REACTION: Nausea/Vomiting; decrease appetite; affects liver enzymes  . Aspirin   . Diltiazem Hcl     REACTION: Nausea/vomitting  . Flecainide Acetate     REACTION: Intol: nausea/horrible feeling  . Metoprolol Succinate     REACTION: Nausea/vomiting;dizziness  . Sotalol Hcl     REACTION: Nausea/vomiting    History   Social History  . Marital Status: Widowed    Spouse Name: N/A    Number of Children: 3  . Years of Education: N/A   Occupational History  . Holiday representative    Social History Main Topics  . Smoking status: Former Smoker -- 1.00 packs/day for 20 years    Types: Cigarettes    Quit date: 08/22/1985  . Smokeless tobacco: Never Used  . Alcohol Use: 4.2 oz/week    7 Glasses of wine per week     Comment: a glass of wine every night  . Drug Use: No  . Sexually Active: Not on file   Other Topics Concern  . Not on file   Social History Narrative  . No narrative on file     Review of Systems: Constitutional: negative for chills, fever, or fatigue  Dermatological: see above   Physical Exam: Blood pressure 108/62, pulse 58, temperature  97.7 F (36.5 C), temperature source Oral, resp. rate 17, height 5' 2.5" (1.588 m), weight 124 lb (56.246 kg), SpO2 98.00%., Body mass index is 22.3 kg/(m^2). General: Well developed, well nourished, in no acute distress. Head: Normocephalic, atraumatic, eyes without discharge, sclera non-icteric, nares are without discharge.  Neck: Supple. Full ROM.  Lungs: Breathing is unlabored. Heart: Regular rate. Msk:  Strength and tone normal for age. Extremities/Skin: Warm and dry. No clubbing or cyanosis. No edema. No rashes or suspicious lesions. Dried scab easily lifted to reveal healed tissue beneath. Scab removed. Wound resolved.  Neuro: Alert and oriented X 3. Moves all extremities spontaneously. Gait is normal. CNII-XII grossly in tact. Psych:  Responds to questions appropriately with a normal  affect.     ASSESSMENT AND PLAN:  79 y.o. female with resolved skin tear of the distal anterior right shin -Wound resolved -RTC prn   Signed, Eula Listen, PA-C 02/04/2013 4:37 PM

## 2013-02-17 ENCOUNTER — Ambulatory Visit (INDEPENDENT_AMBULATORY_CARE_PROVIDER_SITE_OTHER): Payer: Medicare Other | Admitting: *Deleted

## 2013-02-17 DIAGNOSIS — Z7901 Long term (current) use of anticoagulants: Secondary | ICD-10-CM

## 2013-02-17 DIAGNOSIS — I4891 Unspecified atrial fibrillation: Secondary | ICD-10-CM | POA: Diagnosis not present

## 2013-02-17 LAB — POCT INR: INR: 1.9

## 2013-02-18 ENCOUNTER — Other Ambulatory Visit: Payer: Self-pay

## 2013-02-26 ENCOUNTER — Ambulatory Visit: Payer: Medicare Other | Admitting: Cardiovascular Disease

## 2013-02-26 ENCOUNTER — Telehealth: Payer: Self-pay | Admitting: *Deleted

## 2013-02-26 ENCOUNTER — Telehealth: Payer: Self-pay | Admitting: Cardiovascular Disease

## 2013-02-26 DIAGNOSIS — Z299 Encounter for prophylactic measures, unspecified: Secondary | ICD-10-CM

## 2013-02-26 MED ORDER — AMOXICILLIN 500 MG PO CAPS: ORAL_CAPSULE | ORAL | Status: DC

## 2013-02-26 NOTE — Telephone Encounter (Deleted)
error 

## 2013-02-26 NOTE — Telephone Encounter (Signed)
Pt is having more dental work, faxed another Rx for Amoxicillin to [facility] per standing order.

## 2013-03-05 ENCOUNTER — Ambulatory Visit (INDEPENDENT_AMBULATORY_CARE_PROVIDER_SITE_OTHER): Payer: Medicare Other | Admitting: *Deleted

## 2013-03-05 DIAGNOSIS — I4891 Unspecified atrial fibrillation: Secondary | ICD-10-CM | POA: Diagnosis not present

## 2013-03-05 DIAGNOSIS — Z7901 Long term (current) use of anticoagulants: Secondary | ICD-10-CM | POA: Diagnosis not present

## 2013-03-05 LAB — POCT INR: INR: 2

## 2013-03-17 ENCOUNTER — Ambulatory Visit (INDEPENDENT_AMBULATORY_CARE_PROVIDER_SITE_OTHER): Payer: Medicare Other

## 2013-03-17 DIAGNOSIS — Z7901 Long term (current) use of anticoagulants: Secondary | ICD-10-CM | POA: Diagnosis not present

## 2013-03-17 DIAGNOSIS — I4891 Unspecified atrial fibrillation: Secondary | ICD-10-CM

## 2013-03-17 MED ORDER — WARFARIN SODIUM 5 MG PO TABS: ORAL_TABLET | ORAL | Status: DC

## 2013-03-19 ENCOUNTER — Ambulatory Visit (INDEPENDENT_AMBULATORY_CARE_PROVIDER_SITE_OTHER): Payer: Medicare Other | Admitting: Cardiovascular Disease

## 2013-03-19 ENCOUNTER — Encounter: Payer: Self-pay | Admitting: Cardiovascular Disease

## 2013-03-19 VITALS — BP 101/57 | HR 63 | Ht 62.5 in | Wt 123.1 lb

## 2013-03-19 DIAGNOSIS — I359 Nonrheumatic aortic valve disorder, unspecified: Secondary | ICD-10-CM | POA: Diagnosis not present

## 2013-03-19 DIAGNOSIS — I712 Thoracic aortic aneurysm, without rupture: Secondary | ICD-10-CM

## 2013-03-19 DIAGNOSIS — I4891 Unspecified atrial fibrillation: Secondary | ICD-10-CM | POA: Diagnosis not present

## 2013-03-19 DIAGNOSIS — I351 Nonrheumatic aortic (valve) insufficiency: Secondary | ICD-10-CM

## 2013-03-19 MED ORDER — SPIRONOLACTONE 12.5 MG HALF TABLET: 12.5000 mg | ORAL_TABLET | Freq: Every morning | ORAL | Status: DC

## 2013-03-19 MED ORDER — PROPRANOLOL HCL ER 160 MG PO CP24: 160.0000 mg | ORAL_CAPSULE | Freq: Every day | ORAL | Status: DC

## 2013-03-19 NOTE — Assessment & Plan Note (Signed)
She's stable. She has chronic atrial fibrillation. She's on Coumadin. We'll continue with her same medications for atrial fibrillation.

## 2013-03-19 NOTE — Patient Instructions (Addendum)
Your physician wants you to follow-up in: 6 months  You will receive a reminder letter in the mail two months in advance. If you don't receive a letter, please call our office to schedule the follow-up appointment.  Your physician has recommended you make the following change in your medication:  Stop minipress

## 2013-03-19 NOTE — Assessment & Plan Note (Signed)
Her blood pressure has been on the low side. We will discontinue the Prazosin.   We will taper that over the next 7 days.  She will call me if her BP increasese.
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
## 2013-04-07 ENCOUNTER — Telehealth: Payer: Self-pay | Admitting: Cardiovascular Disease

## 2013-04-07 MED ORDER — PRAZOSIN HCL 2 MG PO CAPS: 2.0000 mg | ORAL_CAPSULE | Freq: Every day | ORAL | Status: DC

## 2013-04-07 NOTE — Telephone Encounter (Signed)
New problem    Taken medication for 30 years prazosin 2 mg twice a day  - came off blood pressure medication. Every since then have not felt well. Blood pressure has high today  135/93 .

## 2013-04-07 NOTE — Telephone Encounter (Signed)
Pt to go back on minipress at half dose. Pt agreed to plan.

## 2013-04-07 NOTE — Telephone Encounter (Signed)
Since tapering off minipress pt has not felt well. Nausea/ fatigue and body ache.  Yesterday bp 140/97 p range in 70's. Pt was told I will call back this afternoon with advise. Pt agreed to plan.

## 2013-04-08 ENCOUNTER — Ambulatory Visit (INDEPENDENT_AMBULATORY_CARE_PROVIDER_SITE_OTHER): Payer: Medicare Other | Admitting: *Deleted

## 2013-04-08 DIAGNOSIS — Z7901 Long term (current) use of anticoagulants: Secondary | ICD-10-CM | POA: Diagnosis not present

## 2013-04-08 DIAGNOSIS — I4891 Unspecified atrial fibrillation: Secondary | ICD-10-CM

## 2013-04-14 DIAGNOSIS — H43399 Other vitreous opacities, unspecified eye: Secondary | ICD-10-CM | POA: Diagnosis not present

## 2013-04-14 DIAGNOSIS — H3530 Unspecified macular degeneration: Secondary | ICD-10-CM | POA: Diagnosis not present

## 2013-04-14 DIAGNOSIS — H2510 Age-related nuclear cataract, unspecified eye: Secondary | ICD-10-CM | POA: Diagnosis not present

## 2013-04-27 DIAGNOSIS — Z23 Encounter for immunization: Secondary | ICD-10-CM | POA: Diagnosis not present

## 2013-05-06 ENCOUNTER — Ambulatory Visit (INDEPENDENT_AMBULATORY_CARE_PROVIDER_SITE_OTHER): Payer: Medicare Other | Admitting: Pharmacist

## 2013-05-06 DIAGNOSIS — Z7901 Long term (current) use of anticoagulants: Secondary | ICD-10-CM | POA: Diagnosis not present

## 2013-05-06 DIAGNOSIS — I4891 Unspecified atrial fibrillation: Secondary | ICD-10-CM

## 2013-05-20 ENCOUNTER — Other Ambulatory Visit: Payer: Self-pay | Admitting: Surgery

## 2013-05-20 DIAGNOSIS — I712 Thoracic aortic aneurysm, without rupture: Secondary | ICD-10-CM | POA: Diagnosis not present

## 2013-05-21 ENCOUNTER — Other Ambulatory Visit: Payer: Self-pay

## 2013-05-21 ENCOUNTER — Encounter: Payer: Self-pay | Admitting: Cardiothoracic Surgery

## 2013-05-21 ENCOUNTER — Ambulatory Visit (INDEPENDENT_AMBULATORY_CARE_PROVIDER_SITE_OTHER): Payer: Medicare Other | Admitting: Cardiothoracic Surgery

## 2013-05-21 VITALS — BP 123/89 | HR 64 | Resp 16 | Ht 62.5 in | Wt 123.0 lb

## 2013-05-21 DIAGNOSIS — I4891 Unspecified atrial fibrillation: Secondary | ICD-10-CM | POA: Diagnosis not present

## 2013-05-21 DIAGNOSIS — Z09 Encounter for follow-up examination after completed treatment for conditions other than malignant neoplasm: Secondary | ICD-10-CM

## 2013-05-21 DIAGNOSIS — I712 Thoracic aortic aneurysm, without rupture: Secondary | ICD-10-CM

## 2013-05-21 DIAGNOSIS — I482 Chronic atrial fibrillation, unspecified: Secondary | ICD-10-CM

## 2013-05-21 NOTE — Progress Notes (Signed)
[address]       [place] [address]             [phone]          [name] [name] [facility] Medical Record #[id number] Date of Birth: [birth year]  Ronnald Nian, MD [name], MD  Chief Complaint:   PostOp Follow Up Visit #1: Endovascular repair of a descending thoracic aortic aneurysm  #2: Distal extension x1    Devices used: Proximal piece is a Gore CTAG 37 x 15. Distal extension is a Gore CTAG 40 x 15    3 days following the procedure she had taken back to the OR with question of endoleak3 this was not confirmed on aortogram with the graft was re\re ballooned   History of Present Illness:  Since last seen the patient is in no specific complaints. She notes that the dermatomal pain in the midthoracic chest she noted on her last visit has now completely disappeared. She now has pain in her right neck and right shoulder. Worse with sitting up and disappears when she's laying.    . She denies any neurologic symptoms in the upper or lower extremities. She's had no angina  She comes in today unfortunately her CT scan is not scheduled until Monday.  History  Smoking status  . Former Smoker -- 1.00 packs/day for 20 years  . Types: Cigarettes  . Quit date: 08/22/1985  Smokeless tobacco  . Never Used       Allergies  Allergen Reactions  . Atorvastatin     Muscle aches  . Amiodarone Hcl     REACTION: Nausea/Vomiting; decrease appetite; affects liver enzymes  . Aspirin   . Diltiazem Hcl     REACTION: Nausea/vomitting  . Flecainide Acetate     REACTION: Intol: nausea/horrible feeling  . Metoprolol Succinate     REACTION: Nausea/vomiting;dizziness  . Sotalol Hcl     REACTION: Nausea/vomiting    Current Outpatient Prescriptions  Medication Sig Dispense Refill  . acetaminophen (TYLENOL) 500 MG tablet Take 500 mg by mouth every 4 (four) hours as needed for pain.       . furosemide (LASIX) 20 MG tablet Take 1 tablet (20 mg  total) by mouth daily.  90 tablet  3  . prazosin (MINIPRESS) 2 MG capsule Take 1 capsule (2 mg total) by mouth at bedtime.  30 capsule  11  . Probiotic Product (PROBIOTIC DAILY PO) Take 1 capsule by mouth daily.      . propranolol ER (INDERAL LA) 160 MG SR capsule Take 1 capsule (160 mg total) by mouth at bedtime.  90 capsule  3  . spironolactone (ALDACTONE) 12.5 mg TABS tablet Take 0.5 tablets (12.5 mg total) by mouth every morning.  90 tablet  3  . warfarin (COUMADIN) 5 MG tablet Take as directed by anticoagulation clinic  30 tablet  3   No current facility-administered medications for this visit.       Physical Exam: There were no vitals taken for this visit.  General appearance: alert and cooperative Neurologic: intact Heart: irregularly irregular rhythm Lungs: clear to auscultation bilaterally Abdomen: soft, non-tender; bowel sounds normal; no masses,  no organomegaly Extremities: extremities normal, atraumatic, no cyanosis or edema and Homans sign is negative, no sign of DVT Wound: She has palpable bilateral femoral pulses without evidence of false aneurysm on exam   Diagnostic Studies & Laboratory data:  Recent Radiology Findings: No results found.    Recent Labs: Lab Results  Component Value Date   WBC 8.6 10/07/2012   HGB 10.9* 10/07/2012   HCT 32.3* 10/07/2012   PLT 150 10/07/2012   GLUCOSE 107* 10/06/2012   CHOL 204* 08/18/2012   TRIG 54.0 08/18/2012   HDL 54.80 08/18/2012   LDLDIRECT 127.6 08/18/2012   LDLCALC 100* 10/05/2010   ALT 19 09/24/2012   AST 25 09/24/2012   NA 136 10/06/2012   K 3.8 10/06/2012   CL 101 10/06/2012   CREATININE 0.73 10/06/2012   BUN 13 10/06/2012   CO2 27 10/06/2012   TSH 1.382 Test methodology is 3rd generation TSH 01/05/2009   INR 5.0 11/11/2012   INR 3.9* 11/11/2012      Assessment / Plan:     From a clinical standpoint the patient's doing well with exception of the new onset of right neck pain. She has an appointment for followup CTA  of the chest and abdomen on Monday. I will review those films after they're done. We'll plan to see her back in 6 months, and we'll correlate with Dr. Myra Gianotti.  If the chest CT shows no clear cause of her neck pain she will make appointment to see her primary care doctor for further evaluation.      [name] [name] 05/21/2013 9:43 AM

## 2013-05-22 ENCOUNTER — Encounter: Payer: Self-pay | Admitting: Surgery

## 2013-05-25 ENCOUNTER — Ambulatory Visit (INDEPENDENT_AMBULATORY_CARE_PROVIDER_SITE_OTHER): Payer: Medicare Other | Admitting: Surgery

## 2013-05-25 ENCOUNTER — Encounter: Payer: Self-pay | Admitting: Surgery

## 2013-05-25 ENCOUNTER — Ambulatory Visit
Admission: RE | Admit: 2013-05-25 | Discharge: 2013-05-25 | Disposition: A | Discharge status: Home / Self Care | Payer: Medicare Other | Source: Ambulatory Visit | Attending: Surgery | Admitting: Surgery

## 2013-05-25 VITALS — BP 150/82 | HR 58 | Ht 62.5 in | Wt 128.0 lb

## 2013-05-25 DIAGNOSIS — I712 Thoracic aortic aneurysm, without rupture: Secondary | ICD-10-CM

## 2013-05-25 DIAGNOSIS — M549 Dorsalgia, unspecified: Secondary | ICD-10-CM

## 2013-05-25 DIAGNOSIS — R9389 Abnormal findings on diagnostic imaging of other specified body structures: Secondary | ICD-10-CM | POA: Diagnosis not present

## 2013-05-25 DIAGNOSIS — Z48812 Encounter for surgical aftercare following surgery on the circulatory system: Secondary | ICD-10-CM

## 2013-05-25 MED ORDER — IOHEXOL 350 MG/ML SOLN
75.0000 mL | Freq: Once | INTRAVENOUS | Status: AC | PRN
  Administered 2013-05-25: 75 mL via INTRAVENOUS
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
## 2013-05-26 NOTE — Addendum Note (Signed)
Addended by: Sharee Pimple on: 05/26/2013 09:05 AM   Modules accepted: Orders

## 2013-06-08 ENCOUNTER — Ambulatory Visit (INDEPENDENT_AMBULATORY_CARE_PROVIDER_SITE_OTHER): Payer: Medicare Other | Admitting: Pharmacist

## 2013-06-08 DIAGNOSIS — Z7901 Long term (current) use of anticoagulants: Secondary | ICD-10-CM

## 2013-06-08 DIAGNOSIS — I4891 Unspecified atrial fibrillation: Secondary | ICD-10-CM

## 2013-06-16 DIAGNOSIS — S81809A Unspecified open wound, unspecified lower leg, initial encounter: Secondary | ICD-10-CM | POA: Diagnosis not present

## 2013-06-17 ENCOUNTER — Telehealth: Payer: Self-pay | Admitting: Pharmacist

## 2013-06-17 NOTE — Telephone Encounter (Signed)
Pt called.  She is on vacation in [place].  Last week, she had a dog scratch her and remove skin from her legs.  She went to an urgent care and was given Rx for Bactrim x 10 days and mupirocin ointment.  She started these on 11/28.  She went back to urgent care yesterday for INR check.  They called her today and stated INR was 4.5.   Pt states she is going to stop the Bactrim today- the wound does not look like it is infected but continue the ointment.  Have asked her to hold Coumadin x 2 days then resume previous dose.  She will be back in town over the weekend so made a follow up INR appt on Monday.

## 2013-06-22 ENCOUNTER — Ambulatory Visit (INDEPENDENT_AMBULATORY_CARE_PROVIDER_SITE_OTHER): Payer: Medicare Other | Admitting: *Deleted

## 2013-06-22 DIAGNOSIS — I4891 Unspecified atrial fibrillation: Secondary | ICD-10-CM | POA: Diagnosis not present

## 2013-06-22 DIAGNOSIS — Z7901 Long term (current) use of anticoagulants: Secondary | ICD-10-CM

## 2013-06-22 LAB — POCT INR: INR: 2.1

## 2013-07-12 ENCOUNTER — Ambulatory Visit (INDEPENDENT_AMBULATORY_CARE_PROVIDER_SITE_OTHER): Payer: Medicare Other | Admitting: Internal Medicine

## 2013-07-12 VITALS — BP 118/62 | HR 87 | Temp 98.1°F | Resp 16 | Ht 62.5 in | Wt 125.0 lb

## 2013-07-12 DIAGNOSIS — S8010XA Contusion of unspecified lower leg, initial encounter: Secondary | ICD-10-CM | POA: Diagnosis not present

## 2013-07-12 DIAGNOSIS — S8012XA Contusion of left lower leg, initial encounter: Secondary | ICD-10-CM

## 2013-07-12 NOTE — Progress Notes (Signed)
Subjective:    Patient ID: [name] [name], female    DOB: 1932/02/20, 79 y.o.   MRN: [id number]  HPI This chart was scribed for [facility] by Smiley Houseman, Scribe. This patient was seen in room 10 and the patient's care was started at 1:54 PM.  HPI Comments: [name] [name] is a 79 y.o. female who [facility] complaining of wound to her left lower leg medial side that occurred when she bumped her leg in the airport while she was traveling for [place].  She thought the wound was a blood blister, but promised her family she would visit the Dr.  Rock Nephew also states near the area she has a dog bite that is healing nicely-from November. Gait is not affected    Past Surgical History  Procedure Laterality Date  . Tonsillectomy    . Cystectomy      sacrum  . Thoracic aortic endovascular stent graft N/A 10/02/2012    Procedure: THORACIC AORTIC ENDOVASCULAR STENT GRAFT;  Surgeon: Nada Libman, MD;  Location: [facility] OR;  Service: Vascular;  Laterality: N/A;  Ultrasound guided  . Thoracic aortic endovascular stent graft N/A 10/05/2012    Procedure: Endovascular repair of THORACIC AORTIC ANEURYSM;  Surgeon: Nada Libman, MD;  Location: [facility] OR;  Service: Vascular;  Laterality: N/A;  Angioplasty of Aortic throasic aneurysm, x1 aortagram,  Percutaneous acess right femoral artery.    Family History  Problem Relation Age of Onset  . Aneurysm Mother 84  . Cancer Mother     breast  . Heart disease Mother   . Hypertension Mother   . Hodgkin's lymphoma Father 49  . Cancer Father     hodgkins    History   Social History  . Marital Status: Widowed    Spouse Name: N/A    Number of Children: 3  . Years of Education: N/A   Occupational History  . Holiday representative    Social History Main Topics  . Smoking status: Former Smoker -- 1.00 packs/day for 20 years    Types: Cigarettes    Quit date: 08/22/1985  . Smokeless tobacco: Never Used  . Alcohol Use: 5.4  oz/week    9 Glasses of wine per week     Comment: a glass of wine every night  . Drug Use: No  . Sexual Activity: Not on file   Other Topics Concern  . Not on file   Social History Narrative  . No narrative on file    Allergies  Allergen Reactions  . Atorvastatin     Muscle aches  . Amiodarone Hcl     REACTION: Nausea/Vomiting; decrease appetite; affects liver enzymes  . Diltiazem Hcl     REACTION: Nausea/vomitting  . Flecainide Acetate     REACTION: Intol: nausea/horrible feeling  . Metoprolol Succinate     REACTION: Nausea/vomiting;dizziness  . Sotalol Hcl     REACTION: Nausea/vomiting    Patient Active Problem List   Diagnosis Date Noted  . Aftercare following surgery of the circulatory system, NEC 11/17/2012  . Thoracic aneurysm without mention of rupture 11/17/2012  . Back pain 11/17/2012  . Long term (current) use of anticoagulants 10/23/2012  . Abdominal aneurysm without mention of rupture 09/15/2012  . Descending thoracic aortic aneurysm 08/23/2012  . Diastolic CHF 08/22/2012  . Dyspnea 08/22/2012  . Pulmonary hypertension 08/21/2012  . Hyperlipidemia 11/15/2010  . Atrial fibrillation   . Hypertension   . Aortic insufficiency   .  Mitral regurgitation   . Tricuspid regurgitation     Results for orders placed in visit on 06/22/13  POCT INR      Result Value Range   INR 2.1       Review of Systems  Constitutional: Negative for fever and chills.  HENT: Negative for congestion and rhinorrhea.   Respiratory: Negative for cough and shortness of breath.   Cardiovascular: Negative for chest pain.  Gastrointestinal: Negative for nausea, vomiting, abdominal pain and diarrhea.  Musculoskeletal: Negative for back pain, joint swelling and myalgias.  Skin: Positive for wound (left lower medial leg). Negative for color change and rash.       Objective:   Physical Exam  Nursing note and vitals reviewed. Constitutional: She is oriented to person, place, and  time. She appears well-developed and well-nourished. No distress.  HENT:  Head: Normocephalic and atraumatic.  Eyes: Conjunctivae are normal. Pupils are equal, round, and reactive to light. Right eye exhibits no discharge. Left eye exhibits no discharge.  Neck: Normal range of motion.  Cardiovascular: Normal rate.   Pulmonary/Chest: Effort normal. No respiratory distress.  Neurological: She is alert and oriented to person, place, and time.  Skin: Skin is warm and dry.  Medial aspect of the left leg has a 2 cm x 4 cm area of ecchymoses with coagulated blood under the skin Nontender No signs of infection No bony tenderness  Psychiatric: She has a normal mood and affect. Her behavior is normal.    Triage Vitals: 118/62  Pulse 87  Temp(Src) 98.1 F (36.7 C) (Oral)  Resp 16  Ht 5' 2.5" (1.588 m)  Wt 125 lb (56.7 kg)  BMI 22.48 kg/m2  SpO2 97%  DIAGNOSTIC STUDIES: Oxygen Saturation is 97% on RA, normal by my interpretation.    COORDINATION OF CARE: 1:59 PM-Patient informed of current plan of treatment and evaluation and agrees with plan.        Assessment & Plan:   I have completed the patient encounter in its entirety as documented by the scribe, with editing by me where necessary. Luke Falero P. Merla Riches, M.D.    Traumatic ecchymosis of lower leg, left, initial encounter  Reassured/keep clean

## 2013-08-03 ENCOUNTER — Ambulatory Visit (INDEPENDENT_AMBULATORY_CARE_PROVIDER_SITE_OTHER): Payer: Medicare Other | Admitting: Pharmacist

## 2013-08-03 DIAGNOSIS — Z5181 Encounter for therapeutic drug level monitoring: Secondary | ICD-10-CM

## 2013-08-03 DIAGNOSIS — Z7901 Long term (current) use of anticoagulants: Secondary | ICD-10-CM | POA: Diagnosis not present

## 2013-08-03 DIAGNOSIS — I4891 Unspecified atrial fibrillation: Secondary | ICD-10-CM | POA: Diagnosis not present

## 2013-08-03 LAB — POCT INR: INR: 2.8

## 2013-08-15 ENCOUNTER — Ambulatory Visit (INDEPENDENT_AMBULATORY_CARE_PROVIDER_SITE_OTHER): Payer: Medicare Other | Admitting: Physician Assistant

## 2013-08-15 VITALS — BP 110/70 | HR 69 | Temp 97.3°F | Resp 16 | Ht 62.5 in | Wt 127.4 lb

## 2013-08-15 DIAGNOSIS — S81809A Unspecified open wound, unspecified lower leg, initial encounter: Secondary | ICD-10-CM

## 2013-08-15 DIAGNOSIS — S81009A Unspecified open wound, unspecified knee, initial encounter: Secondary | ICD-10-CM | POA: Diagnosis not present

## 2013-08-15 DIAGNOSIS — S81802A Unspecified open wound, left lower leg, initial encounter: Secondary | ICD-10-CM

## 2013-08-15 DIAGNOSIS — S91009A Unspecified open wound, unspecified ankle, initial encounter: Secondary | ICD-10-CM | POA: Diagnosis not present
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
## 2013-08-31 ENCOUNTER — Telehealth: Payer: Self-pay | Admitting: Cardiovascular Disease

## 2013-08-31 ENCOUNTER — Telehealth: Payer: Self-pay | Admitting: Family Medicine

## 2013-08-31 NOTE — Telephone Encounter (Signed)
Pt received script from pcp.

## 2013-08-31 NOTE — Telephone Encounter (Signed)
Explain to her that the protocol she mentioned is not the standard of care. She  showed avoid eating from street vendor's, don't use ice, drink only bottled water.

## 2013-08-31 NOTE — Telephone Encounter (Signed)
Left message on vm with recommendations

## 2013-08-31 NOTE — Telephone Encounter (Signed)
New message    Patient going to [place] in a week / half .   Need an order for Cipro 250 mg twice a day . In cases she get travlers diarrhea. Unable to get through to PCP will contacted them as well.

## 2013-09-01 ENCOUNTER — Telehealth: Payer: Self-pay | Admitting: Family Medicine

## 2013-09-01 MED ORDER — CIPROFLOXACIN HCL 500 MG PO TABS
500.0000 mg | ORAL_TABLET | Freq: Two times a day (BID) | ORAL | Status: DC
Start: 2013-09-01 — End: 2013-11-19

## 2013-09-01 NOTE — Telephone Encounter (Signed)
She will be going to [place] and wants some medication in case she gets diarrhea. I will give her Cipro and give her instructions on proper use of medication. She is only to use this if she gets constitutional symptoms as well as the diarrhea

## 2013-09-09 ENCOUNTER — Other Ambulatory Visit: Payer: Self-pay | Admitting: Cardiovascular Disease

## 2013-09-09 ENCOUNTER — Ambulatory Visit (INDEPENDENT_AMBULATORY_CARE_PROVIDER_SITE_OTHER): Payer: Medicare Other | Admitting: Pharmacist

## 2013-09-09 DIAGNOSIS — Z7901 Long term (current) use of anticoagulants: Secondary | ICD-10-CM | POA: Diagnosis not present

## 2013-09-09 DIAGNOSIS — I4891 Unspecified atrial fibrillation: Secondary | ICD-10-CM

## 2013-09-09 LAB — POCT INR: INR: 2.3

## 2013-09-09 MED ORDER — WARFARIN SODIUM 5 MG PO TABS: ORAL_TABLET | ORAL | Status: DC

## 2013-10-06 ENCOUNTER — Ambulatory Visit (INDEPENDENT_AMBULATORY_CARE_PROVIDER_SITE_OTHER): Payer: Medicare Other | Admitting: Cardiovascular Disease

## 2013-10-06 ENCOUNTER — Ambulatory Visit (INDEPENDENT_AMBULATORY_CARE_PROVIDER_SITE_OTHER): Payer: Medicare Other | Admitting: Pharmacist Clinician (PhC)/ Clinical Pharmacy Specialist

## 2013-10-06 ENCOUNTER — Encounter: Payer: Self-pay | Admitting: Cardiovascular Disease

## 2013-10-06 ENCOUNTER — Other Ambulatory Visit: Payer: Self-pay | Admitting: *Deleted

## 2013-10-06 VITALS — BP 118/90 | HR 61 | Ht 62.5 in | Wt 124.0 lb

## 2013-10-06 DIAGNOSIS — I10 Essential (primary) hypertension: Secondary | ICD-10-CM

## 2013-10-06 DIAGNOSIS — I4891 Unspecified atrial fibrillation: Secondary | ICD-10-CM | POA: Diagnosis not present

## 2013-10-06 DIAGNOSIS — I2789 Other specified pulmonary heart diseases: Secondary | ICD-10-CM

## 2013-10-06 DIAGNOSIS — Z7901 Long term (current) use of anticoagulants: Secondary | ICD-10-CM | POA: Diagnosis not present

## 2013-10-06 DIAGNOSIS — E785 Hyperlipidemia, unspecified: Secondary | ICD-10-CM

## 2013-10-06 DIAGNOSIS — R0989 Other specified symptoms and signs involving the circulatory and respiratory systems: Principal | ICD-10-CM

## 2013-10-06 DIAGNOSIS — R0609 Other forms of dyspnea: Secondary | ICD-10-CM

## 2013-10-06 LAB — POCT INR: INR: 3.1

## 2013-10-06 MED ORDER — FUROSEMIDE 20 MG PO TABS: 20.0000 mg | ORAL_TABLET | Freq: Every day | ORAL | Status: DC

## 2013-10-06 MED ORDER — SPIRONOLACTONE 12.5 MG HALF TABLET: 12.5000 mg | ORAL_TABLET | Freq: Every morning | ORAL | Status: DC

## 2013-10-06 MED ORDER — PROPRANOLOL HCL ER 160 MG PO CP24: 160.0000 mg | ORAL_CAPSULE | Freq: Every day | ORAL | Status: DC

## 2013-10-06 NOTE — Assessment & Plan Note (Signed)
[name] [name] remains very short of breath. She has a history of multi-valvular disease including aortic insufficiency, mitral regurgitation, tricuspid regurgitation. She has moderate pulmonary hypertension. She has chronic atrial fibrillation that has been resistant to cardioversion. She's not respond to standard medical therapy. Insurance will not pay for Tikosyn.  She's been referred to Dr. Rayann Heman and he determined that she was not a good candidate for atrial fibrillation ablation.  She had a stress test many years ago. She had an echo about one year ago. I would like to obtain a Lexiscan myoview, repeat Echo.  I would like to get PFTS.    She'll be seeing Dr. Verneita Griffes soon and will have a chest x-ray and CT scan at that time.  I'll see her in 6 months for followup visit. Unfortunately, did not have any specific etiology to explain her gradually worsening dyspnea.

## 2013-10-06 NOTE — Patient Instructions (Signed)
Your physician has requested that you have an echocardiogram. Echocardiography is a painless test that uses sound waves to create images of your heart. It provides your doctor with information about the size and shape of your heart and how well your heart's chambers and valves are working. This procedure takes approximately one hour. There are no restrictions for this procedure.  Your physician has requested that you have a lexiscan myoview.  Please follow instruction sheet, as given.  Your physician has recommended that you have a pulmonary function test. Pulmonary Function Tests are a group of tests that measure how well air moves in and out of your lungs.   Your physician wants you to follow-up in: 6 MONTHS You will receive a reminder letter in the mail two months in advance. If you don't receive a letter, please call our office to schedule the follow-up appointment.

## 2013-10-06 NOTE — Assessment & Plan Note (Signed)
She has atrial fibrillation that has been very difficult to manage. Try cardioversion on several different occasions. Her insurance company will not pay for tikosyn.   WE have sent her to Dr. Rayann Heman but she is not a candidate for Afib ablation.    She has significant dyspnea that may be due to her atrial fibrillation her may perhaps be due to her pulmonary hypertension. Will continue with aggressive rate control.
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
## 2013-10-06 NOTE — Assessment & Plan Note (Signed)
Continue current meds 

## 2013-10-20 ENCOUNTER — Other Ambulatory Visit: Payer: Self-pay

## 2013-10-20 DIAGNOSIS — I712 Thoracic aortic aneurysm, without rupture, unspecified: Secondary | ICD-10-CM

## 2013-11-09 ENCOUNTER — Ambulatory Visit (HOSPITAL_BASED_OUTPATIENT_CLINIC_OR_DEPARTMENT_OTHER): Payer: Medicare Other | Admitting: Radiology

## 2013-11-09 ENCOUNTER — Ambulatory Visit (HOSPITAL_COMMUNITY): Payer: Medicare Other | Attending: Cardiovascular Disease | Admitting: Radiology

## 2013-11-09 VITALS — BP 157/68 | HR 84 | Ht 62.5 in | Wt 125.0 lb

## 2013-11-09 DIAGNOSIS — I4891 Unspecified atrial fibrillation: Secondary | ICD-10-CM | POA: Diagnosis not present

## 2013-11-09 DIAGNOSIS — R0602 Shortness of breath: Secondary | ICD-10-CM

## 2013-11-09 DIAGNOSIS — Z87891 Personal history of nicotine dependence: Secondary | ICD-10-CM | POA: Diagnosis not present

## 2013-11-09 DIAGNOSIS — R0609 Other forms of dyspnea: Secondary | ICD-10-CM | POA: Diagnosis not present

## 2013-11-09 DIAGNOSIS — Z8249 Family history of ischemic heart disease and other diseases of the circulatory system: Secondary | ICD-10-CM | POA: Diagnosis not present

## 2013-11-09 DIAGNOSIS — I359 Nonrheumatic aortic valve disorder, unspecified: Secondary | ICD-10-CM | POA: Diagnosis not present

## 2013-11-09 DIAGNOSIS — I10 Essential (primary) hypertension: Secondary | ICD-10-CM | POA: Insufficient documentation

## 2013-11-09 DIAGNOSIS — R0989 Other specified symptoms and signs involving the circulatory and respiratory systems: Principal | ICD-10-CM | POA: Insufficient documentation

## 2013-11-09 DIAGNOSIS — I2789 Other specified pulmonary heart diseases: Secondary | ICD-10-CM

## 2013-11-09 DIAGNOSIS — I2510 Atherosclerotic heart disease of native coronary artery without angina pectoris: Secondary | ICD-10-CM | POA: Diagnosis not present

## 2013-11-09 DIAGNOSIS — I509 Heart failure, unspecified: Secondary | ICD-10-CM | POA: Diagnosis not present

## 2013-11-09 DIAGNOSIS — R0600 Dyspnea, unspecified: Secondary | ICD-10-CM

## 2013-11-09 MED ORDER — REGADENOSON 0.4 MG/5ML IV SOLN
0.4000 mg | Freq: Once | INTRAVENOUS | Status: AC
Start: 2013-11-09 — End: 2013-11-09
  Administered 2013-11-09: 0.4 mg via INTRAVENOUS

## 2013-11-09 MED ORDER — TECHNETIUM TC 99M SESTAMIBI GENERIC - CARDIOLITE
10.0000 | Freq: Once | INTRAVENOUS | Status: AC | PRN
  Administered 2013-11-09: 10 via INTRAVENOUS

## 2013-11-09 MED ORDER — TECHNETIUM TC 99M SESTAMIBI GENERIC - CARDIOLITE
30.0000 | Freq: Once | INTRAVENOUS | Status: AC | PRN
  Administered 2013-11-09: 30 via INTRAVENOUS

## 2013-11-09 NOTE — Progress Notes (Signed)
[facility] SITE 3 NUCLEAR MED [address] [place], [place] [address] [phone]    Cardiology Nuclear Med Study  [name] [name] is a 80 y.o. female     MRN : [id number]     DOB: 11/06/1931  Procedure Date: 11/09/2013  Nuclear Med Background Indication for Stress Test:  Evaluation for Ischemia History:  CAD, Afib, CHF, Echo 2014 EF 50-55%, MPI 2010 (normal) EF 76%, repair of descending aortic aneurysm Cardiac Risk Factors: Family History - CAD, History of Smoking, Hypertension and Lipids  Symptoms:  DOE and SOB   Nuclear Pre-Procedure Caffeine/Decaff Intake:  None > 12 hrs NPO After: 7:30am   Lungs:  clear O2 Sat: 96% on room air. IV 0.9% NS with Angio Cath:  22g  IV Site: R Antecubital x 1, tolerated well IV Started by:  Irven Baltimore, RN  Chest Size (in):  38 Cup Size: B  Height: 5' 2.5" (1.588 m)  Weight:  125 lb (56.7 kg)  BMI:  Body mass index is 22.48 kg/(m^2). Tech Comments:  Patient took Inderal last night    Nuclear Med Study 1 or 2 day study: 1 day  Stress Test Type:  Lexiscan  Reading MD: N/A  Order Authorizing Provider:  Mertie Moores, MD  Resting Radionuclide: Technetium 44m Sestamibi  Resting Radionuclide Dose: 11.0 mCi   Stress Radionuclide:  Technetium 63m Sestamibi  Stress Radionuclide Dose: 33.0 mCi           Stress Protocol Rest HR: 84 Stress HR: 98  Rest BP: 157/68 Stress BP: 102/81  Exercise Time (min): n/a METS: n/a           Dose of Adenosine (mg):  n/a Dose of Lexiscan: 0.4 mg  Dose of Atropine (mg): n/a Dose of Dobutamine: n/a mcg/kg/min (at max HR)  Stress Test Technologist: Glade Lloyd, BS-ES  Nuclear Technologist:  Charlton Amor, CNMT     Rest Procedure:  Myocardial perfusion imaging was performed at rest 45 minutes following the intravenous administration of Technetium 2m Sestamibi. Rest ECG: Atrial fibrillation rate 85 with nonspecific ST-T wave changes  Stress Procedure:  The patient received IV Lexiscan 0.4 mg  over 15-seconds.  Technetium 23m Sestamibi injected at 30-seconds.  Quantitative spect images were obtained after a 45 minute delay.  During the infusion of the Lexiscan, patient complained of SOB, stomach pain, dizziness and tingly feeling.  These symptoms began to resolve in recovery.  Stress ECG: No significant change from baseline ECG  QPS Raw Data Images:  Images were obtained with arms at side. Mild breast attenuation. Stress Images:  There is very subtle distal anterior wall decreased uptake seen at both rest and stress consistent with breast attenuation. Rest Images:  There is very subtle distal anterior wall decreased uptake consistent with breast attenuation Subtraction (SDS):  No evidence of ischemia. Transient Ischemic Dilatation (Normal <1.22):  1.05 Lung/Heart Ratio (Normal <0.45):  0.31  Quantitative Gated Spect Images QGS EDV:  n/a QGS ESV:  n/a  Impression Exercise Capacity:  Lexiscan with no exercise. BP Response:  Normal blood pressure response. Clinical Symptoms:  Typical symptoms with Lexiscan, mild flushing, shortness of breath ECG Impression:  No significant ST segment change suggestive of ischemia. Comparison with Prior Nuclear Study: No images to compare  Overall Impression:  Low risk stress nuclear study with no area of significant ischemia identified.  LV Ejection Fraction: Study not gated.  LV Wall Motion:  Study not gated secondary to atrial fibrillation.   Candee Furbish,  MD

## 2013-11-09 NOTE — Progress Notes (Signed)
Echocardiogram performed.  

## 2013-11-11 ENCOUNTER — Ambulatory Visit (HOSPITAL_COMMUNITY)
Admission: RE | Admit: 2013-11-11 | Discharge: 2013-11-11 | Disposition: A | Discharge status: Home / Self Care | Payer: Medicare Other | Source: Ambulatory Visit | Attending: Cardiovascular Disease | Admitting: Cardiovascular Disease

## 2013-11-11 DIAGNOSIS — I4891 Unspecified atrial fibrillation: Secondary | ICD-10-CM | POA: Diagnosis not present

## 2013-11-11 DIAGNOSIS — R0989 Other specified symptoms and signs involving the circulatory and respiratory systems: Secondary | ICD-10-CM | POA: Diagnosis not present

## 2013-11-11 DIAGNOSIS — R0609 Other forms of dyspnea: Secondary | ICD-10-CM | POA: Insufficient documentation

## 2013-11-11 DIAGNOSIS — I2789 Other specified pulmonary heart diseases: Secondary | ICD-10-CM | POA: Insufficient documentation

## 2013-11-11 MED ORDER — ALBUTEROL SULFATE (2.5 MG/3ML) 0.083% IN NEBU
2.5000 mg | INHALATION_SOLUTION | Freq: Once | RESPIRATORY_TRACT | Status: AC
  Administered 2013-11-11: 2.5 mg via RESPIRATORY_TRACT

## 2013-11-16 ENCOUNTER — Other Ambulatory Visit: Payer: Self-pay | Admitting: Cardiothoracic Surgery

## 2013-11-16 DIAGNOSIS — I712 Thoracic aortic aneurysm, without rupture, unspecified: Secondary | ICD-10-CM | POA: Diagnosis not present

## 2013-11-16 LAB — PULMONARY FUNCTION TEST
DL/VA % PRED: 70 %
DL/VA: 3.2 ml/min/mmHg/L
DLCO unc % pred: 37 %
DLCO unc: 8.25 ml/min/mmHg
FEF 25-75 Post: 0.7 L/sec
FEF 25-75 Pre: 0.69 L/sec
FEF2575-%CHANGE-POST: 1 %
FEF2575-%PRED-POST: 52 %
FEF2575-%PRED-PRE: 51 %
FEV1-%Change-Post: 0 %
FEV1-%Pred-Post: 68 %
FEV1-%Pred-Pre: 68 %
FEV1-POST: 1.22 L
FEV1-PRE: 1.22 L
FEV1FVC-%CHANGE-POST: -7 %
FEV1FVC-%Pred-Pre: 92 %
FEV6-%CHANGE-POST: 6 %
FEV6-%PRED-POST: 82 %
FEV6-%Pred-Pre: 77 %
FEV6-Post: 1.88 L
FEV6-Pre: 1.76 L
FEV6FVC-%Change-Post: -2 %
FEV6FVC-%Pred-Post: 103 %
FEV6FVC-%Pred-Pre: 105 %
FVC-%Change-Post: 8 %
FVC-%PRED-POST: 80 %
FVC-%Pred-Pre: 73 %
FVC-POST: 1.93 L
FVC-Pre: 1.78 L
POST FEV1/FVC RATIO: 63 %
Post FEV6/FVC ratio: 97 %
Pre FEV1/FVC ratio: 69 %
Pre FEV6/FVC Ratio: 99 %
RV % pred: 121 %
RV: 2.8 L
TLC % pred: 100 %
TLC: 4.83 L

## 2013-11-17 LAB — CREATININE, SERUM: Creat: 1.15 mg/dL — ABNORMAL HIGH (ref 0.50–1.10)

## 2013-11-17 LAB — BUN: BUN: 29 mg/dL — ABNORMAL HIGH (ref 6–23)

## 2013-11-19 ENCOUNTER — Ambulatory Visit (INDEPENDENT_AMBULATORY_CARE_PROVIDER_SITE_OTHER): Payer: Medicare Other | Admitting: Cardiothoracic Surgery

## 2013-11-19 ENCOUNTER — Ambulatory Visit (INDEPENDENT_AMBULATORY_CARE_PROVIDER_SITE_OTHER): Payer: Medicare Other | Admitting: *Deleted

## 2013-11-19 ENCOUNTER — Ambulatory Visit
Admission: RE | Admit: 2013-11-19 | Discharge: 2013-11-19 | Disposition: A | Discharge status: Home / Self Care | Payer: Medicare Other | Source: Ambulatory Visit | Attending: Surgery | Admitting: Surgery

## 2013-11-19 ENCOUNTER — Encounter: Payer: Self-pay | Admitting: Cardiothoracic Surgery

## 2013-11-19 VITALS — BP 121/87 | HR 66 | Resp 16 | Ht 62.5 in | Wt 125.0 lb

## 2013-11-19 DIAGNOSIS — Z7901 Long term (current) use of anticoagulants: Secondary | ICD-10-CM | POA: Diagnosis not present

## 2013-11-19 DIAGNOSIS — I712 Thoracic aortic aneurysm, without rupture, unspecified: Secondary | ICD-10-CM

## 2013-11-19 DIAGNOSIS — Z09 Encounter for follow-up examination after completed treatment for conditions other than malignant neoplasm: Secondary | ICD-10-CM

## 2013-11-19 DIAGNOSIS — Z5181 Encounter for therapeutic drug level monitoring: Secondary | ICD-10-CM

## 2013-11-19 DIAGNOSIS — I4891 Unspecified atrial fibrillation: Secondary | ICD-10-CM

## 2013-11-19 LAB — POCT INR: INR: 3.3

## 2013-11-19 MED ORDER — IOHEXOL 350 MG/ML SOLN
80.0000 mL | Freq: Once | INTRAVENOUS | Status: AC | PRN
  Administered 2013-11-19: 80 mL via INTRAVENOUS

## 2013-11-19 NOTE — Progress Notes (Signed)
[name]       [name] [address]             [phone]             [name] [name] [facility] Record #[id number] Date of Birth: Jun 04, 1932  [name] Lung, MD [name], MD  Chief Complaint:   PostOp Follow Up Visit #1: Endovascular repair of a descending thoracic aortic aneurysm  #2: Distal extension x1    Devices used: Proximal piece is a Gore CTAG 37 x 15. Distal extension is a Gore CTAG 40 x 15    3 days following the procedure she had taken back to the OR with question of endoleak3 this was not confirmed on aortogram with the graft was re\re ballooned   History of Present Illness:  Since last seen the patient is in no specific complaints. She has had extensive cardiology workup with Myoview stress test recently. The bands of numbness that she had in her back around the time of the placement of the stent graft have completely disappeared. She does have chronic neck pain.    History  Smoking status  . Former Smoker -- 1.00 packs/day for 20 years  . Types: Cigarettes  . Quit date: 08/22/1985  Smokeless tobacco  . Never Used       Allergies  Allergen Reactions  . Atorvastatin     Muscle aches  . Amiodarone Hcl     REACTION: Nausea/Vomiting; decrease appetite; affects liver enzymes  . Diltiazem Hcl     REACTION: Nausea/vomitting  . Flecainide Acetate     REACTION: Intol: nausea/horrible feeling  . Metoprolol Succinate     REACTION: Nausea/vomiting;dizziness  . Sotalol Hcl     REACTION: Nausea/vomiting    Current Outpatient Prescriptions  Medication Sig Dispense Refill  . acetaminophen (TYLENOL) 500 MG tablet Take 500 mg by mouth every 4 (four) hours as needed for pain.       . furosemide (LASIX) 20 MG tablet Take 1 tablet (20 mg total) by mouth daily.  90 tablet  3  . prazosin (MINIPRESS) 2 MG capsule Take 1 capsule (2 mg total) by mouth at bedtime.  30 capsule  11  . Probiotic Product (PROBIOTIC DAILY  PO) Take 1 capsule by mouth daily.      . propranolol ER (INDERAL LA) 160 MG SR capsule Take 1 capsule (160 mg total) by mouth at bedtime.  90 capsule  3  . spironolactone (ALDACTONE) 12.5 mg TABS tablet Take 0.5 tablets (12.5 mg total) by mouth every morning.  90 tablet  3  . warfarin (COUMADIN) 5 MG tablet TAKE AS DIRECTED BY ANTICOAGULATION CLINIC  270 tablet  1   No current facility-administered medications for this visit.       Physical Exam: BP 121/87  Pulse 66  Resp 16  Ht 5' 2.5" (1.588 m)  Wt 125 lb (56.7 kg)  BMI 22.48 kg/m2  SpO2 97%  General appearance: alert and cooperative Neurologic: intact Heart: irregularly irregular rhythm Lungs: clear to auscultation bilaterally Abdomen: soft, non-tender; bowel sounds normal; no masses,  no organomegaly Extremities: extremities normal, atraumatic, no cyanosis or edema and Homans sign is negative, no sign of DVT Wound: She has palpable bilateral femoral pulses without evidence of false aneurysm on exam   Diagnostic Studies & Laboratory data:         Recent Radiology Findings: Ct Angio Chest Aorta W/cm &/or Wo/cm  11/19/2013  CLINICAL DATA:  Thoracic aortic aneurysm, post stent placement.  EXAM: CT ANGIOGRAPHY CHEST WITH CONTRAST  TECHNIQUE: Multidetector CT imaging of the chest was performed using the standard protocol during bolus administration of intravenous contrast. Multiplanar CT image reconstructions and MIPs were obtained to evaluate the vascular anatomy.  CONTRAST:  9mL OMNIPAQUE IOHEXOL 350 MG/ML SOLN  COMPARISON:  05/25/2013  FINDINGS: Ascending aorta measures up to 4.1 cm diameter, with scattered atheromatous plaque. Classic 3 vessel brachiocephalic arterial origin anatomy without significant proximal stenosis. Patent stent graft in the descending thoracic aorta. Native aneurysm sac measures 6.2 x 7.4 cm maximum transverse dimensions (previously 6.2 x 7.1). Persistent endoleak, not seen on arterial phase but visible on  delayed images at the anterolateral margin of the stent graft, similar in appearance to previous exam. No dissection or stenosis. The visualized proximal abdominal aorta is ectatic, stable.  No pleural or pericardial effusion. No hilar or mediastinal adenopathy. Patchy coronary calcifications. Marked biatrial enlargement. Lungs remain clear. There is reflux of contrast material from right atrium into hepatic veins. Remainder visualized upper abdomen unremarkable. Spondylitic changes in the lower cervical spine and at multiple levels in the mid and lower thoracic spine.  Review of the MIP images confirms the above findings.  IMPRESSION: 1. Patent descending thoracic aortic stent graft with persistent endoleak, slight increase in native sac diameter to 7.4 cm.   Electronically Signed   By: Arne Cleveland M.D.   On: 11/19/2013 12:54    Recent Labs: Lab Results  Component Value Date   WBC 8.6 10/07/2012   HGB 10.9* 10/07/2012   HCT 32.3* 10/07/2012   PLT 150 10/07/2012   GLUCOSE 107* 10/06/2012   CHOL 204* 08/18/2012   TRIG 54.0 08/18/2012   HDL 54.80 08/18/2012   LDLDIRECT 127.6 08/18/2012   LDLCALC 100* 10/05/2010   ALT 19 09/24/2012   AST 25 09/24/2012   NA 136 10/06/2012   K 3.8 10/06/2012   CL 101 10/06/2012   CREATININE 0.73 10/06/2012   BUN 13 10/06/2012   CO2 27 10/06/2012   TSH 1.382 Test methodology is 3rd generation TSH 01/05/2009   INR 5.0 11/11/2012   INR 3.9* 11/11/2012      Assessment / Plan:     From a clinical standpoint the patient's doing well with exception of the new onset of right neck pain. The aneurysm sac appears stable , she does have a endoleak type II versus type III with late appearance of contrast. The aneurysm sac itself appears 3 mm larger.  We'll plan on repeat scan in 6 months. We'll review the films with Dr. Trula Slade.      [name] 11/19/2013 12:23 PM

## 2013-11-26 ENCOUNTER — Telehealth: Payer: Self-pay | Admitting: Cardiovascular Disease

## 2013-11-26 DIAGNOSIS — J449 Chronic obstructive pulmonary disease, unspecified: Secondary | ICD-10-CM

## 2013-11-26 NOTE — Telephone Encounter (Signed)
I spoke with the pt and gave her the preliminary results of PFT.  I will forward this message to [facility] to follow up with the pt.

## 2013-11-26 NOTE — Telephone Encounter (Signed)
New message     Want PFT test results

## 2013-11-26 NOTE — Telephone Encounter (Signed)
Left message on machine for pt to contact the office.  PFT results are in [place] but it looks like Dr Acie Fredrickson was waiting on final interpretation of results from pulmonary.

## 2013-11-27 NOTE — Telephone Encounter (Signed)
Spoke with patient who is aware of results of PFT and referral to pulmonology.  Patient is aware she will receive a phone call to schedule appointment

## 2013-12-03 ENCOUNTER — Ambulatory Visit (INDEPENDENT_AMBULATORY_CARE_PROVIDER_SITE_OTHER): Payer: Medicare Other | Admitting: Pharmacist Clinician (PhC)/ Clinical Pharmacy Specialist

## 2013-12-03 ENCOUNTER — Ambulatory Visit: Payer: Medicare Other | Admitting: Cardiothoracic Surgery

## 2013-12-03 DIAGNOSIS — I4891 Unspecified atrial fibrillation: Secondary | ICD-10-CM

## 2013-12-03 DIAGNOSIS — Z5181 Encounter for therapeutic drug level monitoring: Secondary | ICD-10-CM

## 2013-12-03 DIAGNOSIS — Z7901 Long term (current) use of anticoagulants: Secondary | ICD-10-CM

## 2013-12-03 LAB — POCT INR: INR: 1.9

## 2013-12-08 ENCOUNTER — Encounter: Payer: Self-pay | Admitting: Internal Medicine

## 2013-12-08 ENCOUNTER — Ambulatory Visit (INDEPENDENT_AMBULATORY_CARE_PROVIDER_SITE_OTHER): Payer: Medicare Other | Admitting: Internal Medicine

## 2013-12-08 VITALS — BP 116/70 | HR 114 | Temp 98.4°F | Ht 62.0 in | Wt 124.0 lb

## 2013-12-08 DIAGNOSIS — J449 Chronic obstructive pulmonary disease, unspecified: Secondary | ICD-10-CM

## 2013-12-08 DIAGNOSIS — R0989 Other specified symptoms and signs involving the circulatory and respiratory systems: Secondary | ICD-10-CM

## 2013-12-08 DIAGNOSIS — R0600 Dyspnea, unspecified: Secondary | ICD-10-CM

## 2013-12-08 DIAGNOSIS — R0609 Other forms of dyspnea: Secondary | ICD-10-CM | POA: Diagnosis not present

## 2013-12-08 MED ORDER — TIOTROPIUM BROMIDE MONOHYDRATE 2.5 MCG/ACT IN AERS: INHALATION_SPRAY | RESPIRATORY_TRACT | Status: DC

## 2013-12-08 NOTE — Progress Notes (Signed)
Subjective:    Patient ID: [name] [name], female    DOB: Feb 18, 1932 MRN: [id number]   Brief patient profile:  53 yowf quit smoking 1987 and very active no problem at all breathing until onset afib around 2008 and never right since referred 08/22/2012 to Pulmonary clinic by [name] [name] [name] for sob.    History of Present Illness  08/22/2012 1st pulmonary cc sob aburpt onset 2008 when dx with afib,  Only better p cardioverted then relapsed, never has sob  sitting still but progress to point of doe x walking to car with legs give out too about the same time, 02 sats dropped to low 90's walking at [name] [name] office 08/21/13 rec Please see patient coordinator before you leave today  to schedule for overnight pulse oximetry with no polish on nails and we will call result Please remember to go to the  x-ray department downstairs for your tests - we will call you with the results when they are available. See if the days your breathing seems better correlate with a lower pulse before you start your activity CTa > Thoracic aneurysm  10/02/2012 - 10/05/2012  Pre-operative Diagnosis: Thoracic aneurysm with likely type III endoleak  Post-operative diagnosis: Same  Surgeon: [name] [name]  Co-Surgeon: [name] [name]  Procedure: #1. Endovascular repair of thype III endoleak (baloon angioplasty of Thoracic aortic stent graft  #2. Ultrasound guided access of right femoral artery  #3. Thoracic arotic angiogram  #4. Catheter in thoracic aorta x 1        12/08/2013 f/u ov/[name] [name] re: doe x 7 years  Chief Complaint  Patient presents with  . Follow-up    Pt last seen Feb 2014. She states DOE no better since then, but no worse. She states that she gets out of breath walking approx half a block.   walking back from mailbox stops half way - worse in hot weather, legs give out about the same time as breathing   No obvious day to day or daytime variabilty or assoc chronic cough or cp or chest tightness, subjective  wheeze overt sinus or hb symptoms. No unusual exp hx or h/o childhood pna/ asthma or knowledge of premature birth.  Sleeping ok without nocturnal  or early am exacerbation  of respiratory  c/o's or need for noct saba. Also denies any obvious fluctuation of symptoms with weather or environmental changes or other aggravating or alleviating factors except as outlined above   Current Medications, Allergies, Complete Past Medical History, Past Surgical History, Family History, and Social History were reviewed in [employer] record.  ROS  The following are not active complaints unless bolded sore throat, dysphagia, dental problems, itching, sneezing,  nasal congestion or excess/ purulent secretions, ear ache,   fever, chills, sweats, unintended wt loss, pleuritic or exertional cp, hemoptysis,  orthopnea pnd or leg swelling, presyncope, palpitations, heartburn, abdominal pain, anorexia, nausea, vomiting, diarrhea  or change in bowel or urinary habits, change in stools or urine, dysuria,hematuria,  rash, arthralgias, visual complaints, headache, numbness weakness or ataxia or problems with walking or coordination,  change in mood/affect or memory.        .        Objective:   Physical Exam  12/08/13            125  Wt Readings from Last 3 Encounters:  08/22/12 136 lb 3.2 oz (61.78 kg)  08/21/12 134 lb 12.8 oz (61.145 kg)  06/17/12 137 lb (62.143 kg)  HEENT: nl dentition, turbinates, and orophanx. Nl external ear canals without cough reflex   NECK :  without JVD/Nodes/TM/ nl carotid upstrokes bilaterally   LUNGS: no acc muscle use, clear to A and P bilaterally without cough on insp or exp maneuvers   CV:  RRR  no s3  II/VI sem and diast m also but  no edema, P 2 slt increased vs A2  ABD:  soft and nontender with nl excursion in the supine position. No bruits or organomegaly, bowel sounds nl  MS:  warm without deformities, calf tenderness, cyanosis or  clubbing  SKIN: warm and dry without lesions    NEURO:  alert, approp, no deficits     CTa  11/19/13   Patent descending thoracic aortic stent graft with persistent  endoleak, slight increase in native sac diameter to 7.4 cm.         Assessment & Plan:

## 2013-12-08 NOTE — Patient Instructions (Addendum)
Spiriva 2 pffs each am and fill the presciption if helps your activity tolerance - if not, don't fill it  Inderal may be affecting your breathing - the substitute is bisoprol 5 mg one daily   Please schedule a follow up office visit in 6 weeks, call sooner if needed

## 2013-12-09 DIAGNOSIS — J449 Chronic obstructive pulmonary disease, unspecified: Secondary | ICD-10-CM | POA: Insufficient documentation

## 2013-12-09 NOTE — Assessment & Plan Note (Signed)
-   Sats low 90's walking in office 08/21/12 - 12/08/2013  Walked RA x 3 laps @ 185 ft each stopped due to legs gave out before breathing, no desats  stongly points to deconditioning as her major limitation though she does probably have mild copd > see sep a/p

## 2013-12-09 NOTE — Assessment & Plan Note (Signed)
-   PFTs 11/11/13  fev1  1.22 (68%) ratio 69   This is very mild copd but she may have enough dynamic hyperinflation to benefit from trial of spiriva   The proper method of use, as well as anticipated side effects, of a metered-dose inhaler are discussed and demonstrated to the patient. Improved effectiveness after extensive coaching during this visit to a level of approximately  75% so will give 2 weeks sample and rec maint rx using mailbox to house tolerance to judge it.  See instructions for specific recommendations which were reviewed directly with the patient who was given a copy with highlighter outlining the key components.

## 2013-12-17 ENCOUNTER — Ambulatory Visit (INDEPENDENT_AMBULATORY_CARE_PROVIDER_SITE_OTHER): Payer: Medicare Other | Admitting: *Deleted

## 2013-12-17 DIAGNOSIS — Z5181 Encounter for therapeutic drug level monitoring: Secondary | ICD-10-CM | POA: Diagnosis not present

## 2013-12-17 DIAGNOSIS — Z7901 Long term (current) use of anticoagulants: Secondary | ICD-10-CM | POA: Diagnosis not present

## 2013-12-17 DIAGNOSIS — I4891 Unspecified atrial fibrillation: Secondary | ICD-10-CM | POA: Diagnosis not present

## 2013-12-17 LAB — POCT INR: INR: 3.2

## 2013-12-23 ENCOUNTER — Telehealth: Payer: Self-pay | Admitting: Internal Medicine

## 2013-12-23 NOTE — Telephone Encounter (Signed)
Spoke with the pt  She states that spiriva respimat helped her breathing "some, but not a whole lot"  She went to fill the rx and it was too expensive- copay 65$ She is asking if Dr Melvyn Novas wants to give an alternative  Pt has ov 01/27/14 Please advise, thanks!

## 2013-12-24 MED ORDER — TIOTROPIUM BROMIDE MONOHYDRATE 2.5 MCG/ACT IN AERS: INHALATION_SPRAY | RESPIRATORY_TRACT | Status: DC

## 2013-12-24 NOTE — Telephone Encounter (Signed)
That's actually a very good price.  She could use atrovent 2 puffs qid but it's not nearly as good a drug. My advice is to stick with the spiriva until ov then regroup

## 2013-12-24 NOTE — Telephone Encounter (Signed)
Called and spoke with pt and she stated that she could afford the $65 per month but in the next couple of months she will be in the dough nut hole and then would not be able to afford her meds.  Pt is aware of samples left up front for her to pick up and she will keep the appt with MW next month to regroup.

## 2013-12-28 ENCOUNTER — Telehealth: Payer: Self-pay | Admitting: Internal Medicine

## 2013-12-28 NOTE — Telephone Encounter (Signed)
Pt came by -- needed help with Spiriva respimat Pt did not have inhaler put together properly Pt has been shown how to put this together and inhaler checked to make sure working properly.  Nothing further needed.

## 2013-12-30 ENCOUNTER — Ambulatory Visit (INDEPENDENT_AMBULATORY_CARE_PROVIDER_SITE_OTHER): Payer: Medicare Other | Admitting: *Deleted

## 2013-12-30 DIAGNOSIS — Z5181 Encounter for therapeutic drug level monitoring: Secondary | ICD-10-CM | POA: Diagnosis not present

## 2013-12-30 DIAGNOSIS — I4891 Unspecified atrial fibrillation: Secondary | ICD-10-CM

## 2013-12-30 DIAGNOSIS — Z7901 Long term (current) use of anticoagulants: Secondary | ICD-10-CM

## 2013-12-30 LAB — POCT INR: INR: 2

## 2014-01-08 ENCOUNTER — Ambulatory Visit (INDEPENDENT_AMBULATORY_CARE_PROVIDER_SITE_OTHER): Payer: Medicare Other

## 2014-01-08 DIAGNOSIS — Z7901 Long term (current) use of anticoagulants: Secondary | ICD-10-CM

## 2014-01-08 DIAGNOSIS — I4891 Unspecified atrial fibrillation: Secondary | ICD-10-CM

## 2014-01-08 DIAGNOSIS — Z5181 Encounter for therapeutic drug level monitoring: Secondary | ICD-10-CM

## 2014-01-08 LAB — POCT INR: INR: 3.1

## 2014-01-11 ENCOUNTER — Ambulatory Visit (INDEPENDENT_AMBULATORY_CARE_PROVIDER_SITE_OTHER): Payer: Medicare Other | Admitting: *Deleted

## 2014-01-11 DIAGNOSIS — I4891 Unspecified atrial fibrillation: Secondary | ICD-10-CM | POA: Diagnosis not present

## 2014-01-11 DIAGNOSIS — Z7901 Long term (current) use of anticoagulants: Secondary | ICD-10-CM | POA: Diagnosis not present

## 2014-01-11 DIAGNOSIS — Z5181 Encounter for therapeutic drug level monitoring: Secondary | ICD-10-CM

## 2014-01-11 LAB — POCT INR: INR: 1.8

## 2014-01-26 ENCOUNTER — Ambulatory Visit (INDEPENDENT_AMBULATORY_CARE_PROVIDER_SITE_OTHER): Payer: Medicare Other | Admitting: *Deleted

## 2014-01-26 DIAGNOSIS — Z7901 Long term (current) use of anticoagulants: Secondary | ICD-10-CM | POA: Diagnosis not present

## 2014-01-26 DIAGNOSIS — I4891 Unspecified atrial fibrillation: Secondary | ICD-10-CM | POA: Diagnosis not present

## 2014-01-26 DIAGNOSIS — Z5181 Encounter for therapeutic drug level monitoring: Secondary | ICD-10-CM

## 2014-01-26 LAB — POCT INR: INR: 2.2

## 2014-01-27 ENCOUNTER — Ambulatory Visit: Payer: Medicare Other | Admitting: Internal Medicine

## 2014-01-28 ENCOUNTER — Ambulatory Visit (INDEPENDENT_AMBULATORY_CARE_PROVIDER_SITE_OTHER): Payer: Medicare Other | Admitting: Internal Medicine

## 2014-01-28 ENCOUNTER — Encounter: Payer: Self-pay | Admitting: Internal Medicine

## 2014-01-28 VITALS — BP 120/78 | HR 74 | Temp 98.4°F | Ht 62.0 in | Wt 121.2 lb

## 2014-01-28 DIAGNOSIS — I10 Essential (primary) hypertension: Secondary | ICD-10-CM

## 2014-01-28 DIAGNOSIS — R0600 Dyspnea, unspecified: Secondary | ICD-10-CM

## 2014-01-28 DIAGNOSIS — R0609 Other forms of dyspnea: Secondary | ICD-10-CM | POA: Diagnosis not present

## 2014-01-28 DIAGNOSIS — J449 Chronic obstructive pulmonary disease, unspecified: Secondary | ICD-10-CM

## 2014-01-28 DIAGNOSIS — R0989 Other specified symptoms and signs involving the circulatory and respiratory systems: Secondary | ICD-10-CM

## 2014-01-28 DIAGNOSIS — J4489 Other specified chronic obstructive pulmonary disease: Secondary | ICD-10-CM

## 2014-01-28 NOTE — Progress Notes (Signed)
Subjective:    Patient ID: [name] [name], female    DOB: 1932/03/22 MRN: [id number]   Brief patient profile:  18 yowf quit smoking 1987 and very active no problem at all breathing until onset afib around 2008 and never right since referred 08/22/2012 to Pulmonary clinic by Dr Cathie Olden for sob.    History of Present Illness  08/22/2012 1st pulmonary cc sob aburpt onset 2008 when dx with afib,  Only better p cardioverted then relapsed, never has sob  sitting still but progress to point of doe x walking to car with legs give out too about the same time, 02 sats dropped to low 90's walking at Dr Julious Payer office 08/21/13 rec Please see patient coordinator before you leave today  to schedule for overnight pulse oximetry with no polish on nails and we will call result Please remember to go to the  x-ray department downstairs for your tests - we will call you with the results when they are available. See if the days your breathing seems better correlate with a lower pulse before you start your activity CTa > Thoracic aneurysm  10/02/2012 - 10/05/2012  Pre-operative Diagnosis: Thoracic aneurysm with likely type III endoleak  Post-operative diagnosis: Same  Surgeon: Eldridge Abrahams  Co-Surgeon: Ceasar Mons  Procedure: #1. Endovascular repair of thype III endoleak (baloon angioplasty of Thoracic aortic stent graft  #2. Ultrasound guided access of right femoral artery  #3. Thoracic arotic angiogram  #4. Catheter in thoracic aorta x 1        12/08/2013 f/u ov/Wert re: doe x 7 years  Chief Complaint  Patient presents with  . Follow-up    Pt last seen Feb 2014. She states DOE no better since then, but no worse. She states that she gets out of breath walking approx half a block.   walking back from mailbox stops half way - worse in hot weather, legs give out about the same time as breathing  rec Spiriva 2 pffs each am and fill the presciption if helps your activity tolerance - if not, don't fill  it Inderal may be affecting your breathing - the substitute is bisoprol 5 mg one daily        01/28/2014 f/u ov/Wert re:  GOLD II/ still on inderal  (has been for decades) Chief Complaint  Patient presents with  . Follow-up    Breathing unchanged since last OV-Pt reports can not use spiriva causes her congestion  not much better breathing  on spiriva, caused sinus problems = stuffy nose, throat congestion Cannot name one activity she desires to do where breathing is limiting her    No obvious day to day or daytime variabilty or assoc chronic cough or cp or chest tightness, subjective wheeze overt sinus or hb symptoms. No unusual exp hx or h/o childhood pna/ asthma or knowledge of premature birth.  Sleeping ok without nocturnal  or early am exacerbation  of respiratory  c/o's or need for noct saba. Also denies any obvious fluctuation of symptoms with weather or environmental changes or other aggravating or alleviating factors except as outlined above   Current Medications, Allergies, Complete Past Medical History, Past Surgical History, Family History, and Social History were reviewed in [employer] record.  ROS  The following are not active complaints unless bolded sore throat, dysphagia, dental problems, itching, sneezing,  nasal congestion or excess/ purulent secretions, ear ache,   fever, chills, sweats, unintended wt loss, pleuritic or exertional cp, hemoptysis,  orthopnea pnd or leg swelling, presyncope, palpitations, heartburn, abdominal pain, anorexia, nausea, vomiting, diarrhea  or change in bowel or urinary habits, change in stools or urine, dysuria,hematuria,  rash, arthralgias, visual complaints, headache, numbness weakness or ataxia or problems with walking or coordination,  change in mood/affect or memory.        .        Objective:   Physical Exam  12/08/13            125 > 01/28/2014   121 Wt Readings from Last 3 Encounters:  08/22/12 136 lb 3.2  oz (61.78 kg)  08/21/12 134 lb 12.8 oz (61.145 kg)  06/17/12 137 lb (62.143 kg)     HEENT: nl dentition, turbinates, and orophanx. Nl external ear canals without cough reflex   NECK :  without JVD/Nodes/TM/ nl carotid upstrokes bilaterally   LUNGS: no acc muscle use, clear to A and P bilaterally without cough on insp or exp maneuvers   CV:  RRR  no s3  II/VI sem and diast m also but  no edema, P 2 slt increased vs A2  ABD:  soft and nontender with nl excursion in the supine position. No bruits or organomegaly, bowel sounds nl  MS:  warm without deformities, calf tenderness, cyanosis or clubbing  SKIN: warm and dry without lesions    NEURO:  alert, approp, no deficits     CTa  11/19/13  Patent descending thoracic aortic stent graft with persistent  endoleak, slight increase in native sac diameter to 7.4 cm.         Assessment & Plan:

## 2014-01-28 NOTE — Patient Instructions (Addendum)
Inderal may be affecting your breathing - the substitute is bisoprol 5 mg one daily    Return any time you fine that breathing is limiting you from what you are doing.

## 2014-01-30 NOTE — Assessment & Plan Note (Addendum)
-   PFTs 11/11/13  fev1  1.22 (68%) ratio 69  - Trial of spiriva 12/09/2013 >>> no change activity tol  As I explained to this patient in detail:  although there may be copd present, it may not be clinically relevant:   it does not appear to be limiting activity tolerance any more than a set of worn tires limits someone from driving a car  around a parking lot.  A new set of Michelins might look good but would have no perceived impact on the performance of the car and would not be worth the cost.  That is to say:   this pt is so sedentary I don't recommend aggressive pulmonary rx at this point unless limiting symptoms arise or acute exacerbations become as issue, neither of which is the case now.  I asked the patient to contact this office at any time in the future should either of these problems arise.

## 2014-01-30 NOTE — Assessment & Plan Note (Signed)
-   Sats low 90's walking in office 08/21/12 - 12/08/2013  Walked RA x 3 laps @ 185 ft each stopped due to legs gave out before breathing, no desats  prob more related to conditioning than copd based on lack of perceived benefit on spiriva >  Only other option is try off inderal in favor of more selective BB (see hbp)

## 2014-01-30 NOTE — Assessment & Plan Note (Signed)
Strongly prefer in this setting: Bystolic, the most beta -1  selective Beta blocker available in sample form, with bisoprolol the most selective generic choice  on the market.  However, pt is reuluctant to consider any change and not really sure that her airflow is limiting her from desired ativities - if it does,  Then needs trial off propranolol before adding additional  bronchodilators

## 2014-02-17 ENCOUNTER — Ambulatory Visit (INDEPENDENT_AMBULATORY_CARE_PROVIDER_SITE_OTHER): Payer: Medicare Other | Admitting: *Deleted

## 2014-02-17 DIAGNOSIS — Z7901 Long term (current) use of anticoagulants: Secondary | ICD-10-CM

## 2014-02-17 DIAGNOSIS — Z5181 Encounter for therapeutic drug level monitoring: Secondary | ICD-10-CM

## 2014-02-17 DIAGNOSIS — I4891 Unspecified atrial fibrillation: Secondary | ICD-10-CM | POA: Diagnosis not present

## 2014-02-17 LAB — POCT INR: INR: 2.4

## 2014-03-17 ENCOUNTER — Ambulatory Visit (INDEPENDENT_AMBULATORY_CARE_PROVIDER_SITE_OTHER): Payer: Medicare Other | Admitting: *Deleted

## 2014-03-17 DIAGNOSIS — I4891 Unspecified atrial fibrillation: Secondary | ICD-10-CM | POA: Diagnosis not present

## 2014-03-17 DIAGNOSIS — Z7901 Long term (current) use of anticoagulants: Secondary | ICD-10-CM | POA: Diagnosis not present

## 2014-03-17 DIAGNOSIS — Z5181 Encounter for therapeutic drug level monitoring: Secondary | ICD-10-CM

## 2014-03-17 LAB — POCT INR: INR: 2.5

## 2014-03-26 IMAGING — CR DG CHEST 2V
2 series · 2 of 2 positions shown · non-contrast
Comparison: 01/04/2009 and earlier.

CLINICAL DATA: 80-year-old female with shortness of breath, atrial
fibrillation.

CHEST - 2 VIEW

[view not recorded (1 of 2)]
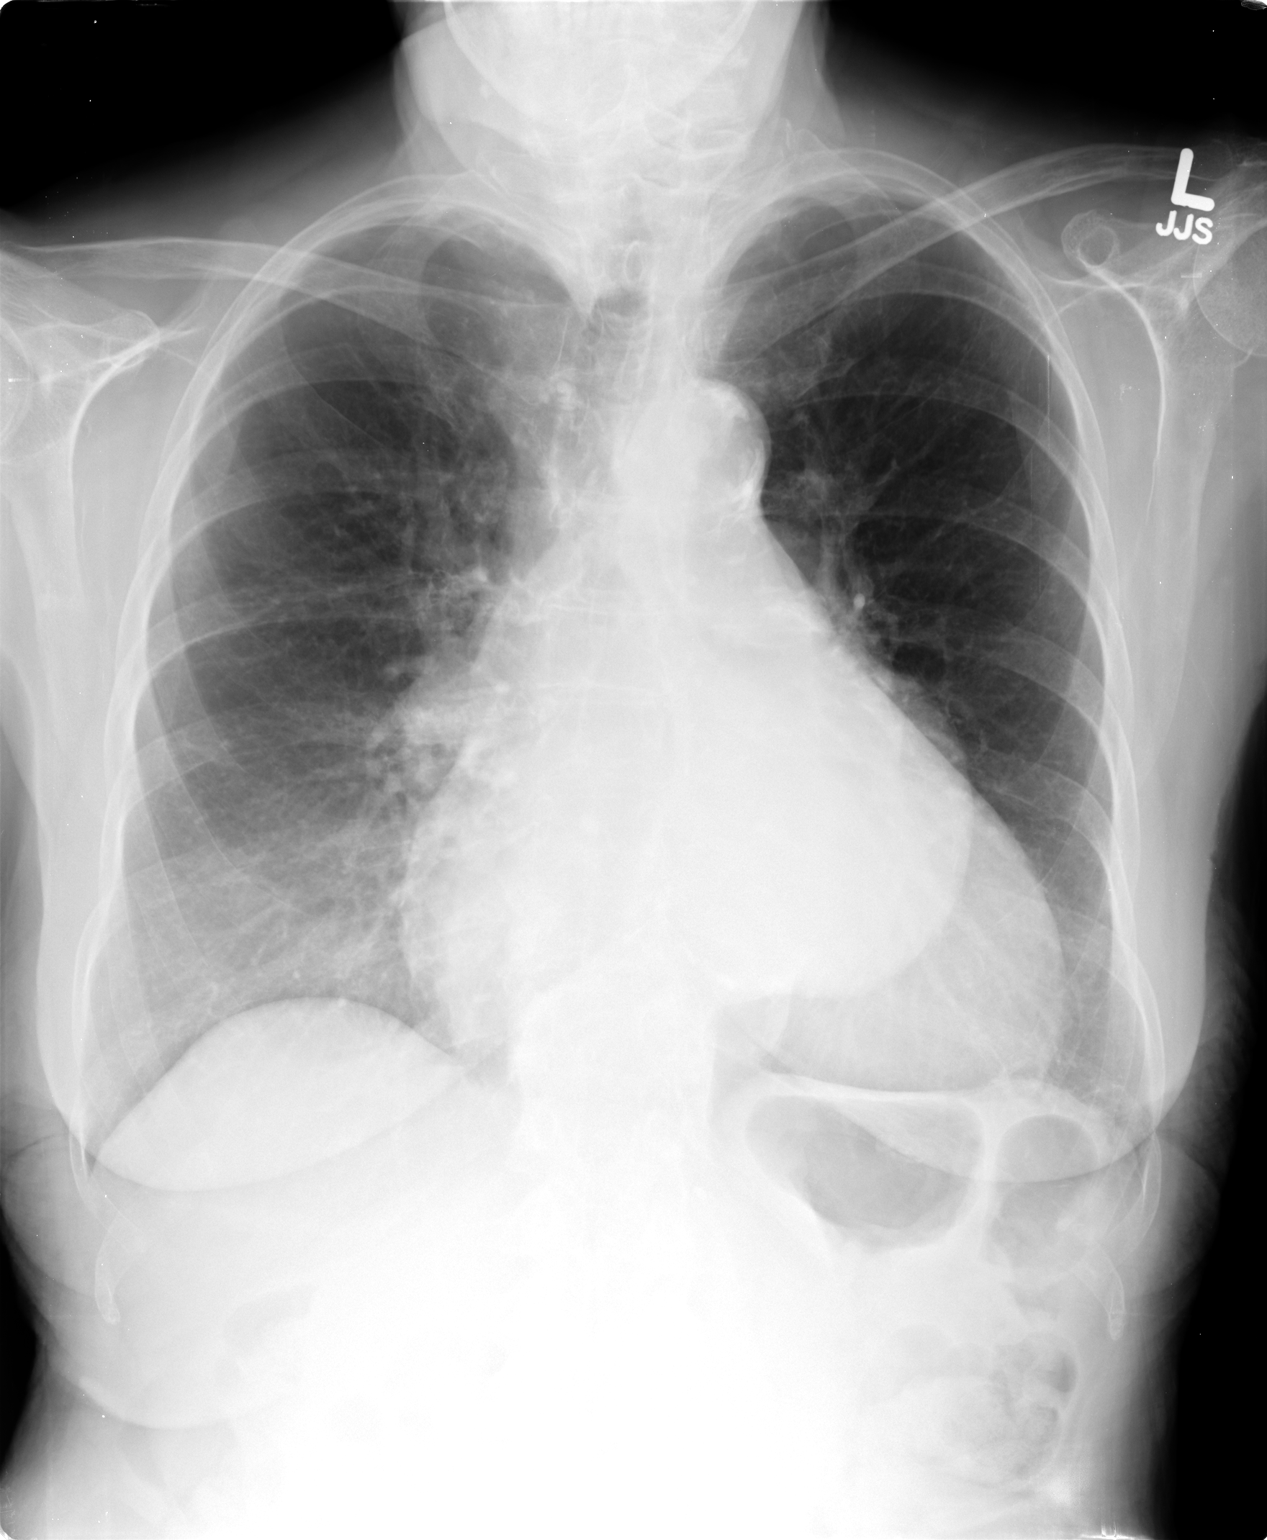

[view not recorded (2 of 2)]
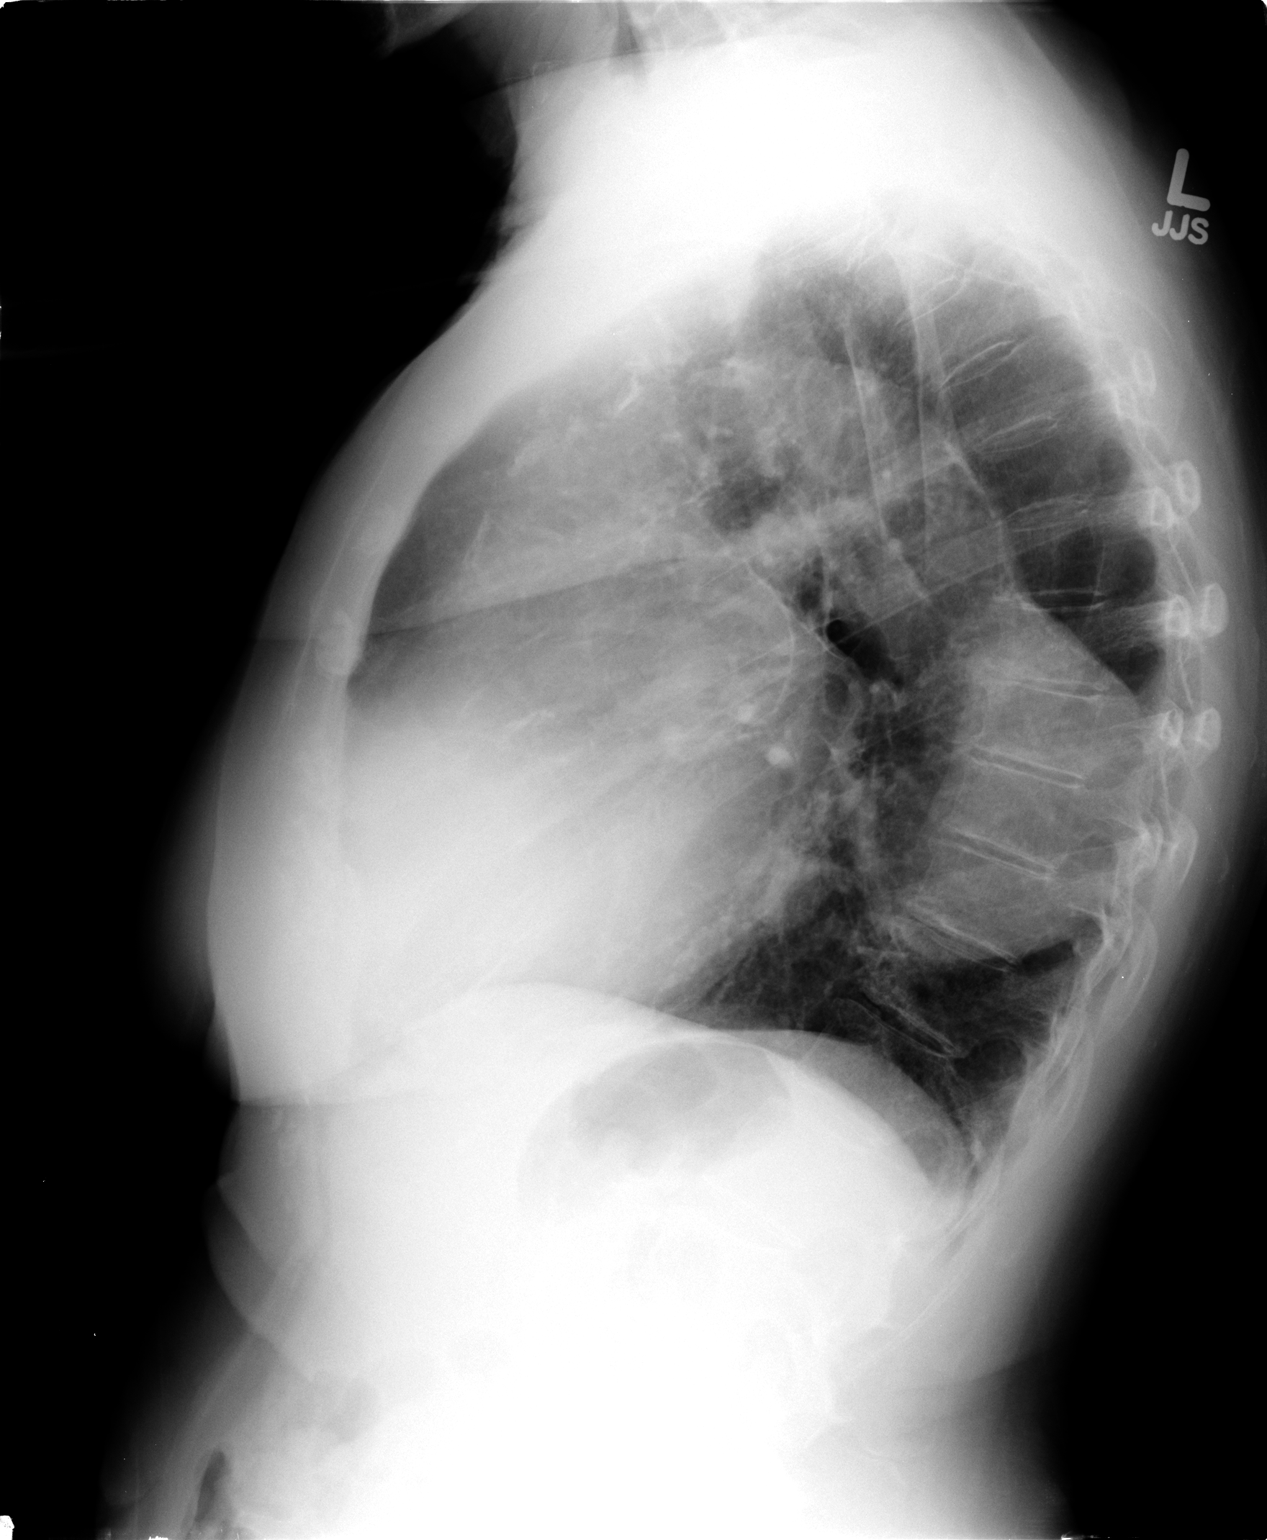

[2 of 2 positions shown; findings below may reference images not displayed]

FINDINGS: Chronic ectasia and aneurysmal enlargement of the
descending thoracic aorta has progressed since 8222.  Maximum
descending thoracic aorta diameter now as much as 8 cm
(approximately 5-6 cm in 8222).  The vessel appears aneurysmal
along the approximate 10 cm segment on the lateral view (not
significantly changed).

Chronic cardiomegaly.  Other mediastinal contours are stable.
Visualized tracheal air column is within normal limits.  No
pneumothorax, pulmonary edema, pleural effusion or acute pulmonary
opacity. Stable visualized osseous structures.
IMPRESSION: 1.  Progression of a chronic descending thoracic aortic aneurysm
since 8222, now up to 8 cm diameter (versus 5-6 cm in 8222).
2.  Stable cardiomegaly.  No acute pulmonary process identified.

Impression #1 discussed by telephone with RN Armis Cool at Dr. [REDACTED] at the time of dictation.

## 2014-04-01 ENCOUNTER — Other Ambulatory Visit: Payer: Self-pay | Admitting: Cardiovascular Disease

## 2014-04-02 ENCOUNTER — Telehealth: Payer: Self-pay | Admitting: Cardiovascular Disease

## 2014-04-02 NOTE — Telephone Encounter (Signed)
New message     Pt is a Dr Acie Fredrickson patient.  She picked up her furosemide and it had Dr Kyla Balzarine name on it.  She want to know why his name was on the presc and she has never seen him before

## 2014-04-21 ENCOUNTER — Ambulatory Visit (INDEPENDENT_AMBULATORY_CARE_PROVIDER_SITE_OTHER): Payer: Medicare Other | Admitting: *Deleted

## 2014-04-21 DIAGNOSIS — Z7901 Long term (current) use of anticoagulants: Secondary | ICD-10-CM

## 2014-04-21 DIAGNOSIS — Z5181 Encounter for therapeutic drug level monitoring: Secondary | ICD-10-CM | POA: Diagnosis not present

## 2014-04-21 DIAGNOSIS — I4891 Unspecified atrial fibrillation: Secondary | ICD-10-CM | POA: Diagnosis not present

## 2014-04-21 LAB — POCT INR: INR: 2.3

## 2014-05-04 ENCOUNTER — Other Ambulatory Visit: Payer: Self-pay | Admitting: *Deleted

## 2014-05-04 DIAGNOSIS — I712 Thoracic aortic aneurysm, without rupture: Secondary | ICD-10-CM | POA: Diagnosis not present

## 2014-05-04 DIAGNOSIS — I7123 Aneurysm of the descending thoracic aorta, without rupture: Secondary | ICD-10-CM

## 2014-05-10 DIAGNOSIS — Z23 Encounter for immunization: Secondary | ICD-10-CM | POA: Diagnosis not present

## 2014-06-02 ENCOUNTER — Ambulatory Visit (INDEPENDENT_AMBULATORY_CARE_PROVIDER_SITE_OTHER): Payer: Medicare Other | Admitting: Pharmacist

## 2014-06-02 DIAGNOSIS — Z7901 Long term (current) use of anticoagulants: Secondary | ICD-10-CM

## 2014-06-02 DIAGNOSIS — I4891 Unspecified atrial fibrillation: Secondary | ICD-10-CM

## 2014-06-02 DIAGNOSIS — Z5181 Encounter for therapeutic drug level monitoring: Secondary | ICD-10-CM

## 2014-06-02 LAB — POCT INR: INR: 1.9

## 2014-06-14 DIAGNOSIS — I712 Thoracic aortic aneurysm, without rupture: Secondary | ICD-10-CM | POA: Diagnosis not present

## 2014-06-14 LAB — BUN: BUN: 27 mg/dL — ABNORMAL HIGH (ref 6–23)

## 2014-06-16 LAB — CREATININE, SERUM: Creat: 1.4 mg/dL — ABNORMAL HIGH (ref 0.50–1.10)

## 2014-06-17 ENCOUNTER — Ambulatory Visit
Admission: RE | Admit: 2014-06-17 | Discharge: 2014-06-17 | Disposition: A | Discharge status: Home / Self Care | Payer: Medicare Other | Source: Ambulatory Visit | Attending: Cardiothoracic Surgery | Admitting: Cardiothoracic Surgery

## 2014-06-17 ENCOUNTER — Ambulatory Visit (INDEPENDENT_AMBULATORY_CARE_PROVIDER_SITE_OTHER): Payer: Medicare Other | Admitting: Cardiothoracic Surgery

## 2014-06-17 ENCOUNTER — Encounter: Payer: Self-pay | Admitting: Cardiothoracic Surgery

## 2014-06-17 VITALS — BP 140/85 | HR 80 | Resp 20 | Ht 62.0 in | Wt 124.0 lb

## 2014-06-17 DIAGNOSIS — R0602 Shortness of breath: Secondary | ICD-10-CM | POA: Diagnosis not present

## 2014-06-17 DIAGNOSIS — I712 Thoracic aortic aneurysm, without rupture, unspecified: Secondary | ICD-10-CM

## 2014-06-17 DIAGNOSIS — I7123 Aneurysm of the descending thoracic aorta, without rupture: Secondary | ICD-10-CM

## 2014-06-17 MED ORDER — IOHEXOL 350 MG/ML SOLN
80.0000 mL | Freq: Once | INTRAVENOUS | Status: AC | PRN
  Administered 2014-06-17: 80 mL via INTRAVENOUS

## 2014-06-17 NOTE — Progress Notes (Signed)
[address]       [place] [address]             [id number]             [place] [facility] Medical Record #[id number] Date of Birth: July 04, 1932  Serafina Mitchell, MD [name], MD  Chief Complaint:   PostOp Follow Up Visit #1: Endovascular repair of a descending thoracic aortic aneurysm  #2: Distal extension x1    Devices used: Proximal piece is a Gore CTAG 37 x 15. Distal extension is a Gore CTAG 40 x 15    3 days following the procedure she had taken back to the OR with question of endoleak3 this was not confirmed on aortogram with the graft was re\re ballooned   History of Present Illness:  Since last seen the patient is in no specific complaints. . The bands of numbness that she had in her back around the time of the placement of the stent graft have completely disappeared. She does have chronic neck pain., back and leg pain. She notes dyspnea on exertion.   History  Smoking status  . Former Smoker -- 1.00 packs/day for 20 years  . Types: Cigarettes  . Quit date: 08/22/1985  Smokeless tobacco  . Never Used       Allergies  Allergen Reactions  . Atorvastatin     Muscle aches  . Amiodarone Hcl     REACTION: Nausea/Vomiting; decrease appetite; affects liver enzymes  . Diltiazem Hcl     REACTION: Nausea/vomitting  . Flecainide Acetate     REACTION: Intol: nausea/horrible feeling  . Metoprolol Succinate     REACTION: Nausea/vomiting;dizziness  . Sotalol Hcl     REACTION: Nausea/vomiting    Current Outpatient Prescriptions  Medication Sig Dispense Refill  . acetaminophen (TYLENOL) 500 MG tablet Take 500 mg by mouth every 4 (four) hours as needed for pain.     . furosemide (LASIX) 20 MG tablet Take 1 tablet (20 mg total) by mouth daily. 90 tablet 3  . prazosin (MINIPRESS) 2 MG capsule Take 1 capsule (2 mg total) by mouth at bedtime. 30 capsule 11  . Probiotic Product (PROBIOTIC DAILY PO) Take 1 capsule by mouth  daily.    . propranolol ER (INDERAL LA) 160 MG SR capsule Take 1 capsule (160 mg total) by mouth at bedtime. 90 capsule 3  . spironolactone (ALDACTONE) 25 MG tablet TAKE ONE HALF TABLET BY MOUTH EVERY MORNING (Patient taking differently: TAKE ONE Quarter TABLET BY MOUTH EVERY MORNING) 90 tablet 0  . warfarin (COUMADIN) 5 MG tablet TAKE AS DIRECTED BY ANTICOAGULATION CLINIC 270 tablet 1   No current facility-administered medications for this visit.       Physical Exam: BP 140/85 mmHg  Pulse 80  Resp 20  Ht 5\' 2"  (1.575 m)  Wt 124 lb (56.246 kg)  BMI 22.67 kg/m2  SpO2 98%  General appearance: alert and cooperative Neurologic: intact Heart: irregularly irregular rhythm Lungs: clear to auscultation bilaterally Abdomen: soft, non-tender; bowel sounds normal; no masses,  no organomegaly Extremities: extremities normal, atraumatic, no cyanosis or edema and Homans sign is negative, no sign of DVT Wound: She has palpable bilateral femoral pulses without evidence of false aneurysm on exam   Diagnostic Studies & Laboratory data:         Recent Radiology Findings: Ct Angio Chest Aorta W/cm &/or Wo/cm  06/17/2014   CLINICAL DATA:  Aortic graft placement,  shortness of Breath  EXAM: CT ANGIOGRAPHY CHEST WITH CONTRAST  TECHNIQUE: Multidetector CT imaging of the chest was performed using the standard protocol during bolus administration of intravenous contrast. Multiplanar CT image reconstructions and MIPs were obtained to evaluate the vascular anatomy.  CONTRAST:  8mL OMNIPAQUE IOHEXOL 350 MG/ML SOLN  COMPARISON:  11/19/2013  FINDINGS: The non contrast scan shows scattered coronary calcifications. Ascending aortic aneurysm measures up to 4.4 cm transverse diameter, previously 4.1 cm. Stable descending thoracic aortic aneurysm, 7.4 by 6.3 cm (previously 7.4 x 6.2).  CTA shows Biatrial enlargement. Some contrast reflux from right atrium into hepatic veins. Dilated main pulmonary artery, 3.6 cm  diameter, right pulmonary artery at 3.5 cm. Satisfactory opacification of pulmonary arteries noted, and there is no evidence of pulmonary emboli. Patent superior and inferior pulmonary veins bilaterally. No aortic dissection or stenosis. Classic 3 vessel brachiocephalic arterial anatomy without proximal stenosis.  Stent graft in the descending thoracic aorta remains patent. Good apposition of the proximal tines of the stent graft. There is a persistent endoleak if best appreciated on delayed images, at the left antral lateral margin of the stent graft. Incomplete apposition of distal tines to the medial wall of the distal descending thoracic aorta , stable. The visualized proximal abdominal aorta is ectatic up to 3 cm diameter.  No pleural or pericardial effusion. Minimal linear scarring or subsegmental atelectasis at the left lung base. Minimal spurring in the lower cervical and mid thoracic spine. Sternum intact. Visualized portions of upper abdomen unremarkable.  Review of the MIP images confirms the above findings.  IMPRESSION: 1. Patent descending thoracic aortic stent graft with persistent endoleak but no change in diameter of the 7.4 cm descending thoracic aortic aneurysm.   Electronically Signed   By: Arne Cleveland M.D.   On: 06/17/2014 11:48  Ct Angio Chest Aorta W/cm &/or Wo/cm  11/19/2013   CLINICAL DATA:  Thoracic aortic aneurysm, post stent placement.  EXAM: CT ANGIOGRAPHY CHEST WITH CONTRAST  TECHNIQUE: Multidetector CT imaging of the chest was performed using the standard protocol during bolus administration of intravenous contrast. Multiplanar CT image reconstructions and MIPs were obtained to evaluate the vascular anatomy.  CONTRAST:  48mL OMNIPAQUE IOHEXOL 350 MG/ML SOLN  COMPARISON:  05/25/2013  FINDINGS: Ascending aorta measures up to 4.1 cm diameter, with scattered atheromatous plaque. Classic 3 vessel brachiocephalic arterial origin anatomy without significant proximal stenosis. Patent stent  graft in the descending thoracic aorta. Native aneurysm sac measures 6.2 x 7.4 cm maximum transverse dimensions (previously 6.2 x 7.1). Persistent endoleak, not seen on arterial phase but visible on delayed images at the anterolateral margin of the stent graft, similar in appearance to previous exam. No dissection or stenosis. The visualized proximal abdominal aorta is ectatic, stable.  No pleural or pericardial effusion. No hilar or mediastinal adenopathy. Patchy coronary calcifications. Marked biatrial enlargement. Lungs remain clear. There is reflux of contrast material from right atrium into hepatic veins. Remainder visualized upper abdomen unremarkable. Spondylitic changes in the lower cervical spine and at multiple levels in the mid and lower thoracic spine.  Review of the MIP images confirms the above findings.  IMPRESSION: 1. Patent descending thoracic aortic stent graft with persistent endoleak, slight increase in native sac diameter to 7.4 cm.   Electronically Signed   By: Arne Cleveland M.D.   On: 11/19/2013 12:54    Recent Labs: Lab Results  Component Value Date   WBC 8.6 10/07/2012   HGB 10.9* 10/07/2012   HCT 32.3* 10/07/2012  PLT 150 10/07/2012   GLUCOSE 107* 10/06/2012   CHOL 204* 08/18/2012   TRIG 54.0 08/18/2012   HDL 54.80 08/18/2012   LDLDIRECT 127.6 08/18/2012   LDLCALC 100* 10/05/2010   ALT 19 09/24/2012   AST 25 09/24/2012   NA 136 10/06/2012   K 3.8 10/06/2012   CL 101 10/06/2012   CREATININE 0.73 10/06/2012   BUN 13 10/06/2012   CO2 27 10/06/2012   TSH 1.382 Test methodology is 3rd generation TSH 01/05/2009   INR 5.0 11/11/2012   INR 3.9* 11/11/2012      Assessment / Plan:    Patent descending thoracic aortic stent graft with persistent endoleak but no change in diameter of the 7.4 cm descending thoracic aortic aneurysm. Question of type 3 endo leak appears less prominent than on scan 18 months ago  Slight enlargement of ascending aorta, still less than 4.3 cm We'll plan repeat CT  scan 6 months   Rayvon Dakin B 06/17/2014 2:12 PM

## 2014-06-21 ENCOUNTER — Other Ambulatory Visit: Payer: Self-pay

## 2014-06-21 MED ORDER — PRAZOSIN HCL 2 MG PO CAPS: 2.0000 mg | ORAL_CAPSULE | Freq: Every day | ORAL | Status: DC

## 2014-06-30 ENCOUNTER — Ambulatory Visit (INDEPENDENT_AMBULATORY_CARE_PROVIDER_SITE_OTHER): Payer: Medicare Other | Admitting: *Deleted

## 2014-06-30 DIAGNOSIS — I4891 Unspecified atrial fibrillation: Secondary | ICD-10-CM

## 2014-06-30 DIAGNOSIS — Z7901 Long term (current) use of anticoagulants: Secondary | ICD-10-CM | POA: Diagnosis not present

## 2014-06-30 DIAGNOSIS — Z5181 Encounter for therapeutic drug level monitoring: Secondary | ICD-10-CM | POA: Diagnosis not present

## 2014-06-30 LAB — POCT INR: INR: 2

## 2014-07-28 ENCOUNTER — Ambulatory Visit (INDEPENDENT_AMBULATORY_CARE_PROVIDER_SITE_OTHER): Payer: Medicare Other | Admitting: *Deleted

## 2014-07-28 DIAGNOSIS — Z7901 Long term (current) use of anticoagulants: Secondary | ICD-10-CM

## 2014-07-28 DIAGNOSIS — I4891 Unspecified atrial fibrillation: Secondary | ICD-10-CM

## 2014-07-28 DIAGNOSIS — Z5181 Encounter for therapeutic drug level monitoring: Secondary | ICD-10-CM | POA: Diagnosis not present

## 2014-07-28 LAB — POCT INR: INR: 2.4

## 2014-08-16 DIAGNOSIS — H25813 Combined forms of age-related cataract, bilateral: Secondary | ICD-10-CM | POA: Diagnosis not present

## 2014-08-20 ENCOUNTER — Telehealth: Payer: Self-pay | Admitting: Internal Medicine

## 2014-08-20 NOTE — Telephone Encounter (Signed)
Pt would like to get a back orthosis from [employer]

## 2014-08-24 NOTE — Progress Notes (Signed)
Cardiology Office Note   Date:  08/24/2014   ID:  [name] [name], DOB 01-05-1932, MRN [id number]  PCP:  [name], MD  Cardiologist:   Thayer Headings, MD   Chief Complaint  Patient presents with  . Follow-up    atrial fib   Problem List:  1. Atrial fibrillation-we have tried her on Rythmol, flecainide, Sotalol, and amiodarone. Her insurance company will not pay for [name]. 2. Hypertension 3. Thoracic aortic aneurism Servando Snare) - s/p stent graft repair. Complicated by a type III endoleak. Repaired with another stenting.  4. Pulmonary Hypertension - moderate , est. PA pressures of 55.   August 25, 2014   [name] [name] is a 81 y.o. female who presents for her atrial fib.  Same symptoms.  Still short of breath.  Has lots of fatigue with any exercise. She continues to have Afib.  Has tried multiple meds.  Has had lots of back pain - since her thoracic aortic surgery .  She gets very fatigued with any sort of walking or exercise   Past Medical History  Diagnosis Date  . AF (atrial fibrillation)   . Hypertension   . Aortic insufficiency   . Mitral regurgitation   . Tricuspid regurgitation   . CHF (congestive heart failure)   . Aortic aneurysm   . CAD (coronary artery disease)   . Arthritis   . Claustrophobia   . Shortness of breath   . Broken neck     40 years ago    Past Surgical History  Procedure Laterality Date  . Tonsillectomy    . Cystectomy      sacrum  . Thoracic aortic endovascular stent graft N/A 10/02/2012    Procedure: THORACIC AORTIC ENDOVASCULAR STENT GRAFT;  Surgeon: Serafina Mitchell, MD;  Location: [facility];  Service: Vascular;  Laterality: N/A;  Ultrasound guided  . Thoracic aortic endovascular stent graft N/A 10/05/2012    Procedure: Endovascular repair of THORACIC AORTIC ANEURYSM;  Surgeon: Serafina Mitchell, MD;  Location[facility];  Service: Vascular;  Laterality: N/A;  Angioplasty of Aortic throasic aneurysm, x1 aortagram,   Percutaneous acess right femoral artery.     Current Outpatient Prescriptions  Medication Sig Dispense Refill  . acetaminophen (TYLENOL) 500 MG tablet Take 500 mg by mouth every 4 (four) hours as needed for pain.     . furosemide (LASIX) 20 MG tablet Take 1 tablet (20 mg total) by mouth daily. 90 tablet 3  . prazosin (MINIPRESS) 2 MG capsule Take 1 capsule (2 mg total) by mouth at bedtime. 30 capsule 11  . Probiotic Product (PROBIOTIC DAILY PO) Take 1 capsule by mouth daily.    . propranolol ER (INDERAL LA) 160 MG SR capsule Take 1 capsule (160 mg total) by mouth at bedtime. 90 capsule 3  . spironolactone (ALDACTONE) 25 MG tablet TAKE ONE HALF TABLET BY MOUTH EVERY MORNING (Patient taking differently: TAKE ONE Quarter TABLET BY MOUTH EVERY MORNING) 90 tablet 0  . warfarin (COUMADIN) 5 MG tablet TAKE AS DIRECTED BY ANTICOAGULATION CLINIC 270 tablet 1   No current facility-administered medications for this visit.    Allergies:   Atorvastatin; Amiodarone hcl; Diltiazem hcl; Flecainide acetate; Metoprolol succinate; and Sotalol hcl    Social History:  The patient  reports that she quit smoking about 29 years ago. Her smoking use included Cigarettes. She has a 20 pack-year smoking history. She has never used smokeless tobacco. She reports that she drinks about 5.4 oz of  alcohol per week. She reports that she does not use illicit drugs.   Family History:  The patient's family history includes Aneurysm (age of onset: 13) in her mother; Cancer in her father and mother; Heart disease in her mother; Hodgkin's lymphoma (age of onset: 41) in her father; Hypertension in her mother.    ROS:  Please see the history of present illness.    Review of Systems: Constitutional:  denies fever, chills, diaphoresis, appetite change and fatigue.  HEENT: denies photophobia, eye pain, redness, hearing loss, ear pain, congestion, sore throat, rhinorrhea, sneezing, neck pain, neck stiffness and tinnitus.    Respiratory: denies SOB, DOE, cough, chest tightness, and wheezing.  Cardiovascular: denies chest pain, palpitations and leg swelling.  Gastrointestinal: denies nausea, vomiting, abdominal pain, diarrhea, constipation, blood in stool.  Genitourinary: denies dysuria, urgency, frequency, hematuria, flank pain and difficulty urinating.  Musculoskeletal: denies  myalgias, back pain, joint swelling, arthralgias and gait problem.   Skin: denies pallor, rash and wound.  Neurological: denies dizziness, seizures, syncope, weakness, light-headedness, numbness and headaches.   Hematological: denies adenopathy, easy bruising, personal or family bleeding history.  Psychiatric/ Behavioral: denies suicidal ideation, mood changes, confusion, nervousness, sleep disturbance and agitation.       All other systems are reviewed and negative.    PHYSICAL EXAM: VS:  There were no vitals taken for this visit. , BMI There is no weight on file to calculate BMI. GEN: Well nourished, well developed, in no acute distress HEENT: normal Neck: no JVD, carotid bruits, or masses Cardiac: irreg. Irreg. ; 2/6 diastolic murmur ,  ,no edema  Respiratory:  clear to auscultation bilaterally, normal work of breathing GI: soft, nontender, nondistended, + BS MS: no deformity or atrophy Skin: warm and dry, no rash Neuro:  Strength and sensation are intact Psych: normal   EKG:  EKG is ordered today. The ekg ordered today demonstrates atrial fib with rate of 79.    Recent Labs: 05/04/2014: Creatinine 1.40* 06/14/2014: BUN 27*    Lipid Panel    Component Value Date/Time   CHOL 204* 08/18/2012 1011   TRIG 54.0 08/18/2012 1011   HDL 54.80 08/18/2012 1011   CHOLHDL 4 08/18/2012 1011   VLDL 10.8 08/18/2012 1011   LDLCALC 100* 10/05/2010 0902   LDLDIRECT 127.6 08/18/2012 1011      Wt Readings from Last 3 Encounters:  06/17/14 124 lb (56.246 kg)  01/28/14 121 lb 3.2 oz (54.976 kg)  12/08/13 124 lb (56.246 kg)       Other studies Reviewed: Additional studies/ records that were reviewed today include: . Review of the above records demonstrates:    ASSESSMENT AND PLAN:  1. Atrial fibrillation-we have tried her on Rythmol, flecainide, Sotalol, and amiodarone. Her insurance company will not pay for [name].  Her rate is well controlled. Continue current meds.   2. Hypertension - she has been on her BP meds for years.  Will continue   3. Thoracic aortic aneurism Servando Snare) - s/p stent graft repair. Complicated by a type III endoleak. Repaired with another stenting.  She continues to have some back pain .  Will discuss with Dr. Servando Snare at next apt.    4. Pulmonary Hypertension - moderate , est. PA pressures of 55.  Advised her to limit her salt    Current medicines are reviewed at length with the patient today.  The patient does not have concerns regarding medicines.  The following changes have been made:  no change   Disposition:  FU with me in 6 months .     Signed, Nahser, Wonda Cheng, MD  08/24/2014 8:37 PM    [address], [place],   [address] Phone: [phone]; Fax: [phone]

## 2014-08-25 ENCOUNTER — Encounter: Payer: Self-pay | Admitting: Cardiovascular Disease

## 2014-08-25 ENCOUNTER — Ambulatory Visit (INDEPENDENT_AMBULATORY_CARE_PROVIDER_SITE_OTHER): Payer: Medicare Other | Admitting: *Deleted

## 2014-08-25 ENCOUNTER — Ambulatory Visit (INDEPENDENT_AMBULATORY_CARE_PROVIDER_SITE_OTHER): Payer: Medicare Other | Admitting: Cardiovascular Disease

## 2014-08-25 VITALS — BP 130/72 | HR 79 | Ht 62.5 in | Wt 121.0 lb

## 2014-08-25 DIAGNOSIS — I10 Essential (primary) hypertension: Secondary | ICD-10-CM | POA: Diagnosis not present

## 2014-08-25 DIAGNOSIS — I4891 Unspecified atrial fibrillation: Secondary | ICD-10-CM

## 2014-08-25 DIAGNOSIS — I351 Nonrheumatic aortic (valve) insufficiency: Secondary | ICD-10-CM

## 2014-08-25 DIAGNOSIS — I712 Thoracic aortic aneurysm, without rupture, unspecified: Secondary | ICD-10-CM

## 2014-08-25 DIAGNOSIS — Z5181 Encounter for therapeutic drug level monitoring: Secondary | ICD-10-CM

## 2014-08-25 DIAGNOSIS — Z7901 Long term (current) use of anticoagulants: Secondary | ICD-10-CM | POA: Diagnosis not present

## 2014-08-25 DIAGNOSIS — I482 Chronic atrial fibrillation, unspecified: Secondary | ICD-10-CM

## 2014-08-25 DIAGNOSIS — E785 Hyperlipidemia, unspecified: Secondary | ICD-10-CM | POA: Diagnosis not present

## 2014-08-25 LAB — POCT INR: INR: 2.3

## 2014-08-25 MED ORDER — PROPRANOLOL HCL ER 160 MG PO CP24: 160.0000 mg | ORAL_CAPSULE | Freq: Every day | ORAL | Status: DC

## 2014-08-25 MED ORDER — SPIRONOLACTONE 25 MG PO TABS: ORAL_TABLET | ORAL | Status: DC

## 2014-08-25 MED ORDER — WARFARIN SODIUM 5 MG PO TABS: ORAL_TABLET | ORAL | Status: DC

## 2014-08-25 MED ORDER — FUROSEMIDE 20 MG PO TABS: 20.0000 mg | ORAL_TABLET | Freq: Every day | ORAL | Status: DC

## 2014-08-25 NOTE — Patient Instructions (Signed)
Your physician recommends that you continue on your current medications as directed. Please refer to the Current Medication list given to you today.  Your physician wants you to follow-up in: 6 months with Dr. Nahser.  You will receive a reminder letter in the mail two months in advance. If you don't receive a letter, please call our office to schedule the follow-up appointment.  

## 2014-08-28 ENCOUNTER — Other Ambulatory Visit: Payer: Self-pay | Admitting: Cardiovascular Disease

## 2014-09-07 ENCOUNTER — Other Ambulatory Visit: Payer: Self-pay | Admitting: Family Medicine

## 2014-09-07 ENCOUNTER — Ambulatory Visit (INDEPENDENT_AMBULATORY_CARE_PROVIDER_SITE_OTHER): Payer: Medicare Other | Admitting: Family Medicine

## 2014-09-07 ENCOUNTER — Encounter: Payer: Self-pay | Admitting: Family Medicine

## 2014-09-07 VITALS — BP 118/70 | HR 76 | Ht 62.0 in | Wt 120.0 lb

## 2014-09-07 DIAGNOSIS — Z23 Encounter for immunization: Secondary | ICD-10-CM

## 2014-09-07 DIAGNOSIS — I340 Nonrheumatic mitral (valve) insufficiency: Secondary | ICD-10-CM | POA: Diagnosis not present

## 2014-09-07 DIAGNOSIS — H269 Unspecified cataract: Secondary | ICD-10-CM

## 2014-09-07 DIAGNOSIS — I482 Chronic atrial fibrillation, unspecified: Secondary | ICD-10-CM

## 2014-09-07 DIAGNOSIS — S43002A Unspecified subluxation of left shoulder joint, initial encounter: Secondary | ICD-10-CM

## 2014-09-07 DIAGNOSIS — M79604 Pain in right leg: Secondary | ICD-10-CM

## 2014-09-07 DIAGNOSIS — I10 Essential (primary) hypertension: Secondary | ICD-10-CM | POA: Diagnosis not present

## 2014-09-07 DIAGNOSIS — I071 Rheumatic tricuspid insufficiency: Secondary | ICD-10-CM

## 2014-09-07 DIAGNOSIS — Z7901 Long term (current) use of anticoagulants: Secondary | ICD-10-CM | POA: Diagnosis not present

## 2014-09-07 DIAGNOSIS — I5032 Chronic diastolic (congestive) heart failure: Secondary | ICD-10-CM

## 2014-09-07 DIAGNOSIS — I7123 Aneurysm of the descending thoracic aorta, without rupture: Secondary | ICD-10-CM

## 2014-09-07 DIAGNOSIS — M79605 Pain in left leg: Secondary | ICD-10-CM

## 2014-09-07 DIAGNOSIS — E785 Hyperlipidemia, unspecified: Secondary | ICD-10-CM

## 2014-09-07 DIAGNOSIS — I4891 Unspecified atrial fibrillation: Secondary | ICD-10-CM | POA: Diagnosis not present

## 2014-09-07 DIAGNOSIS — I712 Thoracic aortic aneurysm, without rupture: Secondary | ICD-10-CM

## 2014-09-07 DIAGNOSIS — M545 Low back pain: Secondary | ICD-10-CM

## 2014-09-07 DIAGNOSIS — R7989 Other specified abnormal findings of blood chemistry: Secondary | ICD-10-CM | POA: Diagnosis not present

## 2014-09-07 LAB — LIPID PANEL
Cholesterol: 204 mg/dL — ABNORMAL HIGH (ref 0–200)
HDL: 55 mg/dL (ref >=46)
LDL Cholesterol: 136 mg/dL — ABNORMAL HIGH (ref 0–99)
TRIGLYCERIDES: 64 mg/dL (ref <150)
Total CHOL/HDL Ratio: 3.7 Ratio
VLDL: 13 mg/dL (ref 0–40)

## 2014-09-07 LAB — CBC WITH DIFFERENTIAL/PLATELET
Basophils Absolute: 0.1 10*3/uL (ref 0.0–0.1)
Basophils Relative: 1 % (ref 0–1)
EOS PCT: 2 % (ref 0–5)
Eosinophils Absolute: 0.1 10*3/uL (ref 0.0–0.7)
HCT: 42.4 % (ref 36.0–46.0)
HEMOGLOBIN: 14.5 g/dL (ref 12.0–15.0)
LYMPHS ABS: 1.1 10*3/uL (ref 0.7–4.0)
Lymphocytes Relative: 19 % (ref 12–46)
MCH: 33.2 pg (ref 26.0–34.0)
MCHC: 34.2 g/dL (ref 30.0–36.0)
MCV: 97 fL (ref 78.0–100.0)
MONO ABS: 0.6 10*3/uL (ref 0.1–1.0)
MPV: 9.2 fL (ref 8.6–12.4)
Monocytes Relative: 10 % (ref 3–12)
Neutro Abs: 3.9 10*3/uL (ref 1.7–7.7)
Neutrophils Relative %: 68 % (ref 43–77)
Platelets: 217 10*3/uL (ref 150–400)
RBC: 4.37 MIL/uL (ref 3.87–5.11)
RDW: 14.3 % (ref 11.5–15.5)
WBC: 5.7 10*3/uL (ref 4.0–10.5)

## 2014-09-07 LAB — COMPREHENSIVE METABOLIC PANEL
ALT: 17 U/L (ref 0–35)
AST: 20 U/L (ref 0–37)
Albumin: 4.4 g/dL (ref 3.5–5.2)
Alkaline Phosphatase: 221 U/L — ABNORMAL HIGH (ref 39–117)
BUN: 34 mg/dL — ABNORMAL HIGH (ref 6–23)
CALCIUM: 9.8 mg/dL (ref 8.4–10.5)
CO2: 25 mEq/L (ref 19–32)
CREATININE: 1.18 mg/dL — AB (ref 0.50–1.10)
Chloride: 103 mEq/L (ref 96–112)
Glucose, Bld: 113 mg/dL — ABNORMAL HIGH (ref 70–99)
POTASSIUM: 4.4 meq/L (ref 3.5–5.3)
Sodium: 138 mEq/L (ref 135–145)
Total Bilirubin: 1.3 mg/dL — ABNORMAL HIGH (ref 0.2–1.2)
Total Protein: 7.2 g/dL (ref 6.0–8.3)

## 2014-09-07 NOTE — Progress Notes (Signed)
   Subjective:    Patient ID: [name] [name], female    DOB: 1932-02-04, 81 y.o.   MRN: [id number]  HPI She is here for an interval evaluation. She does have a history of chronic atrial fibrillation and presently is on Coumadin and she is very comfortable continuing on this regimen in spite of being advised that there are other regimens available. She has an underlying history of tricuspid and mitral regurgitation with CHF. She has a history of thoracic aneurysm repair with a sleeve. She does complain of bilateral leg pain especially with physical activities. The pain goes away within minutes after she stops activity and then recurs if she starts walking again. She has had a previous ABI which was normal. She also continues to complain of low back pain but does continue with her exercises. She is scheduled for cataract surgery. She did have a pneumonia shot approximately 8 years ago. She also complains of left shoulder pain. She states that she felt as if the shoulder when out and she was able to place it back but she is continuing to have pain with that. She continues on medications listed in the chart.   Review of Systems  All other systems reviewed and are negative.      Objective:   Physical Exam Alert and in no distress. Tympanic membranes and canals are normal. Pharyngeal area is normal. Neck is supple without adenopathy or thyromegaly. Cardiac exam shows an irregular  rhythm without murmurs or gallops. Lungs are clear to auscultation. Pain on motion of the shoulder especially with abduction and external rotation. No crepitus noted. No laxity noted. Abdominal exam shows no hepatosplenomegaly masses or tenderness.        Assessment & Plan:  Chronic atrial fibrillation - Plan: CBC with Differential/Platelet, Comprehensive metabolic panel  Long term current use of anticoagulant therapy  Tricuspid regurgitation  Mitral regurgitation  Bilateral leg pain - Plan: Lower Extremity Arterial  Doppler Bilateral  Need for prophylactic vaccination against Streptococcus pneumoniae (pneumococcus) - Plan: Pneumococcal conjugate vaccine 13-valent  Shoulder subluxation, left, initial encounter - Plan: Ambulatory referral to Physical Therapy  Essential hypertension - Plan: CBC with Differential/Platelet, Comprehensive metabolic panel  Chronic diastolic congestive heart failure  Descending thoracic aortic aneurysm  Low back pain without sciatica, unspecified back pain laterality  Hyperlipidemia - Plan: Lipid panel  Cataract  recommend continuing with her exercises and on her present medication regimen. Refer for arterial Doppler as her symptoms do sound like PVD. Prescription for Zostavax given. She will discuss this with her pharmacist. Refer her to physical therapy explained that she did not probably dislocated but sublux her shoulder. Explained that physical therapy would be the best way to handle this and if no improvement possible referral to surgery.

## 2014-09-08 ENCOUNTER — Other Ambulatory Visit: Payer: Self-pay

## 2014-09-08 DIAGNOSIS — R748 Abnormal levels of other serum enzymes: Secondary | ICD-10-CM

## 2014-09-08 LAB — GAMMA GT: GGT: 571 U/L — AB (ref 7–51)

## 2014-09-08 NOTE — Progress Notes (Signed)
Quick Note:  Do an ultrasound on her liver ______

## 2014-09-16 ENCOUNTER — Ambulatory Visit (HOSPITAL_COMMUNITY)
Admission: RE | Admit: 2014-09-16 | Discharge: 2014-09-16 | Disposition: A | Discharge status: Home / Self Care | Payer: Medicare Other | Source: Ambulatory Visit | Attending: Family Medicine | Admitting: Family Medicine

## 2014-09-16 DIAGNOSIS — R945 Abnormal results of liver function studies: Secondary | ICD-10-CM | POA: Insufficient documentation

## 2014-09-16 DIAGNOSIS — R748 Abnormal levels of other serum enzymes: Secondary | ICD-10-CM

## 2014-09-16 DIAGNOSIS — K8020 Calculus of gallbladder without cholecystitis without obstruction: Secondary | ICD-10-CM | POA: Diagnosis not present

## 2014-09-16 DIAGNOSIS — I700 Atherosclerosis of aorta: Secondary | ICD-10-CM | POA: Diagnosis not present

## 2014-09-17 ENCOUNTER — Emergency Department (HOSPITAL_COMMUNITY): Payer: Medicare Other

## 2014-09-17 ENCOUNTER — Encounter (HOSPITAL_COMMUNITY): Payer: Self-pay

## 2014-09-17 ENCOUNTER — Emergency Department (HOSPITAL_COMMUNITY)
Admission: EM | Admit: 2014-09-17 | Discharge: 2014-09-18 | Disposition: A | Discharge status: Home / Self Care | Payer: Medicare Other | Attending: Emergency Medicine | Admitting: Emergency Medicine

## 2014-09-17 DIAGNOSIS — Z87891 Personal history of nicotine dependence: Secondary | ICD-10-CM | POA: Diagnosis not present

## 2014-09-17 DIAGNOSIS — Z87828 Personal history of other (healed) physical injury and trauma: Secondary | ICD-10-CM | POA: Diagnosis not present

## 2014-09-17 DIAGNOSIS — M1990 Unspecified osteoarthritis, unspecified site: Secondary | ICD-10-CM | POA: Diagnosis not present

## 2014-09-17 DIAGNOSIS — Z7901 Long term (current) use of anticoagulants: Secondary | ICD-10-CM | POA: Insufficient documentation

## 2014-09-17 DIAGNOSIS — Y998 Other external cause status: Secondary | ICD-10-CM | POA: Insufficient documentation

## 2014-09-17 DIAGNOSIS — Y9389 Activity, other specified: Secondary | ICD-10-CM | POA: Diagnosis not present

## 2014-09-17 DIAGNOSIS — W08XXXA Fall from other furniture, initial encounter: Secondary | ICD-10-CM | POA: Insufficient documentation

## 2014-09-17 DIAGNOSIS — I10 Essential (primary) hypertension: Secondary | ICD-10-CM | POA: Diagnosis not present

## 2014-09-17 DIAGNOSIS — W19XXXA Unspecified fall, initial encounter: Secondary | ICD-10-CM

## 2014-09-17 DIAGNOSIS — I2510 Atherosclerotic heart disease of native coronary artery without angina pectoris: Secondary | ICD-10-CM | POA: Diagnosis not present

## 2014-09-17 DIAGNOSIS — S199XXA Unspecified injury of neck, initial encounter: Secondary | ICD-10-CM | POA: Insufficient documentation

## 2014-09-17 DIAGNOSIS — S0512XA Contusion of eyeball and orbital tissues, left eye, initial encounter: Secondary | ICD-10-CM | POA: Diagnosis not present

## 2014-09-17 DIAGNOSIS — I4891 Unspecified atrial fibrillation: Secondary | ICD-10-CM | POA: Diagnosis not present

## 2014-09-17 DIAGNOSIS — I509 Heart failure, unspecified: Secondary | ICD-10-CM | POA: Insufficient documentation

## 2014-09-17 DIAGNOSIS — M4302 Spondylolysis, cervical region: Secondary | ICD-10-CM | POA: Diagnosis not present

## 2014-09-17 DIAGNOSIS — S6992XA Unspecified injury of left wrist, hand and finger(s), initial encounter: Secondary | ICD-10-CM | POA: Diagnosis not present

## 2014-09-17 DIAGNOSIS — Y9289 Other specified places as the place of occurrence of the external cause: Secondary | ICD-10-CM | POA: Insufficient documentation

## 2014-09-17 DIAGNOSIS — S0012XA Contusion of left eyelid and periocular area, initial encounter: Secondary | ICD-10-CM | POA: Diagnosis not present

## 2014-09-17 DIAGNOSIS — Z79899 Other long term (current) drug therapy: Secondary | ICD-10-CM | POA: Diagnosis not present

## 2014-09-17 DIAGNOSIS — S0990XA Unspecified injury of head, initial encounter: Secondary | ICD-10-CM | POA: Diagnosis not present

## 2014-09-17 DIAGNOSIS — S199XXD Unspecified injury of neck, subsequent encounter: Secondary | ICD-10-CM | POA: Diagnosis not present

## 2014-09-17 DIAGNOSIS — M542 Cervicalgia: Secondary | ICD-10-CM

## 2014-09-17 DIAGNOSIS — S1091XA Abrasion of unspecified part of neck, initial encounter: Secondary | ICD-10-CM | POA: Diagnosis not present

## 2014-09-17 DIAGNOSIS — Z8659 Personal history of other mental and behavioral disorders: Secondary | ICD-10-CM | POA: Diagnosis not present

## 2014-09-17 DIAGNOSIS — M7989 Other specified soft tissue disorders: Secondary | ICD-10-CM | POA: Diagnosis not present

## 2014-09-17 DIAGNOSIS — S60222A Contusion of left hand, initial encounter: Secondary | ICD-10-CM | POA: Diagnosis not present

## 2014-09-17 NOTE — ED Notes (Signed)
Patient transported to CT 

## 2014-09-17 NOTE — ED Notes (Signed)
Pt presents with c/o fall that occurred approx 30 minutes ago. Pt reports she tripped and fell face forward on the floor. Pt denies any LOC. Pt does have a small facial laceration to her right cheek bone and some blood pooling and swelling in her left hand. Pt is alert and oriented. Pt is on blood thinners.

## 2014-09-17 NOTE — Progress Notes (Signed)
CSW met with pt at bedside. Friend/Neighbor was present. Pt confirms that she presents to [facility] due to falling today. Pt stated " I just plain tripped".   Pt states that she lives home alone in [place]. Pt states that she is not interested in a facility at this time. Also, pt denies the need for extra help at home. Pt states that she has not fallen within the past 6 months. She states that this is her first time falling.  Pt states that she has a good support within her Friend/Neigbor. Pt states that they look out for each other. Pt states that she does not have any questions at this time.  Friend/Neighbor Sonia Baller Caponi [phone]  Willette Brace [phone] ED CSW 09/17/2014 10:05 PM

## 2014-09-17 NOTE — ED Notes (Signed)
Abrasion to left upper cheek cleaned. No active bleeding at this time.

## 2014-09-17 NOTE — ED Notes (Signed)
Ice pack given to pt to place on hand

## 2014-09-18 ENCOUNTER — Emergency Department (HOSPITAL_COMMUNITY): Payer: Medicare Other

## 2014-09-18 ENCOUNTER — Emergency Department (HOSPITAL_COMMUNITY)
Admission: EM | Admit: 2014-09-18 | Discharge: 2014-09-18 | Disposition: A | Discharge status: Home / Self Care | Payer: Medicare Other | Attending: Emergency Medicine | Admitting: Emergency Medicine

## 2014-09-18 ENCOUNTER — Encounter (HOSPITAL_COMMUNITY): Payer: Self-pay | Admitting: Emergency Medicine

## 2014-09-18 DIAGNOSIS — Z7901 Long term (current) use of anticoagulants: Secondary | ICD-10-CM | POA: Insufficient documentation

## 2014-09-18 DIAGNOSIS — I2510 Atherosclerotic heart disease of native coronary artery without angina pectoris: Secondary | ICD-10-CM | POA: Insufficient documentation

## 2014-09-18 DIAGNOSIS — I10 Essential (primary) hypertension: Secondary | ICD-10-CM | POA: Insufficient documentation

## 2014-09-18 DIAGNOSIS — M4302 Spondylolysis, cervical region: Secondary | ICD-10-CM | POA: Diagnosis not present

## 2014-09-18 DIAGNOSIS — Z87891 Personal history of nicotine dependence: Secondary | ICD-10-CM | POA: Insufficient documentation

## 2014-09-18 DIAGNOSIS — M1990 Unspecified osteoarthritis, unspecified site: Secondary | ICD-10-CM | POA: Insufficient documentation

## 2014-09-18 DIAGNOSIS — Z79899 Other long term (current) drug therapy: Secondary | ICD-10-CM | POA: Diagnosis not present

## 2014-09-18 DIAGNOSIS — W1830XD Fall on same level, unspecified, subsequent encounter: Secondary | ICD-10-CM | POA: Insufficient documentation

## 2014-09-18 DIAGNOSIS — S199XXD Unspecified injury of neck, subsequent encounter: Secondary | ICD-10-CM | POA: Diagnosis not present

## 2014-09-18 DIAGNOSIS — I4891 Unspecified atrial fibrillation: Secondary | ICD-10-CM | POA: Diagnosis not present

## 2014-09-18 DIAGNOSIS — W19XXXD Unspecified fall, subsequent encounter: Secondary | ICD-10-CM

## 2014-09-18 DIAGNOSIS — Z8781 Personal history of (healed) traumatic fracture: Secondary | ICD-10-CM | POA: Insufficient documentation

## 2014-09-18 DIAGNOSIS — I509 Heart failure, unspecified: Secondary | ICD-10-CM | POA: Insufficient documentation

## 2014-09-18 DIAGNOSIS — Z8659 Personal history of other mental and behavioral disorders: Secondary | ICD-10-CM | POA: Diagnosis not present

## 2014-09-18 DIAGNOSIS — S199XXA Unspecified injury of neck, initial encounter: Secondary | ICD-10-CM | POA: Diagnosis not present

## 2014-09-18 MED ORDER — ACETAMINOPHEN 500 MG PO TABS
1000.0000 mg | ORAL_TABLET | Freq: Once | ORAL | Status: AC
  Administered 2014-09-18: 1000 mg via ORAL
  Filled 2014-09-18: qty 2

## 2014-09-18 NOTE — ED Provider Notes (Signed)
CSN: [id number]     Arrival date & time 09/17/14  2017 History   First MD Initiated Contact with Patient 09/17/14 2312     Chief Complaint  Patient presents with  . Fall  . Hand Injury  . Facial Laceration      The history is provided by the patient.   patient presents the emergency department after fall today when getting up from a couch.  She is on Coumadin.  She states that she struck her left head.  She is having headache at this time as well as some mild right neck pain.  She denies weakness of her arms or legs.  She also reports pain and hematoma to her dorsum of her left hand.  No other injury.  Ambulatory.  Alert and oriented 3.  No nausea or vomiting.    Past Medical History  Diagnosis Date  . AF (atrial fibrillation)   . Hypertension   . Aortic insufficiency   . Mitral regurgitation   . Tricuspid regurgitation   . CHF (congestive heart failure)   . Aortic aneurysm   . CAD (coronary artery disease)   . Arthritis   . Claustrophobia   . Shortness of breath   . Broken neck     40 years ago   Past Surgical History  Procedure Laterality Date  . Tonsillectomy    . Cystectomy      sacrum  . Thoracic aortic endovascular stent graft N/A 10/02/2012    Procedure: THORACIC AORTIC ENDOVASCULAR STENT GRAFT;  Surgeon: Serafina Mitchell, MD;  Location: [facility];  Service: Vascular;  Laterality: N/A;  Ultrasound guided  . Thoracic aortic endovascular stent graft N/A 10/05/2012    Procedure: Endovascular repair of THORACIC AORTIC ANEURYSM;  Surgeon: Serafina Mitchell, MD;  Location: [facility];  Service: Vascular;  Laterality: N/A;  Angioplasty of Aortic throasic aneurysm, x1 aortagram,  Percutaneous acess right femoral artery.   Family History  Problem Relation Age of Onset  . Aneurysm Mother 39  . Cancer Mother     breast  . Heart disease Mother   . Hypertension Mother   . Hodgkin's lymphoma Father 70  . Cancer Father     hodgkins   History  Substance Use Topics  . Smoking status:  Former Smoker -- 1.00 packs/day for 20 years    Types: Cigarettes    Quit date: 08/22/1985  . Smokeless tobacco: Never Used  . Alcohol Use: 5.4 oz/week    9 Glasses of wine per week     Comment: a glass of wine every night   OB History    No data available     Review of Systems  All other systems reviewed and are negative.     Allergies  Atorvastatin; Amiodarone hcl; Diltiazem hcl; Flecainide acetate; Metoprolol succinate; and Sotalol hcl  Home Medications   Prior to Admission medications   Medication Sig Start Date End Date Taking? Authorizing Provider  acetaminophen (TYLENOL) 500 MG tablet Take 500 mg by mouth every 4 (four) hours as needed for pain.    Yes Historical Provider, MD  furosemide (LASIX) 20 MG tablet Take 1 tablet (20 mg total) by mouth daily. 08/25/14 10/10/15 Yes Thayer Headings, MD  prazosin (MINIPRESS) 2 MG capsule Take 1 capsule (2 mg total) by mouth at bedtime. 06/21/14  Yes Thayer Headings, MD  propranolol ER (INDERAL LA) 160 MG SR capsule Take 1 capsule (160 mg total) by mouth at bedtime. 08/25/14  Yes Wonda Cheng  Nahser, MD  spironolactone (ALDACTONE) 25 MG tablet TAKE ONE HALF TABLET BY MOUTH EVERY MORNING Patient taking differently: Take 12.5 mg by mouth daily. TAKE ONE HALF TABLET BY MOUTH EVERY MORNING 08/25/14  Yes Thayer Headings, MD  warfarin (COUMADIN) 5 MG tablet Take 2.5-5 mg by mouth daily. Take 0.5 tablet (2.5mg ) on all days except Wednesday take 1 tablet (5mg )   Yes Historical Provider, MD  warfarin (COUMADIN) 5 MG tablet TAKE AS DIRECTED Patient not taking: Reported on 09/17/2014 08/30/14   Lendon Colonel, NP   BP 125/85 mmHg  Pulse 92  Temp(Src) 97.4 F (36.3 C) (Oral)  Resp 18  SpO2 96% Physical Exam  Constitutional: She is oriented to person, place, and time. She appears well-developed and well-nourished. No distress.  HENT:  Head: Normocephalic.  Small abrasion neck and ecchymosis to the left lateral periorbital region.  Extraocular  movements are intact.  No trismus or malocclusion.  Dentition is normal.  Eyes: EOM are normal.  Neck: Neck supple.  Mild cervical and paracervical tenderness without cervical step-offs.  Cardiovascular: Normal rate and intact distal pulses.   Pulmonary/Chest: Effort normal and breath sounds normal.  Abdominal: Soft. She exhibits no distension. There is no tenderness.  Musculoskeletal:  Hematoma to the dorsum of the left hand overlying the fourth and fifth metacarpals.  Full range of motion of left hand and left wrist.  Full range of motion of bilateral elbows and shoulders.  Full range of motion of bilateral hips.  Ambulatory.  No thoracic or lumbar tenderness  Neurological: She is alert and oriented to person, place, and time.  Skin: Skin is warm and dry.  Psychiatric: She has a normal mood and affect. Judgment normal.  Nursing note and vitals reviewed.   ED Course  Procedures (including critical care time) Labs Review Labs Reviewed - No data to display  Imaging Review Ct Head Wo Contrast  09/17/2014   CLINICAL DATA:  Fall today with left periorbital hematoma. Initial encounter.  EXAM: CT HEAD WITHOUT CONTRAST  TECHNIQUE: Contiguous axial images were obtained from the base of the skull through the vertex without intravenous contrast.  COMPARISON:  None.  FINDINGS: Skull and Sinuses:Negative for fracture or destructive process. The mastoids, middle ears, and imaged paranasal sinuses are clear.  Orbits: No acute abnormality.  Brain: No evidence of acute infarction, hemorrhage, hydrocephalus, or mass lesion/mass effect. Generalized brain atrophy with ex vacuo ventricular enlargement. Moderate chronic small vessel disease with ischemic gliosis throughout the bilateral cerebral white matter.  IMPRESSION: 1. No acute intracranial injury. 2. Brain atrophy and chronic small vessel disease.   Electronically Signed   By: Monte Fantasia M.D.   On: 09/17/2014 22:05   US Abdomen Complete  09/16/2014    CLINICAL DATA:  Elevated liver function tests  EXAM: ULTRASOUND ABDOMEN COMPLETE  COMPARISON:  CT scan of the abdomen and pelvis of Nov 17, 2012  FINDINGS: Gallbladder: The gallbladder is adequately distended. There are multiple linear layering stones. There is no gallbladder wall thickening, pericholecystic fluid, or positive sonographic Murphy's sign.  Common bile duct: Diameter: 1.5 mm  Liver: The liver exhibits normal echotexture with no focal mass nor ductal dilation.  IVC: No abnormality visualized.  Pancreas: Visualized portion unremarkable.  Spleen: Size and appearance within normal limits.  Right Kidney: Length: 9.5 cm. There is thinning of the cortex of the right kidney. The echotexture remains slightly lower than that of the adjacent liver. There is no hydronephrosis.  Left Kidney: Length: 10.2  cm. There is thinning of the cortex diffusely. There is no hydronephrosis.  Abdominal aorta: There is known atherosclerotic disease of the thoracic and abdominal aorta with a stent graft in the thoracic aorta. There is mural plaque and thrombus. The maximal measured diameter is 3 cm which current proximally.  Other findings: There is no ascites.  IMPRESSION: 1. Gallstones without evidence of acute cholecystitis. No acute hepatobiliary abnormality is demonstrated. 2. Renal cortical thinning bilaterally without significant increased cortical echotexture. There is no hydronephrosis. 3. Atherosclerotic changes of the abdominal aorta.   Electronically Signed   By: David  Martinique   On: 09/16/2014 10:49   Dg Hand Complete Left  09/17/2014   CLINICAL DATA:  Fall with swelling of the left hand. Initial encounter.  EXAM: LEFT HAND - COMPLETE 3+ VIEW  COMPARISON:  None.  FINDINGS: Prominent soft tissue swelling over the dorsal hand, mainly at the level of the MCP joints. There is no underlying fracture or dislocation.  Profound osteopenia.  Moderate first CMC osteoarthritis with joint narrowing and spurring. There is also  diffuse interphalangeal and MCP joint narrowing.  IMPRESSION: No acute osseous findings.   Electronically Signed   By: Monte Fantasia M.D.   On: 09/17/2014 21:24  I personally reviewed the imaging tests through PACS system I reviewed available ER/hospitalization records through the EMR    EKG Interpretation None      MDM   Final diagnoses:  Fall  Neck pain    Head CT negative.  X-ray of her hand is negative as well.  These were ordered prior to my evaluation.  I ordered a CT cervical spine however I was unaware the patient left prior to this being completed.  She stated that she needed to return home with her neighbor who is assisting her.  I personally called the patient on her cell phone and told her that she needed imaging of her neck and recommended that she come back to the emergency department for evaluation.  She states that she may come back in the morning to have this completed or she may just follow up with her doctor.  She understands the potential for a cervical spine fracture and that this could lead to permanent disability.  Despite this the patient states that she will not be coming back in the middle night.    Hoy Morn, MD 09/18/14 [phone]

## 2014-09-18 NOTE — ED Provider Notes (Signed)
Medical screening examination/treatment/procedure(s) were conducted as a shared visit with non-physician practitioner(s) and myself.  I personally evaluated the patient during the encounter.   EKG Interpretation None       Patient is a 81 y.o. F on Coumadin who presented last night with a fall. CT of her head and x-ray of her hand were unremarkable. CT of her cervical spine was ordered and she was having neck pain at that time she left AGAINST MEDICAL ADVICE. Was advised to return to the emergency department for imaging. Is here this morning for CT scan. Is neurologically intact with no complaints of pain. CT of her cervical spine shows cervical spondylosis without acute fracture of subluxation. We'll discharge home.    [place], DO 09/18/14 1153

## 2014-09-18 NOTE — ED Notes (Signed)
Pt transported to CT ?

## 2014-09-18 NOTE — ED Notes (Signed)
Pt alert, oriented, and ambulatory upon DC. She was advised to follow up with PCP.

## 2014-09-18 NOTE — ED Notes (Signed)
Pt reports she was seen here yesterday for a fall. Had head CT and hand x ray done. MD wanted neck imagery, but it did not get done. After imaging was ordered pt had to leave due to ride leaving. Left AMA. MD called pt last night and told her to return today for neck imaging.

## 2014-09-18 NOTE — ED Notes (Signed)
PA at bedside.

## 2014-09-18 NOTE — ED Notes (Signed)
Pt wanting to know how long she is going to be here. Pt made aware of awaiting for CT results. She reports she has an appointment in 20 min. MD Ward spoke with patient and made here aware that if she leaves she will need to sign out AMA again.

## 2014-09-18 NOTE — ED Notes (Signed)
Patient wishes to leave AMA. In NAD. Ambulatory accompanied by neighbor.

## 2014-09-18 NOTE — ED Notes (Signed)
Pt getting changed into a gown.

## 2014-09-18 NOTE — Discharge Instructions (Signed)
Imaging of the neck was negative for acute fracture.  Please follow-up with your primary care physician.  Return to the ED for any concerns.

## 2014-09-18 NOTE — ED Provider Notes (Signed)
CSN: [id number]     Arrival date & time 09/18/14  0935 History   First MD Initiated Contact with Patient 09/18/14 1004     Chief Complaint  Patient presents with  . Fall     (Consider location/radiation/quality/duration/timing/severity/associated sxs/prior Treatment) The history is provided by the patient and medical records.    This is an 81 year old female with past medical history significant for hypertension, atrial fibrillation on Coumadin, congestive heart failure, coronary artery disease , arthritis, presenting to the ED for a fall that occurred yesterday while getting up from her couch. She was seen in the ED overnight and left AMA prior to having CT of her neck performed. She states she was called and told to come back to the ED for imaging this morning. She states since going home her neck pain has improved. She denies any residual pain. She  Denies numbness, weakness, or paresthesias of her upper extremities. She denies any current headache, dizziness, visual disturbance, tinnitus, changes in speech, or gait disturbance.  VSS on arrival.  Past Medical History  Diagnosis Date  . AF (atrial fibrillation)   . Hypertension   . Aortic insufficiency   . Mitral regurgitation   . Tricuspid regurgitation   . CHF (congestive heart failure)   . Aortic aneurysm   . CAD (coronary artery disease)   . Arthritis   . Claustrophobia   . Shortness of breath   . Broken neck     40 years ago   Past Surgical History  Procedure Laterality Date  . Tonsillectomy    . Cystectomy      sacrum  . Thoracic aortic endovascular stent graft N/A 10/02/2012    Procedure: THORACIC AORTIC ENDOVASCULAR STENT GRAFT;  Surgeon: Serafina Mitchell, MD;  Location: [facility];  Service: Vascular;  Laterality: N/A;  Ultrasound guided  . Thoracic aortic endovascular stent graft N/A 10/05/2012    Procedure: Endovascular repair of THORACIC AORTIC ANEURYSM;  Surgeon: Serafina Mitchell, MD;  Location: [facility];  Service: Vascular;   Laterality: N/A;  Angioplasty of Aortic throasic aneurysm, x1 aortagram,  Percutaneous acess right femoral artery.   Family History  Problem Relation Age of Onset  . Aneurysm Mother 25  . Cancer Mother     breast  . Heart disease Mother   . Hypertension Mother   . Hodgkin's lymphoma Father 42  . Cancer Father     hodgkins   History  Substance Use Topics  . Smoking status: Former Smoker -- 1.00 packs/day for 20 years    Types: Cigarettes    Quit date: 08/22/1985  . Smokeless tobacco: Never Used  . Alcohol Use: 5.4 oz/week    9 Glasses of wine per week     Comment: a glass of wine every night   OB History    No data available     Review of Systems  Musculoskeletal: Positive for neck pain (resolved).       Here for imaging  All other systems reviewed and are negative.     Allergies  Atorvastatin; Amiodarone hcl; Diltiazem hcl; Flecainide acetate; Metoprolol succinate; and Sotalol hcl  Home Medications   Prior to Admission medications   Medication Sig Start Date End Date Taking? Authorizing Provider  acetaminophen (TYLENOL) 500 MG tablet Take 500 mg by mouth every 4 (four) hours as needed for pain.     Historical Provider, MD  furosemide (LASIX) 20 MG tablet Take 1 tablet (20 mg total) by mouth daily. 08/25/14 10/10/15  Arnette Norris  Deboraha Sprang, MD  prazosin (MINIPRESS) 2 MG capsule Take 1 capsule (2 mg total) by mouth at bedtime. 06/21/14   Thayer Headings, MD  propranolol ER (INDERAL LA) 160 MG SR capsule Take 1 capsule (160 mg total) by mouth at bedtime. 08/25/14   Thayer Headings, MD  spironolactone (ALDACTONE) 25 MG tablet TAKE ONE HALF TABLET BY MOUTH EVERY MORNING Patient taking differently: Take 12.5 mg by mouth daily. TAKE ONE HALF TABLET BY MOUTH EVERY MORNING 08/25/14   Thayer Headings, MD  warfarin (COUMADIN) 5 MG tablet TAKE AS DIRECTED Patient not taking: Reported on 09/17/2014 08/30/14   Lendon Colonel, NP  warfarin (COUMADIN) 5 MG tablet Take 2.5-5 mg by mouth daily.  Take 0.5 tablet (2.5mg ) on all days except Wednesday take 1 tablet (5mg )    Historical Provider, MD   BP 128/61 mmHg  Pulse 73  Temp(Src) 97.6 F (36.4 C) (Oral)  Resp 16  SpO2 98%   Physical Exam  Constitutional: She is oriented to person, place, and time. She appears well-developed and well-nourished.  Reading book, NAD  HENT:  Head: Normocephalic and atraumatic.  Mouth/Throat: Oropharynx is clear and moist.  Eyes: Conjunctivae and EOM are normal. Pupils are equal, round, and reactive to light.  Neck: Normal range of motion.  Cardiovascular: Normal rate, regular rhythm and normal heart sounds.   Pulmonary/Chest: Effort normal and breath sounds normal. No respiratory distress. She has no wheezes.  Abdominal: Soft. Bowel sounds are normal.  Musculoskeletal: Normal range of motion.       Cervical back: Normal.  CS non-tender; no midline deformities or step off; full ROM maintained;  Normal grip strength bilaterally;  Both arms remain neurovascularly intact  Neurological: She is alert and oriented to person, place, and time.  AAOx3, answering questions appropriately; equal strength UE and LE bilaterally; CN grossly intact; moves all extremities appropriately without ataxia; no focal neuro deficits or facial asymmetry appreciated  Skin: Skin is warm and dry.  Psychiatric: She has a normal mood and affect.  Nursing note and vitals reviewed.   ED Course  Procedures (including critical care time) Labs Review Labs Reviewed - No data to display  Imaging Review Ct Head Wo Contrast  09/17/2014   CLINICAL DATA:  Fall today with left periorbital hematoma. Initial encounter.  EXAM: CT HEAD WITHOUT CONTRAST  TECHNIQUE: Contiguous axial images were obtained from the base of the skull through the vertex without intravenous contrast.  COMPARISON:  None.  FINDINGS: Skull and Sinuses:Negative for fracture or destructive process. The mastoids, middle ears, and imaged paranasal sinuses are clear.   Orbits: No acute abnormality.  Brain: No evidence of acute infarction, hemorrhage, hydrocephalus, or mass lesion/mass effect. Generalized brain atrophy with ex vacuo ventricular enlargement. Moderate chronic small vessel disease with ischemic gliosis throughout the bilateral cerebral white matter.  IMPRESSION: 1. No acute intracranial injury. 2. Brain atrophy and chronic small vessel disease.   Electronically Signed   By: Monte Fantasia M.D.   On: 09/17/2014 22:05   Ct Cervical Spine Wo Contrast  09/18/2014   CLINICAL DATA:  Fall  off the couch.  On blood thinners.  EXAM: CT CERVICAL SPINE WITHOUT CONTRAST  TECHNIQUE: Multidetector CT imaging of the cervical spine was performed without intravenous contrast. Multiplanar CT image reconstructions were also generated.  COMPARISON:  Head CT of 1 day prior. Cervical spine radiographs of 05/16/2009.  FINDINGS: Spinal visualization through the bottom of T2. Prevertebral soft tissues are within normal limits. Dense  carotid atherosclerosis. Biapical pleural-parenchymal scarring. No apical pneumothorax.  Multilevel cervical spondylosis.  Skull base intact. Maintenance of vertebral body height and alignment. Advanced degenerative disc disease at C5-6 and C6-7. Facets are well-aligned. Right-sided facet arthropathy, most advanced at C 2-3 and C3-4. Coronal reformats demonstrate a normal C1-C2 articulation.  IMPRESSION: Cervical spondylosis, without acute fracture or subluxation.   Electronically Signed   By: Abigail Miyamoto M.D.   On: 09/18/2014 11:43   Dg Hand Complete Left  09/17/2014   CLINICAL DATA:  Fall with swelling of the left hand. Initial encounter.  EXAM: LEFT HAND - COMPLETE 3+ VIEW  COMPARISON:  None.  FINDINGS: Prominent soft tissue swelling over the dorsal hand, mainly at the level of the MCP joints. There is no underlying fracture or dislocation.  Profound osteopenia.  Moderate first CMC osteoarthritis with joint narrowing and spurring. There is also diffuse  interphalangeal and MCP joint narrowing.  IMPRESSION: No acute osseous findings.   Electronically Signed   By: Monte Fantasia M.D.   On: 09/17/2014 21:24     EKG Interpretation None      MDM   Final diagnoses:  Fall, subsequent encounter   81 year old female with fall yesterday. Patient was seen in the ED overnight and left AMA prior to having imaging of her cervical spine performed. She states since returning home her neck pain has improved PA se denies any recurrent headache, dizziness , lightheadedness, numbness or weakness of extremities. Imaging from earlier in the night was reviewed and is negative for acute findings.  Neurologic exam remains nonfocal.  Will obtain CT of her cervical spine.   CT of cervical spine negative for acute findings. Patient continues to have full range of motion of her neck without noted tenderness or deformity. She'll be discharged home with close PCP follow-up.  Discussed plan with patient, he/she acknowledged understanding and agreed with plan of care.  Return precautions given for new or worsening symptoms.  Case discussed with attending physician, Dr. Leonides Schanz, who evaluated patient and agrees with assessment and plan of care.  Larene Pickett, PA-C 09/18/14 1311

## 2014-09-21 ENCOUNTER — Telehealth: Payer: Self-pay | Admitting: Family Medicine

## 2014-09-21 NOTE — Telephone Encounter (Signed)
Called pt re the request for the TENS unti from [facility].  She states they kept pestering her and she really does not want the unit.

## 2014-09-22 ENCOUNTER — Ambulatory Visit (INDEPENDENT_AMBULATORY_CARE_PROVIDER_SITE_OTHER): Payer: Medicare Other | Admitting: *Deleted

## 2014-09-22 DIAGNOSIS — I4891 Unspecified atrial fibrillation: Secondary | ICD-10-CM | POA: Diagnosis not present

## 2014-09-22 DIAGNOSIS — Z7901 Long term (current) use of anticoagulants: Secondary | ICD-10-CM | POA: Diagnosis not present

## 2014-09-22 DIAGNOSIS — S43002D Unspecified subluxation of left shoulder joint, subsequent encounter: Secondary | ICD-10-CM | POA: Diagnosis not present

## 2014-09-22 LAB — POCT INR: INR: 2.5

## 2014-09-24 ENCOUNTER — Ambulatory Visit (INDEPENDENT_AMBULATORY_CARE_PROVIDER_SITE_OTHER): Payer: Medicare Other | Admitting: Medical

## 2014-09-24 ENCOUNTER — Encounter: Payer: Self-pay | Admitting: Medical

## 2014-09-24 VITALS — BP 98/60 | HR 69 | Temp 97.1°F | Resp 14 | Wt 120.6 lb

## 2014-09-24 DIAGNOSIS — S60222A Contusion of left hand, initial encounter: Secondary | ICD-10-CM

## 2014-09-24 DIAGNOSIS — S43002D Unspecified subluxation of left shoulder joint, subsequent encounter: Secondary | ICD-10-CM | POA: Diagnosis not present

## 2014-09-24 DIAGNOSIS — Z7901 Long term (current) use of anticoagulants: Secondary | ICD-10-CM

## 2014-09-24 MED ORDER — OXYCODONE HCL 5 MG PO TABS: 5.0000 mg | ORAL_TABLET | ORAL | Status: DC | PRN

## 2014-09-24 NOTE — Progress Notes (Signed)
Subjective: Last Friday while going to answer the phone, hit left hand on coffee table.  She got leg tripped up on coffee table and fell.  Hit left hand on coffee table, hit head on coffee table.  Had some small abrasions of face.  Ended up going to the ED at [facility], had awful experience, had xrays and head.  7 hours later, was still there.  Ended up leaving at 2am and went back next morning to get a neck xray as f/u.   By next morning left hand felt some better.  Over the last several days felt ok, but the last 24 hours left hand has suddenly become a lot more painful.  Can't bear to touch one area of left hand. Currently only c/o is the hand.  Denies numbness or tingling, just pain.  Using ice.  Had been taking tylenol for various aches, but after Dr. Redmond School found her liver tests up, she has only been using 1 tylenol in the morning.  hasn't had any kind of splint or brace.  No re injury or other new fall, trauma or injury.  No other aggravating or relieving factors. No other complaint.  On coumadin, sees Coumadin clinic.  Last PT/INR 2.5 on Wednesday of this week.    ROS as in subjective  Objective: BP 98/60 mmHg  Pulse 69  Temp(Src) 97.1 F (36.2 C) (Oral)  Resp 14  Wt 120 lb 9.6 oz (54.704 kg)  Gen: wd, wn, nad Left 4th and 5th fingers with purplish ecchymosis along CMC and mid to distal metacarpals, tender over same area, decreased ROM of 4th and 5th fingers due to pain, but rest of hand nontender and normal ROM.  No other deformity.   Rest of UE exam unremarkable. Pulses: normal, normal cap refil Neuro: normal sensation and strength of hands and fingers   Assessment: Encounter Diagnoses  Name Primary?  . Contusion of hand, left, initial encounter Yes  . Long term current use of anticoagulant therapy    Plan: Begin boxer's splint, rest, ice, elevation, tylenol or oxycodone prn for pain.  F/u if not much improved by early next week.  discussed findings that would prompt visit to  the ED such as worse swelling, white fingertips, decreased cap refill, cool fingers, etc.

## 2014-09-27 DIAGNOSIS — S43002D Unspecified subluxation of left shoulder joint, subsequent encounter: Secondary | ICD-10-CM | POA: Diagnosis not present

## 2014-09-29 ENCOUNTER — Ambulatory Visit (INDEPENDENT_AMBULATORY_CARE_PROVIDER_SITE_OTHER): Payer: Medicare Other | Admitting: Family Medicine

## 2014-09-29 ENCOUNTER — Encounter: Payer: Self-pay | Admitting: Family Medicine

## 2014-09-29 VITALS — BP 118/70 | HR 63 | Wt 121.0 lb

## 2014-09-29 DIAGNOSIS — R7309 Other abnormal glucose: Secondary | ICD-10-CM | POA: Diagnosis not present

## 2014-09-29 DIAGNOSIS — I700 Atherosclerosis of aorta: Secondary | ICD-10-CM

## 2014-09-29 DIAGNOSIS — S60222A Contusion of left hand, initial encounter: Secondary | ICD-10-CM

## 2014-09-29 DIAGNOSIS — S43002D Unspecified subluxation of left shoulder joint, subsequent encounter: Secondary | ICD-10-CM | POA: Diagnosis not present

## 2014-09-29 DIAGNOSIS — K8020 Calculus of gallbladder without cholecystitis without obstruction: Secondary | ICD-10-CM | POA: Diagnosis not present

## 2014-09-29 DIAGNOSIS — R739 Hyperglycemia, unspecified: Secondary | ICD-10-CM

## 2014-09-29 DIAGNOSIS — M79606 Pain in leg, unspecified: Secondary | ICD-10-CM

## 2014-09-29 LAB — POCT GLYCOSYLATED HEMOGLOBIN (HGB A1C): HEMOGLOBIN A1C: 5.6

## 2014-09-29 NOTE — Progress Notes (Signed)
   Subjective:    Patient ID: [name] [name], female    DOB: 01-08-1932, 81 y.o.   MRN: [id number]  HPI She is here for an interval visit. She was recently seen in the emergency room after a fall. The emergency room record including CT scanning was reviewed. Time she has had no difficulty with headache, neck pain and only minor left hand discomfort. X-rays did show evidence of atherosclerosis. She did leave the emergency room AMA.Recent abdominal ultrasound did show gallstones. She is having no abdominal pain, nausea, vomiting, bloating or gas. She does continue to complain of lower extremity pain with physical activity. She was scheduled for an arterial Doppler but apparently did not get is accomplished. Recent blood work also showed a slightly elevated blood sugar.   Review of Systems     Objective:   Physical Exam Alert and in no distress. Small contusion noted between the second and third MCP joints. Motion of the wrist. Hemoglobin A1c is 5.6       Assessment & Plan:  Contusion of hand, left, initial encounter  Gallstones  Atherosclerosis of aorta  Elevated blood sugar - Plan: HgB A1c  Pain of lower extremity, unspecified laterality - Plan: Lower Extremity Arterial Doppler Bilateral No further intervention needed due to her recent trauma. I discussed gallstones with her and the care and at this point she is not interested in pursuing surgical intervention.

## 2014-10-01 DIAGNOSIS — L821 Other seborrheic keratosis: Secondary | ICD-10-CM | POA: Diagnosis not present

## 2014-10-01 DIAGNOSIS — L570 Actinic keratosis: Secondary | ICD-10-CM | POA: Diagnosis not present

## 2014-10-01 DIAGNOSIS — D1801 Hemangioma of skin and subcutaneous tissue: Secondary | ICD-10-CM | POA: Diagnosis not present

## 2014-10-01 DIAGNOSIS — D692 Other nonthrombocytopenic purpura: Secondary | ICD-10-CM | POA: Diagnosis not present

## 2014-10-01 DIAGNOSIS — D2239 Melanocytic nevi of other parts of face: Secondary | ICD-10-CM | POA: Diagnosis not present

## 2014-10-01 DIAGNOSIS — L720 Epidermal cyst: Secondary | ICD-10-CM | POA: Diagnosis not present

## 2014-10-01 DIAGNOSIS — L814 Other melanin hyperpigmentation: Secondary | ICD-10-CM | POA: Diagnosis not present

## 2014-10-04 ENCOUNTER — Ambulatory Visit (HOSPITAL_COMMUNITY)
Admission: RE | Admit: 2014-10-04 | Discharge: 2014-10-04 | Disposition: A | Discharge status: Home / Self Care | Payer: Medicare Other | Source: Ambulatory Visit | Attending: Family Medicine | Admitting: Family Medicine

## 2014-10-04 DIAGNOSIS — I7123 Aneurysm of the descending thoracic aorta, without rupture: Secondary | ICD-10-CM

## 2014-10-04 DIAGNOSIS — S43002D Unspecified subluxation of left shoulder joint, subsequent encounter: Secondary | ICD-10-CM | POA: Diagnosis not present

## 2014-10-04 DIAGNOSIS — I739 Peripheral vascular disease, unspecified: Secondary | ICD-10-CM | POA: Diagnosis not present

## 2014-10-04 DIAGNOSIS — M79606 Pain in leg, unspecified: Secondary | ICD-10-CM

## 2014-10-04 DIAGNOSIS — I700 Atherosclerosis of aorta: Secondary | ICD-10-CM

## 2014-10-04 DIAGNOSIS — I712 Thoracic aortic aneurysm, without rupture: Secondary | ICD-10-CM

## 2014-10-04 NOTE — Progress Notes (Signed)
VASCULAR LAB PRELIMINARY  ARTERIAL  ABI completed: ABIs are within normal limits at rest.    RIGHT    LEFT    PRESSURE WAVEFORM  PRESSURE WAVEFORM  BRACHIAL 149  T BRACHIAL 155  T  DP   DP    AT 138  B AT 136 B  PT 152 B PT 155 B  PER   PER    GREAT TOE  NA GREAT TOE  NA    RIGHT LEFT  ABI 0.98 1.0     Garrit Marrow, RVT 10/04/2014, 10:50 AM

## 2014-10-05 ENCOUNTER — Other Ambulatory Visit: Payer: Self-pay

## 2014-10-06 ENCOUNTER — Telehealth: Payer: Self-pay

## 2014-10-06 DIAGNOSIS — S43002D Unspecified subluxation of left shoulder joint, subsequent encounter: Secondary | ICD-10-CM | POA: Diagnosis not present

## 2014-10-06 NOTE — Telephone Encounter (Signed)
Arterial Doppler of her lower legs not an ABI

## 2014-10-06 NOTE — Telephone Encounter (Signed)
Dr.Lalonde what kind of doppler do you want her to have with Dr.Berry

## 2014-10-11 ENCOUNTER — Other Ambulatory Visit: Payer: Self-pay

## 2014-10-11 DIAGNOSIS — I700 Atherosclerosis of aorta: Secondary | ICD-10-CM

## 2014-10-11 DIAGNOSIS — I712 Thoracic aortic aneurysm, without rupture, unspecified: Secondary | ICD-10-CM

## 2014-10-11 DIAGNOSIS — M79606 Pain in leg, unspecified: Secondary | ICD-10-CM

## 2014-10-11 NOTE — Telephone Encounter (Signed)
Put orders in

## 2014-10-12 DIAGNOSIS — S43002D Unspecified subluxation of left shoulder joint, subsequent encounter: Secondary | ICD-10-CM | POA: Diagnosis not present

## 2014-10-13 ENCOUNTER — Telehealth: Payer: Self-pay | Admitting: Family Medicine

## 2014-10-13 ENCOUNTER — Telehealth (HOSPITAL_COMMUNITY): Payer: Self-pay | Admitting: *Deleted

## 2014-10-13 NOTE — Telephone Encounter (Signed)
Left message for Charlena Cross that Dr.Lalonde wants a lower extremity doppler done on the patient an ABI was done on 10/04/14

## 2014-10-13 NOTE — Telephone Encounter (Signed)
[name] [name] @ [facility] has orders for our office for a doppler . Pt had a doppler on 10/04/14. Does pt she need another doppler? Please call [name] [name] @ [phone] Ext [phone]

## 2014-10-15 DIAGNOSIS — S43002D Unspecified subluxation of left shoulder joint, subsequent encounter: Secondary | ICD-10-CM | POA: Diagnosis not present

## 2014-10-20 ENCOUNTER — Ambulatory Visit (HOSPITAL_COMMUNITY)
Admission: RE | Admit: 2014-10-20 | Discharge: 2014-10-20 | Disposition: A | Discharge status: Home / Self Care | Payer: Medicare Other | Source: Ambulatory Visit | Attending: Cardiovascular Disease | Admitting: Cardiovascular Disease

## 2014-10-20 DIAGNOSIS — I700 Atherosclerosis of aorta: Secondary | ICD-10-CM

## 2014-10-20 DIAGNOSIS — M79606 Pain in leg, unspecified: Secondary | ICD-10-CM | POA: Diagnosis not present

## 2014-10-20 DIAGNOSIS — I712 Thoracic aortic aneurysm, without rupture, unspecified: Secondary | ICD-10-CM

## 2014-10-20 NOTE — Progress Notes (Addendum)
Lower extremity arterial duplex completed. No evidence for arterial insufficiency. There is evidence for a known AAA in the proximal segment with measurements of 4.9 x 4.9 centimeters.  ABI's were not performed seeing as they were performed on 10/04/2014 at [facility] and appeared normal. Brianna L Mazza,RVT

## 2014-11-03 ENCOUNTER — Ambulatory Visit (INDEPENDENT_AMBULATORY_CARE_PROVIDER_SITE_OTHER): Payer: Medicare Other | Admitting: *Deleted

## 2014-11-03 DIAGNOSIS — Z5181 Encounter for therapeutic drug level monitoring: Secondary | ICD-10-CM

## 2014-11-03 DIAGNOSIS — Z7901 Long term (current) use of anticoagulants: Secondary | ICD-10-CM | POA: Diagnosis not present

## 2014-11-03 DIAGNOSIS — I4891 Unspecified atrial fibrillation: Secondary | ICD-10-CM

## 2014-11-03 DIAGNOSIS — I482 Chronic atrial fibrillation, unspecified: Secondary | ICD-10-CM

## 2014-11-03 LAB — POCT INR: INR: 1.9

## 2014-12-15 ENCOUNTER — Ambulatory Visit (INDEPENDENT_AMBULATORY_CARE_PROVIDER_SITE_OTHER): Payer: Medicare Other | Admitting: *Deleted

## 2014-12-15 DIAGNOSIS — Z7901 Long term (current) use of anticoagulants: Secondary | ICD-10-CM

## 2014-12-15 DIAGNOSIS — I4891 Unspecified atrial fibrillation: Secondary | ICD-10-CM

## 2014-12-15 DIAGNOSIS — Z5181 Encounter for therapeutic drug level monitoring: Secondary | ICD-10-CM | POA: Diagnosis not present

## 2014-12-15 DIAGNOSIS — I482 Chronic atrial fibrillation, unspecified: Secondary | ICD-10-CM

## 2014-12-15 LAB — POCT INR: INR: 2.6

## 2014-12-17 ENCOUNTER — Other Ambulatory Visit: Payer: Self-pay | Admitting: *Deleted

## 2014-12-17 DIAGNOSIS — I712 Thoracic aortic aneurysm, without rupture: Secondary | ICD-10-CM

## 2014-12-17 DIAGNOSIS — I7123 Aneurysm of the descending thoracic aorta, without rupture: Secondary | ICD-10-CM

## 2015-01-18 DIAGNOSIS — I712 Thoracic aortic aneurysm, without rupture: Secondary | ICD-10-CM | POA: Diagnosis not present

## 2015-01-19 LAB — CREATININE, SERUM: Creat: 1.2 mg/dL — ABNORMAL HIGH (ref 0.50–1.10)

## 2015-01-19 LAB — BUN: BUN: 36 mg/dL — ABNORMAL HIGH (ref 6–23)

## 2015-01-20 ENCOUNTER — Ambulatory Visit (INDEPENDENT_AMBULATORY_CARE_PROVIDER_SITE_OTHER): Payer: Medicare Other | Admitting: Cardiothoracic Surgery

## 2015-01-20 ENCOUNTER — Encounter: Payer: Self-pay | Admitting: Cardiothoracic Surgery

## 2015-01-20 ENCOUNTER — Ambulatory Visit
Admission: RE | Admit: 2015-01-20 | Discharge: 2015-01-20 | Disposition: A | Discharge status: Home / Self Care | Payer: Medicare Other | Source: Ambulatory Visit | Attending: Cardiothoracic Surgery | Admitting: Cardiothoracic Surgery

## 2015-01-20 VITALS — BP 140/83 | HR 80 | Resp 20 | Ht 62.0 in | Wt 115.0 lb

## 2015-01-20 DIAGNOSIS — I712 Thoracic aortic aneurysm, without rupture, unspecified: Secondary | ICD-10-CM

## 2015-01-20 DIAGNOSIS — I7123 Aneurysm of the descending thoracic aorta, without rupture: Secondary | ICD-10-CM

## 2015-01-20 MED ORDER — IOPAMIDOL (ISOVUE-370) INJECTION 76%
75.0000 mL | Freq: Once | INTRAVENOUS | Status: AC | PRN
  Administered 2015-01-20: 75 mL via INTRAVENOUS
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
## 2015-01-25 ENCOUNTER — Other Ambulatory Visit: Payer: Self-pay | Admitting: *Deleted

## 2015-01-25 ENCOUNTER — Ambulatory Visit (INDEPENDENT_AMBULATORY_CARE_PROVIDER_SITE_OTHER): Payer: Medicare Other | Admitting: *Deleted

## 2015-01-25 DIAGNOSIS — I482 Chronic atrial fibrillation, unspecified: Secondary | ICD-10-CM

## 2015-01-25 DIAGNOSIS — I712 Thoracic aortic aneurysm, without rupture, unspecified: Secondary | ICD-10-CM

## 2015-01-25 DIAGNOSIS — I4891 Unspecified atrial fibrillation: Secondary | ICD-10-CM

## 2015-01-25 DIAGNOSIS — Z5181 Encounter for therapeutic drug level monitoring: Secondary | ICD-10-CM | POA: Diagnosis not present

## 2015-01-25 DIAGNOSIS — Z7901 Long term (current) use of anticoagulants: Secondary | ICD-10-CM

## 2015-01-25 LAB — POCT INR: INR: 3.4

## 2015-02-02 ENCOUNTER — Ambulatory Visit
Admission: RE | Admit: 2015-02-02 | Discharge: 2015-02-02 | Disposition: A | Discharge status: Home / Self Care | Payer: Medicare Other | Source: Ambulatory Visit | Attending: Family Medicine | Admitting: Family Medicine

## 2015-02-02 ENCOUNTER — Ambulatory Visit (INDEPENDENT_AMBULATORY_CARE_PROVIDER_SITE_OTHER): Payer: Medicare Other | Admitting: Family Medicine

## 2015-02-02 ENCOUNTER — Encounter: Payer: Self-pay | Admitting: Family Medicine

## 2015-02-02 VITALS — BP 144/74 | HR 56 | Ht 62.75 in | Wt 116.0 lb

## 2015-02-02 DIAGNOSIS — S4991XA Unspecified injury of right shoulder and upper arm, initial encounter: Secondary | ICD-10-CM | POA: Diagnosis not present

## 2015-02-02 DIAGNOSIS — M25511 Pain in right shoulder: Secondary | ICD-10-CM | POA: Diagnosis not present

## 2015-02-02 MED ORDER — TRAMADOL HCL 50 MG PO TABS: 50.0000 mg | ORAL_TABLET | Freq: Four times a day (QID) | ORAL | Status: DC | PRN

## 2015-02-02 NOTE — Progress Notes (Signed)
Chief Complaint  Patient presents with  . Shoulder Pain    fell on right shoulder around midnight last night on tile kitchen floor. Is in an incredible amount of pain-around a 10 when she moves certain ways. Would like to make sure shge has not broken her shoulder.   She slipped/tripped on her kitchen floor last night, around midnight.  She hit her right shoulder on the floor.  It was painful immediately.  She was able to get right up.  She wasn't able to sleep last night due to the pain.  No change in her pain throughout the day.  Pain is localized to the right shoulder. No radiation into the arm.  Denies any neck pain.  Denies numbness/tingling/weakness in the arm. She took tylenol this morning without any benefit.  She denies any loss of consciousness, dizziness or other underlying cause for her fall.  She has chronic back problems, and sometimes her legs will get weak, and she lost her balance and fell.  Denies no chest pain, shortness of breath (stable/unchanged), no dizziness, tachycardia, palpitations.  PMH, PSH, SH reviewed and updated.  Outpatient Encounter Prescriptions as of 02/02/2015  Medication Sig  . acetaminophen (TYLENOL) 500 MG tablet Take 1,000 mg by mouth every 4 (four) hours as needed for pain.   . furosemide (LASIX) 20 MG tablet Take 1 tablet (20 mg total) by mouth daily.  . prazosin (MINIPRESS) 2 MG capsule Take 1 capsule (2 mg total) by mouth at bedtime.  . propranolol ER (INDERAL LA) 160 MG SR capsule Take 1 capsule (160 mg total) by mouth at bedtime.  Marland Kitchen spironolactone (ALDACTONE) 25 MG tablet TAKE ONE HALF TABLET BY MOUTH EVERY MORNING (Patient taking differently: Take 12.5 mg by mouth daily. TAKE ONE HALF TABLET BY MOUTH EVERY MORNING)  . warfarin (COUMADIN) 5 MG tablet Take 2.5-5 mg by mouth daily. Take 0.5 tablet (2.5mg ) on all days except Wednesday take 1 tablet (5mg )   No facility-administered encounter medications on file as of 02/02/2015.   Allergies  Allergen  Reactions  . Atorvastatin     Muscle aches  . Amiodarone Hcl     REACTION: Nausea/Vomiting; decrease appetite; affects liver enzymes  . Diltiazem Hcl     REACTION: Nausea/vomitting  . Flecainide Acetate     REACTION: Intol: nausea/horrible feeling  . Metoprolol Succinate     REACTION: Nausea/vomiting;dizziness  . Sotalol Hcl     REACTION: Nausea/vomiting   ROS: no fever, chills, headache, dizziness, chest pain, palpitations, shortness of breath, GI complaints. No bleeding/bruising. She is on coumadin. Denies numbness, tingling, weakness. Denies other joint pains or other concerns  PHYSICAL EXAM: BP 144/74 mmHg  Pulse 56  Ht 5' 2.75" (1.594 m)  Wt 116 lb (52.617 kg)  BMI 20.71 kg/m2 Well developed, pleasant, tan female in no discomfort when sitting at rest.  +discomfort with certain arm movements. Tender at the right [facility] joint, and there appears to be a wider gap on the right side when compared to the left. . She is very tender over her muscle posteriorly (trapezius). Decreased ROM of the RUE--100 degree abduction. Full forward flexion Some decrease ROM with internal rotation, but pain isn't as bad and she was able to fasten her bra. No bruising, hematoma, or other abnormalities. Normal strength, sensation. 2+ pulses, brisk capillary refill. Normal gait. Normal mood, affect, hygiene and grooming.  ASSESSMENT/PLAN:  Right shoulder pain - Plan: DG Shoulder Right, traMADol (ULTRAM) 50 MG tablet  Suspect AC separation vs fracture.  Pt on coumadin--can't take NSAIDS.  Risks/side effects of tramadol reviewed in detail; use prn severe pain; use tylenol for mild pain.    Go to [facility] (either [address] or [place]) for x-ray of your shoulder. If it is fractured, we will need to get you in with an orthopedist. If there is no fracture, then you need to help control the pain, and work gradually on range of motion.  You don't want to immobilize the shoulder for too long,  or it will get stiff.  Use tylenol as needed for pain.  Use tramadol for more severe pain (you can use them together, if needed)

## 2015-02-02 NOTE — Patient Instructions (Addendum)
  Go to [facility] (either [address] or [place]) for x-ray of your shoulder. If it is fractured, we will need to get you in with an orthopedist. I suspect an [facility] joint separation or a fracture.  You don't want to immobilize the shoulder for too long, or it will get stiff (unless told to by an orthopedist).   Use tylenol as needed for pain.  Use tramadol for more severe pain (you can use them together, if needed)

## 2015-02-03 DIAGNOSIS — S42034A Nondisplaced fracture of lateral end of right clavicle, initial encounter for closed fracture: Secondary | ICD-10-CM | POA: Diagnosis not present

## 2015-02-03 DIAGNOSIS — S4991XA Unspecified injury of right shoulder and upper arm, initial encounter: Secondary | ICD-10-CM | POA: Diagnosis not present

## 2015-02-10 ENCOUNTER — Ambulatory Visit
Admission: RE | Admit: 2015-02-10 | Discharge: 2015-02-10 | Disposition: A | Discharge status: Home / Self Care | Payer: Medicare Other | Source: Ambulatory Visit | Attending: Cardiothoracic Surgery | Admitting: Cardiothoracic Surgery

## 2015-02-10 DIAGNOSIS — I712 Thoracic aortic aneurysm, without rupture, unspecified: Secondary | ICD-10-CM

## 2015-02-10 NOTE — Consult Note (Signed)
Chief Complaint: Patient was seen in consultation today for  Chief Complaint  Patient presents with  . Advice Only    type 2 endoleak     at the request of Gerhardt,Edward B  Referring Physician(s): Gerhardt,Edward B  History of Present Illness: [name] [name] is a 81 y.o. female with multiple medical problems including aneurysmal dilatation of her descending thoracic aorta. She underwent thoracic endovascular aortic repair on 10/02/2012 using 2 overlapping Gore cTAG endoprostheses.  Her postoperative course was complicated by acute exacerbation of her chronic back pain requiring repeat arteriography. No endoleak was identified.  Postoperative surveillance imaging has been, gated thigh the presence of a type II endoleak. Her aneurysm sac has demonstrated significant interval growth over the past 6 months of at least 5 mm. Additionally, the amount of contrast material in the excluded aneurysm sac appears to be increasing concerning for progression of the type II endoleak.  Currently, she is relatively asymptomatic. She continues to have fatigue and chronic back pain but this has improved significantly over time since her last procedure. She is very intimidated by the thought of undergoing additional procedures given what she feels was a prolonged recovery.  Past Medical History  Diagnosis Date  . AF (atrial fibrillation)   . Hypertension   . Aortic insufficiency   . Mitral regurgitation   . Tricuspid regurgitation   . CHF (congestive heart failure)   . Aortic aneurysm   . CAD (coronary artery disease)   . Arthritis   . Claustrophobia   . Shortness of breath   . Broken neck     40 years ago    Past Surgical History  Procedure Laterality Date  . Tonsillectomy    . Cystectomy      sacrum  . Thoracic aortic endovascular stent graft N/A 10/02/2012    Procedure: THORACIC AORTIC ENDOVASCULAR STENT GRAFT;  Surgeon: Serafina Mitchell, MD;  Location: [facility];  Service: Vascular;   Laterality: N/A;  Ultrasound guided  . Thoracic aortic endovascular stent graft N/A 10/05/2012    Procedure: Endovascular repair of THORACIC AORTIC ANEURYSM;  Surgeon: Serafina Mitchell, MD;  Location: [facility];  Service: Vascular;  Laterality: N/A;  Angioplasty of Aortic throasic aneurysm, x1 aortagram,  Percutaneous acess right femoral artery.    Allergies: Atorvastatin; Amiodarone hcl; Diltiazem hcl; Flecainide acetate; Metoprolol succinate; and Sotalol hcl  Medications: Prior to Admission medications   Medication Sig Start Date End Date Taking? Authorizing Provider  acetaminophen (TYLENOL) 500 MG tablet Take 1,000 mg by mouth every 4 (four) hours as needed for pain.    Yes Historical Provider, MD  furosemide (LASIX) 20 MG tablet Take 1 tablet (20 mg total) by mouth daily. 08/25/14 10/10/15 Yes Thayer Headings, MD  prazosin (MINIPRESS) 2 MG capsule Take 1 capsule (2 mg total) by mouth at bedtime. 06/21/14  Yes Thayer Headings, MD  propranolol ER (INDERAL LA) 160 MG SR capsule Take 1 capsule (160 mg total) by mouth at bedtime. 08/25/14  Yes Thayer Headings, MD  spironolactone (ALDACTONE) 25 MG tablet TAKE ONE HALF TABLET BY MOUTH EVERY MORNING Patient taking differently: Take 12.5 mg by mouth daily. TAKE ONE HALF TABLET BY MOUTH EVERY MORNING 08/25/14  Yes Thayer Headings, MD  traMADol (ULTRAM) 50 MG tablet Take 1 tablet (50 mg total) by mouth every 6 (six) hours as needed for moderate pain or severe pain. Patient not taking: Reported on 02/10/2015 02/02/15   Rita Ohara, MD  warfarin (COUMADIN)  5 MG tablet Take 2.5-5 mg by mouth daily. Take 0.5 tablet (2.5mg ) on all days except Wednesday take 1 tablet (5mg )    Historical Provider, MD     Family History  Problem Relation Age of Onset  . Aneurysm Mother 34  . Cancer Mother     breast  . Heart disease Mother   . Hypertension Mother   . Hodgkin's lymphoma Father 94  . Cancer Father     hodgkins    History   Social History  . Marital Status:  Widowed    Spouse Name: N/A  . Number of Children: 3  . Years of Education: N/A   Occupational History  . Architect    Social History Main Topics  . Smoking status: Former Smoker -- 1.00 packs/day for 20 years    Types: Cigarettes    Quit date: 08/22/1985  . Smokeless tobacco: Never Used  . Alcohol Use: 5.4 oz/week    9 Glasses of wine per week     Comment: a glass of wine every night  . Drug Use: No  . Sexual Activity: No   Other Topics Concern  . Not on file   Social History Narrative    Review of Systems: A 12 point ROS discussed and pertinent positives are indicated in the HPI above.  All other systems are negative.  Review of Systems  Vital Signs: BP 112/73 mmHg  Pulse 68  Temp(Src) 98 F (36.7 C) (Oral)  Resp 14  Ht 5\' 2"  (1.575 m)  Wt 115 lb (52.164 kg)  BMI 21.03 kg/m2  SpO2 98%  Physical Exam  Constitutional: She is oriented to person, place, and time. She appears well-developed and well-nourished. No distress.  HENT:  Head: Normocephalic and atraumatic.  Eyes: No scleral icterus.  Cardiovascular: Normal rate.   Pulmonary/Chest: Effort normal.  Abdominal: Soft. She exhibits no distension. There is no tenderness.  Neurological: She is alert and oriented to person, place, and time.  Skin: Skin is warm and dry.  Psychiatric: She has a normal mood and affect. Her behavior is normal.  Nursing note and vitals reviewed.   Mallampati Score:     Imaging: Dg Shoulder Right  02/02/2015   CLINICAL DATA:  Fall on 02/01/2015 onto right shoulder with right acromioclavicular joint pain and decreased range of motion. Initial encounter.  EXAM: RIGHT SHOULDER - 2+ VIEW  COMPARISON:  05/16/2009.  FINDINGS: There are cortical lucencies within the distal right clavicle. Acromioclavicular joint is intact. Degenerative changes are seen in the acromioclavicular and glenohumeral joints. Right humeral head is somewhat high riding. Visualized portion of the right chest is  unremarkable.  IMPRESSION: 1. Strongly suspect a nondisplaced fracture of the distal right clavicle. 2. Degenerative changes in the right acromioclavicular and right glenohumeral joints. 3. Humeral head is somewhat high riding, indicative of a chronic rotator cuff tear.   Electronically Signed   By: Lorin Picket M.D.   On: 02/02/2015 16:34   Ct Angio Chest Aorta W/cm &/or Wo/cm  01/20/2015   CLINICAL DATA:  Follow-up thoracic aortic stent graft. Evaluate endoleak.  EXAM: CT ANGIOGRAPHY CHEST WITH CONTRAST  TECHNIQUE: Multidetector CT imaging of the chest was performed using the standard protocol during bolus administration of intravenous contrast. Multiplanar CT image reconstructions and MIPs were obtained to evaluate the vascular anatomy.  CONTRAST:  75 mL Isovue 370  COMPARISON:  06/17/2014  FINDINGS: The mid ascending thoracic aorta measures up to 4.2 cm and unchanged. The great vessels are patent.  Atherosclerotic calcifications involving the great vessels without significant stenosis. Again noted is an aortic stent graft located in the proximal descending thoracic aorta and unchanged in position. The stent graft is widely patent. Aortic arch roughly measures up to 3.3 cm and unchanged. The descending thoracic aortic aneurysm sac measures 7.8 x 6.5 cm on sequence 5, image 62 and previously measured 7.4 x 6.4 cm. The delayed images demonstrate a large amount of contrast within the aneurysm sac, predominantly along the anterior and left side of the sac. Findings remain compatible with an endoleak. Stent graft extends down to the aortic hiatus. The thoracic aorta near the hiatus is tortuous and poor apposition of the stent graft along the distal thoracic aortic wall. The configuration of the stent graft in the distal descending thoracic aorta is unchanged. The proximal abdominal aorta measures up to 3.9 cm and unchanged. Mild noncalcified plaque in the proximal SMA without significant stenosis. Celiac trunk and  main branch vessels are patent.  Again noted are enlarged pulmonary arteries, the main pulmonary artery measures up to 3.9 cm and unchanged. No gross abnormality to the thyroid tissue. There is no significant mediastinal or hilar lymphadenopathy. Small amount of fluid in the pericardial recess. No significant pericardial fluid. There is trace left pleural thickening or fluid which is unchanged. No acute abnormality in the upper abdomen.  The trachea and mainstem bronchi are patent. Mild volume loss along the medial right lower lobe. Stable small calcified granuloma in the right lower lobe. Stable punctate nodule in the left lower lobe on sequence 11, image 41. Minimal change since 10/04/2012 and likely benign. Chronic parenchymal scarring in the left upper lobe on sequence 11, image 11. Again noted are small nodular densities along the anterior left upper lobe best seen on sequence 11, image 25 and 27. Some of these small nodules were present on the previous exam but others are new or enlarged. Stable punctate nodule in left upper lobe on image 36. No acute bone abnormality. No acute bone abnormality.  Review of the MIP images confirms the above findings.  IMPRESSION: Enlargement of the thoracic aortic aneurysm sac with a persistent endoleak. The source for the endoleak is not clearly identified.  Increased nodularity along the anterior left upper lobe as described. The configuration is suggestive for an infectious or inflammatory process. Recommend continued follow-up to exclude a neoplastic process. Consider a 3 month follow-up to evaluate the nodularity in this area.   Electronically Signed   By: Markus Daft M.D.   On: 01/20/2015 13:41    Labs:  CBC:  Recent Labs  09/07/14 1132  WBC 5.7  HGB 14.5  HCT 42.4  PLT 217    COAGS:  Recent Labs  09/22/14 1211 11/03/14 1152 12/15/14 1202 01/25/15 1151  INR 2.5 1.9 2.6 3.4    BMP:  Recent Labs  05/04/14 1139 06/14/14 1116 09/07/14 1132  01/18/15 1009 01/18/15 1011  NA  --   --  138  --   --   K  --   --  4.4  --   --   CL  --   --  103  --   --   CO2  --   --  25  --   --   GLUCOSE  --   --  113*  --   --   BUN  --  27* 34* 36*  --   CALCIUM  --   --  9.8  --   --  CREATININE 1.40*  --  1.18*  --  1.20*    LIVER FUNCTION TESTS:  Recent Labs  09/07/14 1132  BILITOT 1.3*  AST 20  ALT 17  ALKPHOS 221*  PROT 7.2  ALBUMIN 4.4    TUMOR MARKERS: No results for input(s): AFPTM, CEA, CA199, CHROMGRNA in the last 8760 hours.  Assessment and Plan:  Very pleasant 81 year old lady with an enlarging thoracic aortic aneurysm despite thoracic endovascular aortic repair secondary to what is likely a progressive type II endoleak.  Her aneurysm has enlarged significantly over the last 6 months and there is a very real risk of rupture. If her aneurysm or to rupture, she likely would not survive any open repair. She is very intimidated by the thought of undergoing an additional procedure as she has had difficulty bouncing back from her most recent procedures given her age and multiple medical comorbidities.  We discussed the concept of direct sac puncture and embolization of the type II endoleak either under general anesthesia or moderate sedation. I was able to answer many of her questions. She still has significant concerns about doing any further procedure.  1.) I will plan to contact her children in the near future either individually, or via a conference call to further discuss her care and help her and her family make a decision as to what to do. I was able to talk with her surgeon Dr. Servando Snare who is in agreement with me that repair of her type II endoleak is likely in her best interest. If we do nothing and her aneurysm sac ruptures, it will likely be a fatal event.   2.) Let's try to arrange a conference call for my next clinic day on 02/15/15.   Thank you for this interesting consult.  I greatly enjoyed meeting [name] [name] and look forward to participating in their care.  A copy of this report was sent to the requesting provider on this date.  SignedJacqulynn Cadet 02/10/2015, 5:59 PM   I spent a total of 30 Minutes   in face to face in clinical consultation, greater than 50% of which was counseling/coordinating care for Type 2 endoleak.

## 2015-02-14 DIAGNOSIS — S42009A Fracture of unspecified part of unspecified clavicle, initial encounter for closed fracture: Secondary | ICD-10-CM

## 2015-02-14 HISTORY — DX: Fracture of unspecified part of unspecified clavicle, initial encounter for closed fracture: S42.009A

## 2015-02-15 ENCOUNTER — Ambulatory Visit (INDEPENDENT_AMBULATORY_CARE_PROVIDER_SITE_OTHER): Payer: Medicare Other | Admitting: Pharmacist

## 2015-02-15 DIAGNOSIS — I482 Chronic atrial fibrillation, unspecified: Secondary | ICD-10-CM

## 2015-02-15 DIAGNOSIS — Z5181 Encounter for therapeutic drug level monitoring: Secondary | ICD-10-CM | POA: Diagnosis not present

## 2015-02-15 DIAGNOSIS — Z7901 Long term (current) use of anticoagulants: Secondary | ICD-10-CM

## 2015-02-15 DIAGNOSIS — I4891 Unspecified atrial fibrillation: Secondary | ICD-10-CM

## 2015-02-15 LAB — POCT INR: INR: 3.1

## 2015-02-22 DIAGNOSIS — S4991XD Unspecified injury of right shoulder and upper arm, subsequent encounter: Secondary | ICD-10-CM | POA: Diagnosis not present

## 2015-02-22 DIAGNOSIS — S42034D Nondisplaced fracture of lateral end of right clavicle, subsequent encounter for fracture with routine healing: Secondary | ICD-10-CM | POA: Diagnosis not present

## 2015-02-22 NOTE — Progress Notes (Signed)
Cardiology Office Note   Date:  02/22/2015   ID:  [name] [name], DOB June 13, 1932, MRN [id number]  PCP:  [name], MD  Cardiologist:   Thayer Headings, MD   Chief Complaint  Patient presents with  . Follow-up    atrial fib   Problem List:  1. Atrial fibrillation-we have tried her on Rythmol, flecainide, Sotalol, and amiodarone. Her insurance company will not pay for [name]. 2. Hypertension 3. Thoracic aortic aneurism Servando Snare) - s/p stent graft repair. Complicated by a type III endoleak. Repaired with another stenting.  4. Pulmonary Hypertension - moderate , est. PA pressures of 55.   August 25, 2014   [name] [name] is a 81 y.o. female who presents for her atrial fib.  Same symptoms.  Still short of breath.  Has lots of fatigue with any exercise. She continues to have Afib.  Has tried multiple meds.  Has had lots of back pain - since her thoracic aortic surgery .  She gets very fatigued with any sort of walking or exercise  February 23, 2015:  Still having lots of back pain - has lost her balance.  Is short of breath also  Has fallen on several occasions  Needs to have vascular surgery later this month.   Her stent graft has an endoleak .    Past Medical History  Diagnosis Date  . AF (atrial fibrillation)   . Hypertension   . Aortic insufficiency   . Mitral regurgitation   . Tricuspid regurgitation   . CHF (congestive heart failure)   . Aortic aneurysm   . CAD (coronary artery disease)   . Arthritis   . Claustrophobia   . Shortness of breath   . Broken neck     40 years ago    Past Surgical History  Procedure Laterality Date  . Tonsillectomy    . Cystectomy      sacrum  . Thoracic aortic endovascular stent graft N/A 10/02/2012    Procedure: THORACIC AORTIC ENDOVASCULAR STENT GRAFT;  Surgeon: Serafina Mitchell, MD;  Location: [facility];  Service: Vascular;  Laterality: N/A;  Ultrasound guided  . Thoracic aortic endovascular stent graft N/A  10/05/2012    Procedure: Endovascular repair of THORACIC AORTIC ANEURYSM;  Surgeon: Serafina Mitchell, MD;  Location: [facility];  Service: Vascular;  Laterality: N/A;  Angioplasty of Aortic throasic aneurysm, x1 aortagram,  Percutaneous acess right femoral artery.     Current Outpatient Prescriptions  Medication Sig Dispense Refill  . acetaminophen (TYLENOL) 500 MG tablet Take 1,000 mg by mouth every 4 (four) hours as needed for pain.     . furosemide (LASIX) 20 MG tablet Take 1 tablet (20 mg total) by mouth daily. 90 tablet 3  . prazosin (MINIPRESS) 2 MG capsule Take 1 capsule (2 mg total) by mouth at bedtime. 30 capsule 11  . propranolol ER (INDERAL LA) 160 MG SR capsule Take 1 capsule (160 mg total) by mouth at bedtime. 90 capsule 3  . spironolactone (ALDACTONE) 25 MG tablet TAKE ONE HALF TABLET BY MOUTH EVERY MORNING (Patient taking differently: Take 12.5 mg by mouth daily. TAKE ONE HALF TABLET BY MOUTH EVERY MORNING) 45 tablet 3  . traMADol (ULTRAM) 50 MG tablet Take 1 tablet (50 mg total) by mouth every 6 (six) hours as needed for moderate pain or severe pain. (Patient not taking: Reported on 02/10/2015) 20 tablet 0  . warfarin (COUMADIN) 5 MG tablet Take 2.5-5 mg by mouth daily. Take  0.5 tablet (2.5mg ) on all days except Wednesday take 1 tablet (5mg )     No current facility-administered medications for this visit.    Allergies:   Atorvastatin; Amiodarone hcl; Diltiazem hcl; Flecainide acetate; Metoprolol succinate; and Sotalol hcl    Social History:  The patient  reports that she quit smoking about 29 years ago. Her smoking use included Cigarettes. She has a 20 pack-year smoking history. She has never used smokeless tobacco. She reports that she drinks about 5.4 oz of alcohol per week. She reports that she does not use illicit drugs.   Family History:  The patient's family history includes Aneurysm (age of onset: 4) in her mother; Cancer in her father and mother; Heart disease in her mother;  Hodgkin's lymphoma (age of onset: 90) in her father; Hypertension in her mother.    ROS:  Please see the history of present illness.    Review of Systems: Constitutional:  denies fever, chills, diaphoresis, appetite change and fatigue.  HEENT: denies photophobia, eye pain, redness, hearing loss, ear pain, congestion, sore throat, rhinorrhea, sneezing, neck pain, neck stiffness and tinnitus.  Respiratory: denies SOB, DOE, cough, chest tightness, and wheezing.  Cardiovascular: denies chest pain, palpitations and leg swelling.  Gastrointestinal: denies nausea, vomiting, abdominal pain, diarrhea, constipation, blood in stool.  Genitourinary: denies dysuria, urgency, frequency, hematuria, flank pain and difficulty urinating.  Musculoskeletal: denies  myalgias, back pain, joint swelling, arthralgias and gait problem.   Skin: denies pallor, rash and wound.  Neurological: denies dizziness, seizures, syncope, weakness, light-headedness, numbness and headaches.   Hematological: denies adenopathy, easy bruising, personal or family bleeding history.  Psychiatric/ Behavioral: denies suicidal ideation, mood changes, confusion, nervousness, sleep disturbance and agitation.       All other systems are reviewed and negative.    PHYSICAL EXAM: VS:  There were no vitals taken for this visit. , BMI There is no weight on file to calculate BMI. GEN: Well nourished, well developed, in no acute distress HEENT: normal Neck: no JVD, carotid bruits, or masses Cardiac: irreg. Irreg. ; 2/6 diastolic murmur ,  ,no edema  Respiratory:  clear to auscultation bilaterally, normal work of breathing GI: soft, nontender, nondistended, + BS MS: no deformity or atrophy Skin: warm and dry, no rash Neuro:  Strength and sensation are intact Psych: normal   EKG:  EKG is ordered today. The ekg ordered today demonstrates atrial fib with rate of 79.    Recent Labs: 09/07/2014: ALT 17; Hemoglobin 14.5; Platelets 217;  Potassium 4.4; Sodium 138 01/18/2015: BUN 36*; Creat 1.20*    Lipid Panel    Component Value Date/Time   CHOL 204* 09/07/2014 1132   TRIG 64 09/07/2014 1132   HDL 55 09/07/2014 1132   CHOLHDL 3.7 09/07/2014 1132   VLDL 13 09/07/2014 1132   LDLCALC 136* 09/07/2014 1132   LDLDIRECT 127.6 08/18/2012 1011      Wt Readings from Last 3 Encounters:  02/10/15 52.164 kg (115 lb)  02/02/15 52.617 kg (116 lb)  01/20/15 52.164 kg (115 lb)      Other studies Reviewed: Additional studies/ records that were reviewed today include: . Review of the above records demonstrates:    ASSESSMENT AND PLAN:  1. Atrial fibrillation-we have tried her on Rythmol, flecainide, Sotalol, and amiodarone. Her insurance company will not pay for [name].  Her rate is well controlled. Continue current meds.   2. Hypertension - she has been on her BP meds for years.  Will continue   3. Thoracic  aortic aneurism Servando Snare) - s/p stent graft repair. Complicated by a type III endoleak. Repaired with another stenting.  She continues to have some back pain .  Will discuss with Dr. Servando Snare at next apt.    4. Pulmonary Hypertension - moderate , est. PA pressures of 55.  Advised her to limit her salt    Current medicines are reviewed at length with the patient today.  The patient does not have concerns regarding medicines.  The following changes have been made:  no change   Disposition:   FU with me in 6 months .     Signed, Nahser, Wonda Cheng, MD  02/22/2015 1:35 PM    [facility] [place], [place], [place]  [address] Phone: [phone]; Fax: [phone]

## 2015-02-23 ENCOUNTER — Encounter: Payer: Self-pay | Admitting: Cardiovascular Disease

## 2015-02-23 ENCOUNTER — Other Ambulatory Visit (HOSPITAL_COMMUNITY): Payer: Self-pay | Admitting: Interventional Radiology

## 2015-02-23 ENCOUNTER — Ambulatory Visit (INDEPENDENT_AMBULATORY_CARE_PROVIDER_SITE_OTHER): Payer: Medicare Other | Admitting: Cardiovascular Disease

## 2015-02-23 VITALS — BP 102/64 | HR 66 | Ht 62.0 in | Wt 116.6 lb

## 2015-02-23 DIAGNOSIS — I482 Chronic atrial fibrillation, unspecified: Secondary | ICD-10-CM

## 2015-02-23 DIAGNOSIS — T82330A Leakage of aortic (bifurcation) graft (replacement), initial encounter: Secondary | ICD-10-CM

## 2015-02-23 DIAGNOSIS — I10 Essential (primary) hypertension: Secondary | ICD-10-CM | POA: Diagnosis not present

## 2015-02-23 DIAGNOSIS — IMO0001 Reserved for inherently not codable concepts without codable children: Secondary | ICD-10-CM

## 2015-02-23 DIAGNOSIS — I712 Thoracic aortic aneurysm, without rupture, unspecified: Secondary | ICD-10-CM

## 2015-02-23 NOTE — Patient Instructions (Signed)
Medication Instructions:  Your physician recommends that you continue on your current medications as directed. Please refer to the Current Medication list given to you today.   Labwork: None Ordered   Testing/Procedures: None Ordered   Follow-Up: Your physician wants you to follow-up in: 6 months with Dr. Nahser.  You will receive a reminder letter in the mail two months in advance. If you don't receive a letter, please call our office to schedule the follow-up appointment.     

## 2015-03-01 ENCOUNTER — Ambulatory Visit (INDEPENDENT_AMBULATORY_CARE_PROVIDER_SITE_OTHER): Payer: Medicare Other | Admitting: Family Medicine

## 2015-03-01 ENCOUNTER — Telehealth: Payer: Self-pay

## 2015-03-01 ENCOUNTER — Encounter: Payer: Self-pay | Admitting: Family Medicine

## 2015-03-01 ENCOUNTER — Ambulatory Visit: Payer: Self-pay | Admitting: Family Medicine

## 2015-03-01 VITALS — BP 130/70 | HR 88 | Temp 98.8°F | Wt 114.2 lb

## 2015-03-01 DIAGNOSIS — I481 Persistent atrial fibrillation: Secondary | ICD-10-CM | POA: Diagnosis not present

## 2015-03-01 DIAGNOSIS — J069 Acute upper respiratory infection, unspecified: Secondary | ICD-10-CM | POA: Diagnosis not present

## 2015-03-01 DIAGNOSIS — I10 Essential (primary) hypertension: Secondary | ICD-10-CM

## 2015-03-01 DIAGNOSIS — B9789 Other viral agents as the cause of diseases classified elsewhere: Principal | ICD-10-CM

## 2015-03-01 DIAGNOSIS — R350 Frequency of micturition: Secondary | ICD-10-CM | POA: Diagnosis not present

## 2015-03-01 DIAGNOSIS — M549 Dorsalgia, unspecified: Secondary | ICD-10-CM

## 2015-03-01 DIAGNOSIS — G8929 Other chronic pain: Secondary | ICD-10-CM | POA: Diagnosis not present

## 2015-03-01 DIAGNOSIS — S42001D Fracture of unspecified part of right clavicle, subsequent encounter for fracture with routine healing: Secondary | ICD-10-CM

## 2015-03-01 DIAGNOSIS — I4819 Other persistent atrial fibrillation: Secondary | ICD-10-CM

## 2015-03-01 NOTE — Patient Instructions (Signed)
Use your cough medicine and if he needs something for night try NyQuil . If this about a week and if you not any better give me a call

## 2015-03-01 NOTE — Progress Notes (Signed)
   Subjective:    Patient ID: [name] [name], female    DOB: 1932-04-22, 81 y.o.   MRN: [id number]  HPI She has a three-day history of started with malaise, weakness, vomiting, sore throat followed yesterday by a cough. No earache, fever or chills. He has a long history of back pain but he got worse especially on the right-hand side. Prior to this the pain was only with motion now which with sitting or with motion. She has not tried any NSAID because of history of previous ulcer. She has had previous back evaluations with no definitive diagnosis. She has had a previous thoracic aortic aneurysm with a stent that needs to be repaired and is scheduled this 30th.She also has had a recent right clavicle fracture. She is not having any difficulty with that. She also states that she continues to have difficulty with urinary frequency.She does have a long history of difficulty with atrial fibrillation and has a history of hypertension. Review of Systems     Objective:   Physical Exam Alert and in no distress. Tympanic membranes and canals are normal. Pharyngeal area is normal. Neck is supple without adenopathy or thyromegaly. Cardiac exam shows an Irregular rhythm without murmurs or gallops. Lungs are clear to auscultation.Exam of the right clavicle shows no tenderness or obvious deformity. She was unable to produce a urine specimen.        Assessment & Plan:  Viral URI with cough  Frequent urination - Plan: CANCELED: POCT Urinalysis Dipstick  Persistent atrial fibrillation  Essential hypertension  Chronic back pain  Right clavicle fracture, with routine healing, subsequent encounter I explained that she is really having viral symptoms right now and conservative care would be appropriate. If she continues to have difficulty next week she is to call me. She will continue on her present medication regimen. No therapy needed for the clavicle fracture as she is not having any trouble with them.  Explained that her back pain might possibly be related to her need for surgical intervention. We will wait and see if her pain goes away after the surgery.

## 2015-03-01 NOTE — Telephone Encounter (Signed)
Patient was seen today with back pain and frequent urination she couldn't leave a U/A so i gave her a cup if things did not get better she could bring the sample by so we could test

## 2015-03-03 ENCOUNTER — Ambulatory Visit (INDEPENDENT_AMBULATORY_CARE_PROVIDER_SITE_OTHER): Payer: Medicare Other | Admitting: Family Medicine

## 2015-03-03 ENCOUNTER — Encounter: Payer: Self-pay | Admitting: Family Medicine

## 2015-03-03 VITALS — BP 112/78 | HR 72 | Temp 98.2°F | Ht 62.76 in | Wt 112.2 lb

## 2015-03-03 DIAGNOSIS — R05 Cough: Secondary | ICD-10-CM | POA: Diagnosis not present

## 2015-03-03 DIAGNOSIS — H1033 Unspecified acute conjunctivitis, bilateral: Secondary | ICD-10-CM | POA: Diagnosis not present

## 2015-03-03 DIAGNOSIS — R112 Nausea with vomiting, unspecified: Secondary | ICD-10-CM | POA: Diagnosis not present

## 2015-03-03 DIAGNOSIS — R059 Cough, unspecified: Secondary | ICD-10-CM

## 2015-03-03 DIAGNOSIS — B349 Viral infection, unspecified: Secondary | ICD-10-CM

## 2015-03-03 MED ORDER — POLYMYXIN B-TRIMETHOPRIM 10000-0.1 UNIT/ML-% OP SOLN: 1.0000 [drp] | OPHTHALMIC | Status: DC

## 2015-03-03 MED ORDER — ONDANSETRON 4 MG PO TBDP: 4.0000 mg | ORAL_TABLET | Freq: Three times a day (TID) | ORAL | Status: DC | PRN

## 2015-03-03 NOTE — Progress Notes (Signed)
Chief Complaint  Patient presents with  . Cough    5 days hx of fatigue, cough, loss of appetite, nausea with dry heaves and both are have drainage, goopy and sealed shut in the morning. Mucus is white in color. Saw JCL this past Tuesday. Having surgery Aug 31st.    5 days ago, started with malaise, weakness, vomiting, cough.  Yesterday she started with eyes crusting shut in the mornings, and draining white goop.  Denies itchy eyes, they just feel tired, sore.  Denies any vision changes.   Denies any eye injury, trauma, foreign body sensation.  Denies light sensitivity  Cough has gotten worse--feels like there is phlegm but she isn't able to expectorate it.  She has nasal congestion, productive of yellow-gray mucus.  Denies ear pain, sinus pain or headaches.  +sore throat. She has been using an OTC DM medication.  She is complaining of a lot of nausea, decreased appetite. The sight of food has made her dry heave. Eating only some yogurt. Denies any diarrhea. Feels weak from not eating.  No known sick contacts  She has surgery scheduled for the end of the month for leaky thoracic aortic stent, and needs to be better by then.  PMH, PSH, SH reviewed.  Outpatient Encounter Prescriptions as of 03/03/2015  Medication Sig Note  . furosemide (LASIX) 20 MG tablet Take 1 tablet (20 mg total) by mouth daily.   . prazosin (MINIPRESS) 2 MG capsule Take 1 capsule (2 mg total) by mouth at bedtime.   . propranolol ER (INDERAL LA) 160 MG SR capsule Take 1 capsule (160 mg total) by mouth at bedtime.   Marland Kitchen spironolactone (ALDACTONE) 25 MG tablet TAKE ONE HALF TABLET BY MOUTH EVERY MORNING (Patient taking differently: Take 12.5 mg by mouth daily. TAKE ONE HALF TABLET BY MOUTH EVERY MORNING)   . warfarin (COUMADIN) 5 MG tablet Take 2.5-5 mg by mouth daily. Take 0.5 tablet (2.5mg ) on all days except Wednesday take 1 tablet (5mg )   . acetaminophen (TYLENOL) 500 MG tablet Take 1,000 mg by mouth every 4 (four) hours  as needed for pain.    Marland Kitchen ondansetron (ZOFRAN ODT) 4 MG disintegrating tablet Take 1 tablet (4 mg total) by mouth every 8 (eight) hours as needed for nausea or vomiting.   . traMADol (ULTRAM) 50 MG tablet TK 1 T PO Q 6 H PRN P 03/03/2015: Received from: External Pharmacy  . trimethoprim-polymyxin b (POLYTRIM) ophthalmic solution Place 1-2 drops into both eyes every 4 (four) hours.    No facility-administered encounter medications on file as of 03/03/2015.   (polytrim added today, not prior to visit)  Allergies  Allergen Reactions  . Atorvastatin     Muscle aches  . Amiodarone Hcl     REACTION: Nausea/Vomiting; decrease appetite; affects liver enzymes  . Diltiazem Hcl     REACTION: Nausea/vomitting  . Flecainide Acetate     REACTION: Intol: nausea/horrible feeling  . Metoprolol Succinate     REACTION: Nausea/vomiting;dizziness  . Sotalol Hcl     REACTION: Nausea/vomiting   ROS:  Denies fevers, chills, headaches, dizziness, chest pain, palpitations, shortness of breath.  +cough, nausea, vomiting. No bowel changes, bleeding, bruising, rash, arthralgias.  Last seen by me for clavicle fracture--feeling much better.   PHYSICAL EXAM: BP 112/78 mmHg  Pulse 72  Temp(Src) 98.2 F (36.8 C) (Tympanic)  Ht 5' 2.76" (1.594 m)  Wt 112 lb 3.2 oz (50.894 kg)  BMI 20.03 kg/m2   Well appearing, pleasant elderly  female in no distress. No coughing or sniffling HEENT: PERRL, EOMI, TM's and EAC's normal.  Conjunctiva is mildly injected bilaterally with some white drainage and crusting noted medially bilaterally.. Nasal mucosa is mildly edematous with thick white mucus noted in both nares. Sinuses are nontender. OP is clear with moist mucus membranes. Neck: no lymphadenopathy or mass Heart: irregularly irregular Lungs: clear bilaterally Abdomen: mild epigastric tenderness; soft, no masses, normal bowel sounds Skin: no rashes Neuro: alert and oriented. Cranial nerves intact. Normal strength, gait  (uses cane).  ASSESSMENT/PLAN:  Non-intractable vomiting with nausea, vomiting of unspecified type - Plan: ondansetron (ZOFRAN ODT) 4 MG disintegrating tablet  Conjunctivitis, acute, bilateral - Plan: trimethoprim-polymyxin b (POLYTRIM) ophthalmic solution  Viral syndrome  Cough  Pinkeye--suspect viral; cover for bacterial. Treat with Polytrim every 4 hours while awake (4-5x/day). Precautions reviewed, as still contagious, and likely viral   Drink plenty of fluids to help keep your secretions thin. Use the eye drops as directed--know that this only treats bacterial infections, and not viral infections so you likely still will be contagious. Try not to touch your eyes, and wash your hands frequently.  I recommend taking guaifenesin--expectorant to help keep the mucus thin. This is found in Mucinex, Robitussin and other products. I recommend getting a 12 hour Mucinex, so that you only needed to take this medication just twice daily (the liquids and other forms sometimes need to be taken every 4-6 hours). If you continue have significant cough, you can take Delsym syrup along with the mucinex (vs getting a medication that contains both guaifenesin and dextromethorphan--Mucinex DM or Robitussin DM).  Use the zofran as needed for nausea/vomiting.  Call Dr. Redmond School (or me) on Monday if you are getting worse rather than better, having fevers or discolored mucus.

## 2015-03-03 NOTE — Patient Instructions (Signed)
  Drink plenty of fluids to help keep your secretions thin. Use the eye drops as directed--know that this only treats bacterial infections, and not viral infections so you likely still will be contagious. Try not to touch your eyes, and wash your hands frequently.  I recommend taking guaifenesin--expectorant to help keep the mucus thin. This is found in Mucinex, Robitussin and other products. I recommend getting a 12 hour Mucinex, so that you only needed to take this medication just twice daily (the liquids and other forms sometimes need to be taken every 4-6 hours). If you continue have significant cough, you can take Delsym syrup along with the mucinex (vs getting a medication that contains both guaifenesin and dextromethorphan--Mucinex DM or Robitussin DM).  Use the zofran as needed for nausea/vomiting.  Call Dr. Redmond School (or me) on Monday if you are getting worse rather than better, having fevers or discolored mucus.

## 2015-03-04 ENCOUNTER — Ambulatory Visit: Payer: Medicare Other | Admitting: Family Medicine

## 2015-03-07 ENCOUNTER — Telehealth: Payer: Self-pay | Admitting: *Deleted

## 2015-03-07 NOTE — Telephone Encounter (Signed)
Patient called and stated that she is not worse but not much better either )I saw your note and told her to call if she was worsening). She has had no fevers, no discolored mucus, she did have a coughing spell and thinks she may have pulled a muscle in her left rib area. Please advise.

## 2015-03-07 NOTE — Telephone Encounter (Signed)
Called back and advised her and she states she wants to wait it out another day or so and will call back for appt if still not better by end of the week.

## 2015-03-07 NOTE — Telephone Encounter (Signed)
Spoke with patient and she is taking mucinex but it is not helping her to expectorate anything. Cannot get mucus out, it is white/clear in color. Nausea is better as well as her pink eye. I told her to call back towards the end of the week if not better-she was not happy with this. She then told me that she is so fatigued that she can hardly do one thing without having to go sit down and take a rest. She seems to lose her balance and is having difficult time walking. I told her I would let you know about these symptoms as well.

## 2015-03-07 NOTE — Telephone Encounter (Signed)
If that symptomatic from fatigue, needs eval (pulse ox, possibly labs, other eval).  Schedule with Dr Redmond School on Tuesday (or with me on Wed if she can't come tomorrow)

## 2015-03-07 NOTE — Telephone Encounter (Signed)
Left message for patient to return my call to go over Dr.Knapp's recommedations.

## 2015-03-07 NOTE — Telephone Encounter (Signed)
If no fevers, discolored mucus, shortness of breath or WORSENING symptoms, keep up the same plan. Is nausea/vomiting better? Is she expectorating phlegm since taking the mucinex (and if so, ensure not yellow-green). Is her pinkeye better/improving?  She still has all this week to continue to improve before her surgery

## 2015-03-09 ENCOUNTER — Ambulatory Visit (INDEPENDENT_AMBULATORY_CARE_PROVIDER_SITE_OTHER): Payer: Medicare Other | Admitting: *Deleted

## 2015-03-09 DIAGNOSIS — Z5181 Encounter for therapeutic drug level monitoring: Secondary | ICD-10-CM | POA: Diagnosis not present

## 2015-03-09 DIAGNOSIS — I4891 Unspecified atrial fibrillation: Secondary | ICD-10-CM

## 2015-03-09 DIAGNOSIS — I481 Persistent atrial fibrillation: Secondary | ICD-10-CM

## 2015-03-09 DIAGNOSIS — Z7901 Long term (current) use of anticoagulants: Secondary | ICD-10-CM | POA: Diagnosis not present

## 2015-03-09 DIAGNOSIS — I4819 Other persistent atrial fibrillation: Secondary | ICD-10-CM

## 2015-03-09 LAB — POCT INR: INR: 4.2

## 2015-03-09 MED ORDER — ENOXAPARIN SODIUM 60 MG/0.6ML ~~LOC~~ SOLN: 60.0000 mg | SUBCUTANEOUS | Status: DC

## 2015-03-09 NOTE — Patient Instructions (Signed)
03/09/2015 no coumadin 03/10/2015 no coumadin 03/11/2015 no coumadin 03/12/2015 no coumadin Lovenox 60 mg 9am 03/13/2015 no coumadin Lovenox 60 mg 9am 03/14/2015 no coumadin Lovenox 60mg  9am 03/15/2015 no coumadin Lovenox 60mg  9am 03/16/2015 no coumadin no lovenox day of procedure then after procedure ask Dr Laurence Ferrari when to restart Coumadin and Lovenox restart at same dose Lovenox 60 mg daily and coumadin 1/2 tablet every day  Continue Lovenox 60 mg daily and coumadin 1/2 tablet daily until seen in coumadin clinic on 03/22/2015

## 2015-03-11 ENCOUNTER — Encounter (HOSPITAL_COMMUNITY): Payer: Self-pay

## 2015-03-11 ENCOUNTER — Encounter (HOSPITAL_COMMUNITY)
Admission: RE | Admit: 2015-03-11 | Discharge: 2015-03-11 | Disposition: A | Discharge status: Home / Self Care | Payer: Medicare Other | Source: Ambulatory Visit | Attending: Interventional Radiology | Admitting: Interventional Radiology

## 2015-03-11 DIAGNOSIS — J449 Chronic obstructive pulmonary disease, unspecified: Secondary | ICD-10-CM | POA: Insufficient documentation

## 2015-03-11 DIAGNOSIS — I509 Heart failure, unspecified: Secondary | ICD-10-CM | POA: Insufficient documentation

## 2015-03-11 DIAGNOSIS — Z01818 Encounter for other preprocedural examination: Secondary | ICD-10-CM | POA: Diagnosis not present

## 2015-03-11 DIAGNOSIS — I10 Essential (primary) hypertension: Secondary | ICD-10-CM | POA: Insufficient documentation

## 2015-03-11 DIAGNOSIS — Z01812 Encounter for preprocedural laboratory examination: Secondary | ICD-10-CM | POA: Insufficient documentation

## 2015-03-11 DIAGNOSIS — I4891 Unspecified atrial fibrillation: Secondary | ICD-10-CM | POA: Insufficient documentation

## 2015-03-11 DIAGNOSIS — Z79899 Other long term (current) drug therapy: Secondary | ICD-10-CM | POA: Insufficient documentation

## 2015-03-11 DIAGNOSIS — K219 Gastro-esophageal reflux disease without esophagitis: Secondary | ICD-10-CM | POA: Diagnosis present

## 2015-03-11 DIAGNOSIS — I2510 Atherosclerotic heart disease of native coronary artery without angina pectoris: Secondary | ICD-10-CM | POA: Insufficient documentation

## 2015-03-11 DIAGNOSIS — Z7901 Long term (current) use of anticoagulants: Secondary | ICD-10-CM | POA: Diagnosis not present

## 2015-03-11 DIAGNOSIS — I272 Other secondary pulmonary hypertension: Secondary | ICD-10-CM | POA: Diagnosis not present

## 2015-03-11 DIAGNOSIS — I712 Thoracic aortic aneurysm, without rupture: Secondary | ICD-10-CM | POA: Insufficient documentation

## 2015-03-11 DIAGNOSIS — Z87891 Personal history of nicotine dependence: Secondary | ICD-10-CM | POA: Insufficient documentation

## 2015-03-11 HISTORY — DX: Cardiac murmur, unspecified: R01.1

## 2015-03-11 HISTORY — DX: Frequency of micturition: R35.0

## 2015-03-11 HISTORY — DX: Fracture of unspecified part of unspecified clavicle, initial encounter for closed fracture: S42.009A

## 2015-03-11 HISTORY — DX: Anemia, unspecified: D64.9

## 2015-03-11 HISTORY — DX: Personal history of other diseases of the respiratory system: Z87.09

## 2015-03-11 HISTORY — DX: Cardiac arrhythmia, unspecified: I49.9

## 2015-03-11 HISTORY — DX: Gastro-esophageal reflux disease without esophagitis: K21.9

## 2015-03-11 HISTORY — DX: Chronic obstructive pulmonary disease, unspecified: J44.9

## 2015-03-11 HISTORY — DX: Presence of spectacles and contact lenses: Z97.3

## 2015-03-11 HISTORY — DX: Unspecified urinary incontinence: R32

## 2015-03-11 HISTORY — DX: Personal history of pneumonia (recurrent): Z87.01

## 2015-03-11 LAB — BASIC METABOLIC PANEL WITH GFR
Anion gap: 11 (ref 5–15)
BUN: 26 mg/dL — ABNORMAL HIGH (ref 6–20)
CO2: 26 mmol/L (ref 22–32)
Calcium: 9.3 mg/dL (ref 8.9–10.3)
Chloride: 97 mmol/L — ABNORMAL LOW (ref 101–111)
Creatinine, Ser: 1.14 mg/dL — ABNORMAL HIGH (ref 0.44–1.00)
GFR calc Af Amer: 51 mL/min — ABNORMAL LOW (ref >60)
GFR calc non Af Amer: 44 mL/min — ABNORMAL LOW (ref >60)
Glucose, Bld: 122 mg/dL — ABNORMAL HIGH (ref 65–99)
Potassium: 3.6 mmol/L (ref 3.5–5.1)
Sodium: 134 mmol/L — ABNORMAL LOW (ref 135–145)

## 2015-03-11 LAB — CBC
HCT: 42.4 % (ref 36.0–46.0)
HEMOGLOBIN: 14.1 g/dL (ref 12.0–15.0)
MCH: 32.6 pg (ref 26.0–34.0)
MCHC: 33.3 g/dL (ref 30.0–36.0)
MCV: 98.1 fL (ref 78.0–100.0)
Platelets: 342 10*3/uL (ref 150–400)
RBC: 4.32 MIL/uL (ref 3.87–5.11)
RDW: 14 % (ref 11.5–15.5)
WBC: 13.1 10*3/uL — ABNORMAL HIGH (ref 4.0–10.5)

## 2015-03-11 LAB — SURGICAL PCR SCREEN
MRSA, PCR: NEGATIVE
Staphylococcus aureus: NEGATIVE

## 2015-03-11 LAB — PROTIME-INR
INR: 2.29 — AB (ref 0.00–1.49)
PROTHROMBIN TIME: 25 s — AB (ref 11.6–15.2)

## 2015-03-11 NOTE — Progress Notes (Signed)
PCP - Dr. Jill Alexanders Cardiologist - [name]  EKG- February 2016 - Epic CXR - denies Echo - April 2015 - Epic Stress Test - April 2015

## 2015-03-11 NOTE — Pre-Procedure Instructions (Addendum)
    [name] [name]  03/11/2015      [id number] [address], [place] [name] [address] Phone: [phone] Fax: [place] [address] - [name] [address] Phone: [phone] Fax: [phone]    Your procedure is scheduled on Wednesday, August 31st, 2016.   Report to [facility] Admitting at 10:45 A.M.  Call this number if you have problems the morning of surgery:  [phone]   Remember:  Do not eat food or drink liquids after midnight.   Take these medicines the morning of surgery with A SIP OF WATER: Acetaminophen (Tylenol) if needed, Ondansetron (Zofran) if needed, Tramadol (Ultram) if needed, Eye drops.  Stop taking: Coumadin, NSAIDS, Aspirin, Aleve, Naproxen, BC's, Goody's, Ibuprofen, Fish Oil, all herbal medications, and all vitamins.   Please follow doctor's instructions about Lovenox.    Do not wear jewelry, make-up or nail polish.  Do not wear lotions, powders, or perfumes.  You may NOT wear deodorant.  Do not shave 48 hours prior to surgery.    Do not bring valuables to the hospital.  [facility] is not responsible for any belongings or valuables.  Contacts, dentures or bridgework may not be worn into surgery.  Leave your suitcase in the car.  After surgery it may be brought to your room.  For patients admitted to the hospital, discharge time will be determined by your treatment team.  Patients discharged the day of surgery will not be allowed to drive home.   Special instructions:  See attached.   Please read over the following fact sheets that you were given. Pain Booklet, Coughing and Deep Breathing, MRSA Information and Surgical Site Infection Prevention

## 2015-03-11 NOTE — Progress Notes (Signed)
Patient denies shortness of breath and chest pain at PAT appointment.  Patient states that she is feeling much better than she felt a few days ago and she gets better each day.  Patient stated that she had a dry cough, weakness, and vomiting.  Patient denies a fever.

## 2015-03-11 NOTE — Progress Notes (Signed)
Anesthesia Chart Review:  Pt is 81 year old female scheduled for endoleak repair for thoracic aortic aneurysm on 03/16/2015 with Dr. Shellia Cleverly.   Cardiologist is Dr. Acie Fredrickson. PCP is Dr. Jill Alexanders.   PMH includes: CAD, CHF, atrial fibrillation, HTN, mitral regurgitation, aortic insufficiency, tricuspid regurgitation, aortic aneurysm, COPD, anemia, pulmonary hypertension, GERD.  Former smoker. BMI 20. S/p endovascular repair of thoracic aortic aneurysm 10/05/12. S/p thoracic aortic endovascular stent graft 10/02/12.   Medications include: coumadin, lovenox, lasix, prazosin, propranolol, spironolactone. Pt has stopped coumadin. To start lovenox 03/12/15.   Preoperative labs reviewed.  PT 25. Will repeat DOS.   EKG 08/25/2014: atrial fibrillation with premature aberrantly conducted complexes. Abnormal QRS-T angle, consider primary T wave abnormality. Prolonged QT.   Nuclear stress test 11/09/2013: -Low risk stress nuclear study with no area of significant ischemia identified. LV Ejection Fraction: Study not gated. LV Wall Motion: Study not gated secondary to atrial fibrillation.  Echo 11/09/2013: - Left ventricle: The cavity size was normal. There was moderate concentric hypertrophy. Systolic function was normal. The estimated ejection fraction was in the range of 55% to 60%. Wall motion was normal; there were no regional wall motion abnormalities. The study is not technically sufficient to allow evaluation of LV diastolic function. - Aortic valve: Trileaflet; mildly calcified leaflets. Transvalvular velocity was within the normal range. There was no stenosis. Mild regurgitation. - Mitral valve: Posterior leaflet tethering. Moderate regurgitation. - Left atrium: Massively dilated (90 ml/m2). - Right atrium: Massively dilated (36 cm2). - Atrial septum: A septal defect cannot be excluded. - Tricuspid valve: Dilated annulus - 4.9 cm. Moderate regurgitation. - Pulmonary arteries: PA peak pressure:  36mm Hg (S). - Inferior vena cava: The vessel was normal in size; the respirophasic diameter changes were in the normal range (= 50%); findings are consistent with normal central venous pressure. - Pericardium, extracardiac: There was no pericardial effusion.  Carotid duplex US 09/15/2012: 1-39% B ICA stenosis  If follow-up PT is reasonable and no significant change in her status then would anticipate she could proceed as planned. She will be evaluated by her assigned anesthesiologist on the day of surgery.  [name], FNP-BC [facility]/Anesthesiology Phone: [phone] 03/11/2015 4:45 PM

## 2015-03-15 ENCOUNTER — Other Ambulatory Visit: Payer: Self-pay | Admitting: Radiology

## 2015-03-16 ENCOUNTER — Encounter (HOSPITAL_COMMUNITY): Payer: Self-pay

## 2015-03-16 ENCOUNTER — Encounter (HOSPITAL_COMMUNITY): Payer: Self-pay | Admitting: General Practice

## 2015-03-16 ENCOUNTER — Inpatient Hospital Stay (HOSPITAL_COMMUNITY): Payer: Medicare Other | Admitting: Emergency Medicine

## 2015-03-16 ENCOUNTER — Observation Stay (HOSPITAL_COMMUNITY)
Admission: RE | Admit: 2015-03-16 | Discharge: 2015-03-17 | Disposition: A | Discharge status: Home / Self Care | Payer: Medicare Other | Source: Ambulatory Visit | Attending: Interventional Radiology | Admitting: Interventional Radiology

## 2015-03-16 ENCOUNTER — Ambulatory Visit (HOSPITAL_COMMUNITY)
Admission: RE | Admit: 2015-03-16 | Discharge: 2015-03-16 | Disposition: A | Discharge status: Home / Self Care | Payer: Medicare Other | Source: Ambulatory Visit | Attending: Interventional Radiology | Admitting: Interventional Radiology

## 2015-03-16 ENCOUNTER — Inpatient Hospital Stay (HOSPITAL_COMMUNITY): Payer: Medicare Other | Admitting: Anesthesiology

## 2015-03-16 ENCOUNTER — Inpatient Hospital Stay (HOSPITAL_COMMUNITY): Payer: Medicare Other

## 2015-03-16 ENCOUNTER — Encounter (HOSPITAL_COMMUNITY)
Admission: RE | Disposition: A | Discharge status: Home / Self Care | Payer: Self-pay | Source: Ambulatory Visit | Attending: Interventional Radiology

## 2015-03-16 DIAGNOSIS — I712 Thoracic aortic aneurysm, without rupture, unspecified: Secondary | ICD-10-CM

## 2015-03-16 DIAGNOSIS — F40240 Claustrophobia: Secondary | ICD-10-CM | POA: Insufficient documentation

## 2015-03-16 DIAGNOSIS — Z87891 Personal history of nicotine dependence: Secondary | ICD-10-CM | POA: Diagnosis not present

## 2015-03-16 DIAGNOSIS — I2510 Atherosclerotic heart disease of native coronary artery without angina pectoris: Secondary | ICD-10-CM | POA: Diagnosis not present

## 2015-03-16 DIAGNOSIS — Z7901 Long term (current) use of anticoagulants: Secondary | ICD-10-CM | POA: Insufficient documentation

## 2015-03-16 DIAGNOSIS — I10 Essential (primary) hypertension: Secondary | ICD-10-CM | POA: Insufficient documentation

## 2015-03-16 DIAGNOSIS — Z8249 Family history of ischemic heart disease and other diseases of the circulatory system: Secondary | ICD-10-CM | POA: Insufficient documentation

## 2015-03-16 DIAGNOSIS — R32 Unspecified urinary incontinence: Secondary | ICD-10-CM | POA: Insufficient documentation

## 2015-03-16 DIAGNOSIS — Z79899 Other long term (current) drug therapy: Secondary | ICD-10-CM | POA: Insufficient documentation

## 2015-03-16 DIAGNOSIS — I4891 Unspecified atrial fibrillation: Secondary | ICD-10-CM | POA: Diagnosis not present

## 2015-03-16 DIAGNOSIS — R0602 Shortness of breath: Secondary | ICD-10-CM | POA: Diagnosis not present

## 2015-03-16 DIAGNOSIS — T82330A Leakage of aortic (bifurcation) graft (replacement), initial encounter: Secondary | ICD-10-CM | POA: Insufficient documentation

## 2015-03-16 DIAGNOSIS — M1990 Unspecified osteoarthritis, unspecified site: Secondary | ICD-10-CM | POA: Diagnosis not present

## 2015-03-16 DIAGNOSIS — M549 Dorsalgia, unspecified: Secondary | ICD-10-CM | POA: Diagnosis not present

## 2015-03-16 DIAGNOSIS — IMO0002 Reserved for concepts with insufficient information to code with codable children: Secondary | ICD-10-CM | POA: Insufficient documentation

## 2015-03-16 DIAGNOSIS — Z7902 Long term (current) use of antithrombotics/antiplatelets: Secondary | ICD-10-CM | POA: Insufficient documentation

## 2015-03-16 DIAGNOSIS — I711 Thoracic aortic aneurysm, ruptured: Secondary | ICD-10-CM | POA: Diagnosis not present

## 2015-03-16 DIAGNOSIS — I509 Heart failure, unspecified: Secondary | ICD-10-CM | POA: Diagnosis not present

## 2015-03-16 DIAGNOSIS — Y832 Surgical operation with anastomosis, bypass or graft as the cause of abnormal reaction of the patient, or of later complication, without mention of misadventure at the time of the procedure: Secondary | ICD-10-CM | POA: Insufficient documentation

## 2015-03-16 DIAGNOSIS — IMO0001 Reserved for inherently not codable concepts without codable children: Secondary | ICD-10-CM

## 2015-03-16 DIAGNOSIS — J449 Chronic obstructive pulmonary disease, unspecified: Secondary | ICD-10-CM | POA: Diagnosis not present

## 2015-03-16 DIAGNOSIS — D649 Anemia, unspecified: Secondary | ICD-10-CM | POA: Diagnosis not present

## 2015-03-16 DIAGNOSIS — I083 Combined rheumatic disorders of mitral, aortic and tricuspid valves: Secondary | ICD-10-CM | POA: Insufficient documentation

## 2015-03-16 DIAGNOSIS — K219 Gastro-esophageal reflux disease without esophagitis: Secondary | ICD-10-CM | POA: Diagnosis not present

## 2015-03-16 DIAGNOSIS — Z01811 Encounter for preprocedural respiratory examination: Secondary | ICD-10-CM

## 2015-03-16 DIAGNOSIS — G8929 Other chronic pain: Secondary | ICD-10-CM | POA: Diagnosis not present

## 2015-03-16 DIAGNOSIS — R05 Cough: Secondary | ICD-10-CM | POA: Diagnosis not present

## 2015-03-16 HISTORY — PX: RADIOLOGY WITH ANESTHESIA: SHX6223

## 2015-03-16 LAB — CBC WITH DIFFERENTIAL/PLATELET
BASOS ABS: 0 10*3/uL (ref 0.0–0.1)
BASOS PCT: 0 % (ref 0–1)
EOS ABS: 0 10*3/uL (ref 0.0–0.7)
EOS PCT: 0 % (ref 0–5)
HCT: 37.9 % (ref 36.0–46.0)
Hemoglobin: 12.5 g/dL (ref 12.0–15.0)
Lymphocytes Relative: 10 % — ABNORMAL LOW (ref 12–46)
Lymphs Abs: 0.9 10*3/uL (ref 0.7–4.0)
MCH: 32.4 pg (ref 26.0–34.0)
MCHC: 33 g/dL (ref 30.0–36.0)
MCV: 98.2 fL (ref 78.0–100.0)
MONO ABS: 0.8 10*3/uL (ref 0.1–1.0)
Monocytes Relative: 8 % (ref 3–12)
Neutro Abs: 7.4 10*3/uL (ref 1.7–7.7)
Neutrophils Relative %: 82 % — ABNORMAL HIGH (ref 43–77)
PLATELETS: 327 10*3/uL (ref 150–400)
RBC: 3.86 MIL/uL — AB (ref 3.87–5.11)
RDW: 14.2 % (ref 11.5–15.5)
WBC: 9.1 10*3/uL (ref 4.0–10.5)

## 2015-03-16 LAB — TYPE AND SCREEN
ABO/RH(D): O NEG
Antibody Screen: NEGATIVE

## 2015-03-16 LAB — PROTIME-INR
INR: 1.11 (ref 0.00–1.49)
PROTHROMBIN TIME: 14.5 s (ref 11.6–15.2)

## 2015-03-16 LAB — APTT: APTT: 34 s (ref 24–37)

## 2015-03-16 SURGERY — RADIOLOGY WITH ANESTHESIA
Anesthesia: General

## 2015-03-16 MED ORDER — DEXTROMETHORPHAN-GUAIFENESIN 10-100 MG/5ML PO LIQD
5.0000 mL | Freq: Two times a day (BID) | ORAL | Status: DC | PRN
  Administered 2015-03-16: 5 mL via ORAL
  Filled 2015-03-16 (×2): qty 5

## 2015-03-16 MED ORDER — PHENYLEPHRINE HCL 10 MG/ML IJ SOLN
10.0000 mg | INTRAVENOUS | Status: DC | PRN
  Administered 2015-03-16: 20 ug/min via INTRAVENOUS

## 2015-03-16 MED ORDER — SODIUM CHLORIDE 0.9 % IV SOLN: INTRAVENOUS | Status: DC

## 2015-03-16 MED ORDER — GELATIN ABSORBABLE 12-7 MM EX MISC
CUTANEOUS | Status: AC
  Filled 2015-03-16: qty 1

## 2015-03-16 MED ORDER — ONDANSETRON HCL 4 MG/2ML IJ SOLN
INTRAMUSCULAR | Status: DC | PRN
  Administered 2015-03-16: 4 mg via INTRAVENOUS

## 2015-03-16 MED ORDER — PROPOFOL 10 MG/ML IV BOLUS
INTRAVENOUS | Status: DC | PRN
  Administered 2015-03-16: 140 mg via INTRAVENOUS

## 2015-03-16 MED ORDER — DEXTROMETHORPHAN-GUAIFENESIN 10-100 MG/5ML PO LIQD
5.0000 mL | Freq: Two times a day (BID) | ORAL | Status: DC | PRN
  Filled 2015-03-16: qty 5

## 2015-03-16 MED ORDER — ROCURONIUM BROMIDE 100 MG/10ML IV SOLN
INTRAVENOUS | Status: DC | PRN
  Administered 2015-03-16: 10 mg via INTRAVENOUS
  Administered 2015-03-16: 40 mg via INTRAVENOUS
  Administered 2015-03-16: 10 mg via INTRAVENOUS

## 2015-03-16 MED ORDER — GELATIN ABSORBABLE 12-7 MM EX MISC
CUTANEOUS | Status: DC
Start: 2015-03-16 — End: 2015-03-17
  Filled 2015-03-16: qty 1

## 2015-03-16 MED ORDER — FUROSEMIDE 20 MG PO TABS
20.0000 mg | ORAL_TABLET | Freq: Every day | ORAL | Status: DC
  Administered 2015-03-17: 20 mg via ORAL
  Filled 2015-03-16: qty 1

## 2015-03-16 MED ORDER — PHENYLEPHRINE HCL 10 MG/ML IJ SOLN
INTRAMUSCULAR | Status: DC | PRN
  Administered 2015-03-16: 80 ug via INTRAVENOUS

## 2015-03-16 MED ORDER — PROPRANOLOL HCL ER 160 MG PO CP24
160.0000 mg | ORAL_CAPSULE | Freq: Every day | ORAL | Status: DC
  Administered 2015-03-16: 160 mg via ORAL
  Filled 2015-03-16 (×2): qty 1

## 2015-03-16 MED ORDER — GLYCOPYRROLATE 0.2 MG/ML IJ SOLN
INTRAMUSCULAR | Status: DC | PRN
  Administered 2015-03-16: 0.3 mg via INTRAVENOUS

## 2015-03-16 MED ORDER — FENTANYL CITRATE (PF) 100 MCG/2ML IJ SOLN
INTRAMUSCULAR | Status: DC | PRN
  Administered 2015-03-16 (×2): 50 ug via INTRAVENOUS
  Administered 2015-03-16: 25 ug via INTRAVENOUS

## 2015-03-16 MED ORDER — CEFAZOLIN SODIUM-DEXTROSE 2-3 GM-% IV SOLR
INTRAVENOUS | Status: AC
Start: 2015-03-16 — End: 2015-03-16
  Administered 2015-03-16: 2 g via INTRAVENOUS
  Filled 2015-03-16: qty 50

## 2015-03-16 MED ORDER — TRAMADOL HCL 50 MG PO TABS
50.0000 mg | ORAL_TABLET | Freq: Four times a day (QID) | ORAL | Status: DC | PRN
  Administered 2015-03-16: 50 mg via ORAL
  Filled 2015-03-16: qty 1

## 2015-03-16 MED ORDER — NEOSTIGMINE METHYLSULFATE 10 MG/10ML IV SOLN
INTRAVENOUS | Status: DC | PRN
  Administered 2015-03-16: 2 mg via INTRAVENOUS

## 2015-03-16 MED ORDER — LACTATED RINGERS IV SOLN
INTRAVENOUS | Status: DC
  Administered 2015-03-16 (×2): via INTRAVENOUS

## 2015-03-16 MED ORDER — PRAZOSIN HCL 2 MG PO CAPS
2.0000 mg | ORAL_CAPSULE | Freq: Every day | ORAL | Status: DC
  Administered 2015-03-16: 2 mg via ORAL
  Filled 2015-03-16 (×2): qty 1

## 2015-03-16 MED ORDER — ONDANSETRON 4 MG PO TBDP: 4.0000 mg | ORAL_TABLET | Freq: Three times a day (TID) | ORAL | Status: DC | PRN

## 2015-03-16 MED ORDER — GUAIFENESIN 200 MG PO TABS
400.0000 mg | ORAL_TABLET | ORAL | Status: DC | PRN
  Filled 2015-03-16: qty 2

## 2015-03-16 MED ORDER — SPIRONOLACTONE 25 MG PO TABS
25.0000 mg | ORAL_TABLET | Freq: Every day | ORAL | Status: DC
  Administered 2015-03-17: 12.5 mg via ORAL
  Filled 2015-03-16: qty 1

## 2015-03-16 MED ORDER — ACETAMINOPHEN 500 MG PO TABS
1000.0000 mg | ORAL_TABLET | ORAL | Status: DC | PRN
  Administered 2015-03-17: 1000 mg via ORAL
  Filled 2015-03-16: qty 2

## 2015-03-16 NOTE — H&P (Signed)
Chief Complaint: Patient was seen in consultation today for repair of Type II endoleak at the request of Dr. Servando Snare  Referring Physician(s): Dr. Lanelle Bal  History of Present Illness: [name] [name] is a 81 y.o. female who was seen by Dr. Laurence Ferrari on July 28th to discuss repair of Type II Endoleak of her Thoracic aneurysm repair.  She has multiple medical problems including aneurysmal dilatation of her descending thoracic aorta. She underwent thoracic endovascular aortic repair on 10/02/2012 using 2 overlapping Gore cTAG endoprostheses. Her postoperative course was complicated by acute exacerbation of her chronic back pain requiring repeat arteriography. No endoleak was identified.  Postoperative surveillance imaging shows the presence of a type II endoleak. Her aneurysm sac has demonstrated significant interval growth over the past 6 months of at least 5 mm. Additionally, the amount of contrast material in the excluded aneurysm sac appears to be increasing concerning for progression of the type II endoleak.  Currently, she is relatively asymptomatic. She continues to have fatigue and chronic back pain but this has improved significantly over time since her last procedure.  She is here today for endovascular repair of her Type II endoleak of her thoracic aneurysm repair.  Past Medical History  Diagnosis Date  . AF (atrial fibrillation)   . Hypertension   . Aortic insufficiency   . Mitral regurgitation   . Tricuspid regurgitation   . CHF (congestive heart failure)   . Aortic aneurysm   . CAD (coronary artery disease)   . Arthritis   . Claustrophobia   . Shortness of breath   . Broken neck     40 years ago  . Broken clavicle August 2016  . Dysrhythmia   . Heart murmur   . COPD (chronic obstructive pulmonary disease)   . History of pneumonia   . History of bronchitis   . Urinary incontinence   . Urinary frequency   . GERD (gastroesophageal reflux disease)   .  Wears glasses   . Anemia     Past Surgical History  Procedure Laterality Date  . Tonsillectomy    . Cystectomy      sacrum  . Thoracic aortic endovascular stent graft N/A 10/02/2012    Procedure: THORACIC AORTIC ENDOVASCULAR STENT GRAFT;  Surgeon: Serafina Mitchell, MD;  Location: [facility];  Service: Vascular;  Laterality: N/A;  Ultrasound guided  . Thoracic aortic endovascular stent graft N/A 10/05/2012    Procedure: Endovascular repair of THORACIC AORTIC ANEURYSM;  Surgeon: Serafina Mitchell, MD;  Location: [facility];  Service: Vascular;  Laterality: N/A;  Angioplasty of Aortic throasic aneurysm, x1 aortagram,  Percutaneous acess right femoral artery.  . Cardiac catheterization      Allergies: Atorvastatin; Amiodarone hcl; Diltiazem hcl; Flecainide acetate; Metoprolol succinate; and Sotalol hcl  Medications: Prior to Admission medications   Medication Sig Start Date End Date Taking? Authorizing Provider  acetaminophen (TYLENOL) 500 MG tablet Take 1,000 mg by mouth every 4 (four) hours as needed for pain.     Historical Provider, MD  Dextromethorphan-Guaifenesin (DELSYM COUGH/CHEST CONGEST DM) 5-100 MG/5ML LIQD Take 5 mLs by mouth every 12 (twelve) hours as needed (congestion).    Historical Provider, MD  enoxaparin (LOVENOX) 60 MG/0.6ML injection Inject 0.6 mLs (60 mg total) into the skin daily. 03/09/15   Thayer Headings, MD  furosemide (LASIX) 20 MG tablet Take 1 tablet (20 mg total) by mouth daily. 08/25/14 10/10/15  Thayer Headings, MD  GuaiFENesin (MUCUS RELIEF PO) Take 1  tablet by mouth every 12 (twelve) hours as needed (mucus).    Historical Provider, MD  ondansetron (ZOFRAN ODT) 4 MG disintegrating tablet Take 1 tablet (4 mg total) by mouth every 8 (eight) hours as needed for nausea or vomiting. 03/03/15   Rita Ohara, MD  prazosin (MINIPRESS) 2 MG capsule Take 1 capsule (2 mg total) by mouth at bedtime. 06/21/14   Thayer Headings, MD  propranolol ER (INDERAL LA) 160 MG SR capsule Take 1 capsule  (160 mg total) by mouth at bedtime. 08/25/14   Thayer Headings, MD  spironolactone (ALDACTONE) 25 MG tablet TAKE ONE HALF TABLET BY MOUTH EVERY MORNING 08/25/14   Thayer Headings, MD  traMADol (ULTRAM) 50 MG tablet Take 50 mg by mouth every 6 (six) hours as needed for moderate pain.    Historical Provider, MD  warfarin (COUMADIN) 5 MG tablet Take 2.5-5 mg by mouth daily. Take 0.5 tablet (2.5mg ) on all days except Wednesday take 1 tablet (5mg )    Historical Provider, MD     Family History  Problem Relation Age of Onset  . Aneurysm Mother 35  . Cancer Mother     breast  . Heart disease Mother   . Hypertension Mother   . Hodgkin's lymphoma Father 41  . Cancer Father     hodgkins    Social History   Social History  . Marital Status: Widowed    Spouse Name: N/A  . Number of Children: 3  . Years of Education: N/A   Occupational History  . Architect    Social History Main Topics  . Smoking status: Former Smoker -- 1.00 packs/day for 20 years    Types: Cigarettes    Quit date: 08/22/1985  . Smokeless tobacco: Never Used  . Alcohol Use: 5.4 oz/week    9 Glasses of wine per week     Comment: a glass of wine every night  . Drug Use: No  . Sexual Activity: No   Other Topics Concern  . None   Social History Narrative     Review of Systems  Constitutional: Positive for activity change and fatigue. Negative for fever, chills and appetite change.  HENT: Negative.   Respiratory: Negative for cough, chest tightness and shortness of breath.   Cardiovascular: Negative for chest pain.  Gastrointestinal: Negative for nausea, vomiting and abdominal pain.  Genitourinary: Negative.   Musculoskeletal: Positive for back pain.  Skin: Negative.   Neurological: Negative.   Psychiatric/Behavioral: Negative.     Vital Signs: Temp 97.7 BP 117/74 Pulse 90 Resp 18  Physical Exam  Constitutional: She is oriented to person, place, and time. She appears well-developed and  well-nourished.  HENT:  Head: Normocephalic and atraumatic.  Eyes: EOM are normal.  Neck: Normal range of motion. Neck supple.  Cardiovascular: Normal rate, regular rhythm and normal heart sounds.   Pulmonary/Chest: Effort normal and breath sounds normal.  Abdominal: Soft. Bowel sounds are normal.  Area of bruising where Lovenox injection was given, quite tender at that area, otherwise benign abdominal exam  Musculoskeletal: Normal range of motion.  Neurological: She is alert and oriented to person, place, and time.  Skin: Skin is warm and dry.  Psychiatric: She has a normal mood and affect. Her behavior is normal. Judgment and thought content normal.    Mallampati Score:  MD Evaluation Airway: WNL Heart: WNL Abdomen: WNL Chest/ Lungs: WNL ASA  Classification: 3 Mallampati/Airway Score: Two  Imaging: Dg Chest Port 1 View  03/16/2015  CLINICAL DATA:  Preoperative evaluation for endovascular stent leak repair. Cough. Shortness of breath.  EXAM: PORTABLE CHEST - 1 VIEW  COMPARISON:  Chest CT January 20, 2015  FINDINGS: There is a descending thoracic aortic stent. There is aneurysmal dilatation to the left of the stent in the mid thoracic descending aortic region. Aneurysm measures just over 8 cm in this area. The heart is mildly enlarged. The pulmonary vascularity is normal. No edema or consolidation. No adenopathy.  IMPRESSION: Descending thoracic aortic aneurysm with stent in place. Mild cardiac enlargement. No edema or consolidation. No effusions apparent.   Electronically Signed   By: Lowella Grip III M.D.   On: 03/16/2015 11:06    Labs:  CBC:  Recent Labs  09/07/14 1132 03/11/15 1303  WBC 5.7 13.1*  HGB 14.5 14.1  HCT 42.4 42.4  PLT 217 342    COAGS:  Recent Labs  01/25/15 1151 02/15/15 1156 03/09/15 1221 03/11/15 1303  INR 3.4 3.1 4.2 2.29*    BMP:  Recent Labs  05/04/14 1139 06/14/14 1116 09/07/14 1132 01/18/15 1009 01/18/15 1011 03/11/15 1303    NA  --   --  138  --   --  134*  K  --   --  4.4  --   --  3.6  CL  --   --  103  --   --  97*  CO2  --   --  25  --   --  26  GLUCOSE  --   --  113*  --   --  122*  BUN  --  27* 34* 36*  --  26*  CALCIUM  --   --  9.8  --   --  9.3  CREATININE 1.40*  --  1.18*  --  1.20* 1.14*  GFRNONAA  --   --   --   --   --  44*  GFRAA  --   --   --   --   --  51*    LIVER FUNCTION TESTS:  Recent Labs  09/07/14 1132  BILITOT 1.3*  AST 20  ALT 17  ALKPHOS 221*  PROT 7.2  ALBUMIN 4.4    TUMOR MARKERS: No results for input(s): AFPTM, CEA, CA199, CHROMGRNA in the last 8760 hours.  Assessment and Plan:  History Endovascular repair of Thoracic Aneurysm  Enlarging thoracic aortic aneurysm despite thoracic endovascular aortic repair secondary to what is likely a progressive type II endoleak.  Her aneurysm has enlarged significantly over the last 6 months and there is a very real risk of rupture. If her aneurysm or to rupture, she likely would not survive any open repair.  Will proceed with endovascular repair of type II Endoleak today by Dr. Laurence Ferrari.  Risks and Benefits discussed with the patient including, but not limited to bleeding, infection, vascular injury or contrast induced renal failure, and even death.  All of the patient's questions were answered, patient is agreeable to proceed. Consent signed and in chart.  Thank you for this interesting consult.  I greatly enjoyed meeting [name] [name] and look forward to participating in their care.  A copy of this report was sent to the requesting provider on this date.  Signed: Murrell Redden PA-C 03/16/2015, 11:24 AM   I spent a total of 25 Minutes in face to face in clinical consultation, greater than 50% of which was counseling/coordinating care for endovascular repair of Type II endoleak.

## 2015-03-16 NOTE — Anesthesia Procedure Notes (Signed)
Procedure Name: Intubation Date/Time: 03/16/2015 1:28 PM Performed by: Sampson Si E Pre-anesthesia Checklist: Patient identified, Emergency Drugs available, Suction available, Patient being monitored and Timeout performed Patient Re-evaluated:Patient Re-evaluated prior to inductionOxygen Delivery Method: Circle system utilized Preoxygenation: Pre-oxygenation with 100% oxygen Intubation Type: IV induction Ventilation: Mask ventilation without difficulty Laryngoscope Size: Mac and 3 Grade View: Grade I Tube type: Oral Tube size: 7.0 mm Number of attempts: 1 Airway Equipment and Method: Stylet Placement Confirmation: ETT inserted through vocal cords under direct vision Secured at: 21 cm Tube secured with: Tape Dental Injury: Teeth and Oropharynx as per pre-operative assessment

## 2015-03-16 NOTE — Transfer of Care (Signed)
Immediate Anesthesia Transfer of Care Note  Patient: [name] [name]  Procedure(s) Performed: Procedure(s): Endoleak Rapair (N/A)  Patient Location: PACU  Anesthesia Type:General  Level of Consciousness: lethargic and responds to stimulation  Airway & Oxygen Therapy: Patient Spontanous Breathing and Patient connected to face mask oxygen  Post-op Assessment: Report given to RN  Post vital signs: Reviewed and stable  Last Vitals:  Filed Vitals:   03/16/15 1035  BP: 117/74  Pulse: 90  Temp: 36.5 C  Resp: 18    Complications: No apparent anesthesia complications

## 2015-03-16 NOTE — Progress Notes (Signed)
Received patient from PACU post surgery . Alert and oriented . VSS, not in any distress. Dressing to back CDI, no bleeding noted.

## 2015-03-16 NOTE — Progress Notes (Signed)
Patient presents with bruising on abdomen and states that it is from Lovenox injections.

## 2015-03-16 NOTE — Procedures (Signed)
Interventional Radiology Procedure Note  Procedure:  Endoleak repair via direct sac puncture with coil and liquid embolic embolization  Complications: none  Estimated Blood Loss: 0  Recommendations:  - Admit for observation - Anticipate DC in am - F/U CTA chest in 4-6 wk with clinic visit   Signed,  Criselda Peaches, MD

## 2015-03-16 NOTE — OR Nursing (Signed)
R rad art lined d/c'd before tx to 6N. Site unremarkable after intact catheter removed.

## 2015-03-16 NOTE — Anesthesia Preprocedure Evaluation (Addendum)
Anesthesia Evaluation  Patient identified by MRN, date of birth, ID band Patient awake    Reviewed: Allergy & Precautions, H&P , NPO status , Patient's Chart, lab work & pertinent test results, reviewed documented beta blocker date and time   History of Anesthesia Complications Negative for: history of anesthetic complications  Airway Mallampati: II   Neck ROM: full    Dental  (+) Dental Advisory Given, Missing   Pulmonary shortness of breath, COPDformer smoker,      - rales    Cardiovascular hypertension, Pt. on medications and Pt. on home beta blockers - angina+ CAD, + Peripheral Vascular Disease and +CHF - Past MI + dysrhythmias Atrial Fibrillation + Valvular Problems/Murmurs AI Rhythm:Irregular Rate:Normal  Thoracic aortic aneurysm s/p stent and subsequent endovascular leak repair TTE 4/15: EF 55%   Neuro/Psych PSYCHIATRIC DISORDERS Anxiety    GI/Hepatic GERD-  ,  Endo/Other    Renal/GU      Musculoskeletal  (+) Arthritis -,   Abdominal   Peds  Hematology  (+) Blood dyscrasia, anemia ,   Anesthesia Other Findings   Reproductive/Obstetrics                        Anesthesia Physical  Anesthesia Plan  ASA: IV  Anesthesia Plan: General   Post-op Pain Management:    Induction: Intravenous  Airway Management Planned: Oral ETT  Additional Equipment: Arterial line  Intra-op Plan:   Post-operative Plan: Extubation in OR  Informed Consent: I have reviewed the patients History and Physical, chart, labs and discussed the procedure including the risks, benefits and alternatives for the proposed anesthesia with the patient or authorized representative who has indicated his/her understanding and acceptance.   Dental advisory given  Plan Discussed with: CRNA and Surgeon  Anesthesia Plan Comments: (Risks/benefits of general anesthesia discussed with patient including risk of damage to  teeth, lips, gum, and tongue, nausea/vomiting, allergic reactions to medications, and the possibility of heart attack, stroke and death.  All patient questions answered.  Patient wishes to proceed.)      Anesthesia Quick Evaluation

## 2015-03-17 ENCOUNTER — Encounter (HOSPITAL_COMMUNITY): Payer: Self-pay | Admitting: Interventional Radiology

## 2015-03-17 ENCOUNTER — Other Ambulatory Visit: Payer: Self-pay | Admitting: Physician Assistant

## 2015-03-17 ENCOUNTER — Observation Stay (HOSPITAL_COMMUNITY): Payer: Medicare Other

## 2015-03-17 ENCOUNTER — Other Ambulatory Visit: Payer: Self-pay | Admitting: Interventional Radiology

## 2015-03-17 DIAGNOSIS — IMO0002 Reserved for concepts with insufficient information to code with codable children: Secondary | ICD-10-CM

## 2015-03-17 DIAGNOSIS — T82330A Leakage of aortic (bifurcation) graft (replacement), initial encounter: Secondary | ICD-10-CM | POA: Diagnosis not present

## 2015-03-17 DIAGNOSIS — IMO0001 Reserved for inherently not codable concepts without codable children: Secondary | ICD-10-CM

## 2015-03-17 DIAGNOSIS — Z9889 Other specified postprocedural states: Secondary | ICD-10-CM | POA: Diagnosis not present

## 2015-03-17 DIAGNOSIS — T82330D Leakage of aortic (bifurcation) graft (replacement), subsequent encounter: Secondary | ICD-10-CM

## 2015-03-17 NOTE — Discharge Summary (Signed)
Patient ID: [name] [name] MRN: [id number] DOB/AGE: February 16, 1932 81 y.o.  Admit date: 03/16/2015 Discharge date: 03/17/2015  Admission Diagnoses: Type II Endoleak of Thoracic Aneurysm Endograft Repair  Discharge Diagnoses:  Active Problems:   Endoleak of aortic graft  Discharged Condition: good  Hospital Course: [facility] [name] [name] is a 81 y.o. female who was seen by Dr. Laurence Ferrari on July 28th to discuss repair of Type II Endoleak of her Thoracic aneurysm repair.  She has multiple medical problems including aneurysmal dilatation of her descending thoracic aorta. She underwent thoracic endovascular aortic repair on 10/02/2012 using 2 overlapping Gore cTAG endoprostheses. Her postoperative course was complicated by acute exacerbation of her chronic back pain requiring repeat arteriography. No endoleak was identified at that time.  Postoperative surveillance imaging showed the presence of a type II endoleak. Her aneurysm sac has demonstrated significant interval growth over the past 6 months of at least 5 mm. Additionally, the amount of contrast material in the excluded aneurysm sac appeared to be increasing concerning for progression of the type II endoleak.  She underwent endovascular repair of what was initially thought to be a Type II endoleak of her thoracic aneurysm repair, but was found to be a Type 1 b endoleak.  This was done yesterday by Dr. Laurence Ferrari  Post operative chest Xray and chest xray today shows no pneumothorax and stable endograft repair.  She is doing well.  She is tolerating a diet. She has ambulated and voided.  She is ready for Discharge home  She understands she is to resume her Lovenox and Warfarin which she takes for Afib and follow up with her usual anticoagulation management provider.  She is to follow up with Dr. Laurence Ferrari in about 4-6 weeks.    She is to avoid any strenuous activity for now.  Consults: None  Treatments: CLINICAL DATA: 81 year old  female with a large thoracic aortic aneurysm status post endovascular repair with inferior extension. Unfortunately, she has an endoleak with persistent enlargement of the aneurysm sac. This is thought to be a type 2 endoleak in she presents for direct sac puncture an endovascular repair.  EXAM: IR EMBO ARTERIAL NOT HEMORR HEMANG INC GUIDE ROADMAPPING; AORTOGRAPHY ; IR CT SPINE LIMITED; ARTERIOGRAPHY  Date: 03/16/2015  PROCEDURE: 1. Cone beam CT imaging of the thoracic spine an aortic aneurysm sac 2. Puncture of the aneurysm sac using a combination of fluoroscopy and cone beam CT 3. Aneurysm sac and aortic arteriography 4. Embolization of the type 1 be endoleak using a combination of metallic coils and onyx liquid embolic. Interventional Radiologist: Criselda Peaches, MD  ANESTHESIA/SEDATION: General endotracheal anesthesia provided by anesthesiology  MEDICATIONS: 2 g Ancef administered intravenously within 1 hour of skin stick  FLUOROSCOPY TIME: 14 minutes 18 seconds  3603 mGy  CONTRAST: 80 mL Omnipaque 350  TECHNIQUE: Informed consent was obtained from the patient following explanation of the procedure, risks, benefits and alternatives. The patient understands, agrees and consents for the procedure. All questions were addressed. A time out was performed.  Maximal barrier sterile technique utilized including caps, mask, sterile gowns, sterile gloves, large sterile drape, hand hygiene, and Betadine skin prep.  Cone beam CT was performed focused on the T8-T10 levels in the region of the known endoleak. Using measurements and angle planning, a course from the skin just left lateral of midline into the aneurysm sac was identified. Under real-time fluoroscopic imaging, a 21 gauge Accustick needle was carefully advanced into the anterior portion of the aneurysm sac  in the region of the known endoleak. Cone beam CT imaging was used intermittently to confirm  needle position.  Upon removal of the stylet pulsatile blood flow was not counter confirming the tip of the needle in the vascularized portion of the excluded aneurysm sac. A gentle hand injection of contrast material further confirmed the endoleak channel. A night tracks wire was advanced and coiled within the endoleak cavity. The needle was then exchanged for the Accustick sheath. Arteriography of the aneurysm sac and abdominal aorta was then performed.  Surprisingly, there is direct communication with the aneurysm sac an the abdominal aorta via channels that extend around the inferior portion of the stent. This is consistent with a type 1 be endoleak. No definite type 2 endoleak was identified. A penumbra Lantern micro catheter was then carefully advanced over a Fathom 16 wire and positioned within the dominant portion of the inflow channel. Coil embolization was then performed using first a 10 x 35 soft penumbra coil followed by a 14 x 60 soft penumbra coil. Finally, a 35 mm penumbra POD packing coil was placed. Contrast injection confirms significantly reduced communication between the type 1 be endoleak an the dominant channel within the aneurysm sac. Given the newly created flow control the decision was made to proceed with liquid embolic embolization in an effort to completely seal the entry of the type 1 be endoleak into the aneurysm cavity.  Therefore, the when turned micro catheter was removed and a Progreat catheter was advanced so that the tip was within the coil pack. Using standard technique, 2 vials of onyx liquid embolic agent was carefully administered directly into the coil pack. There was no antegrade flow into the abdominal aorta. However, there was excellent retrograde flow of on ex into the aneurysm sac. The Progreat micro catheter was removed. A final injection of contrast material through the Accustick sheath confirms no communication with the abdominal  aorta and completely static flow within the now excluded aneurysm cavity.  As the Accustick sheath was removed, the tract was embolized with a Gel-Foam slurry.  COMPLICATIONS: None  IMPRESSION: 1. Direct sac puncture and arteriography reveals a type 1 b endoleak emanating from the distal seal zone. 2. Successful combined coil and liquid embolic embolization of the communication channel between the type 1 b endoleak and the aneurysm sac. On the final injection, the contrast material within the now excluded aneurysm sac is completely stagnant.  Signed,  Criselda Peaches, MD  [facility]  [facility]   Discharge Exam: Blood pressure 114/59, pulse 89, temperature 98 F (36.7 C), temperature source Oral, resp. rate 20, height 5\' 3"  (1.6 m), weight 110 lb 14.3 oz (50.3 kg), SpO2 94 %.  Awake and Alert Access site on her back is Ok, no hematoma or bruising. Lungs Clear Heart Regular Abdomen soft, NTND  Disposition: 01-Home or Self Care  Discharge Instructions    Call MD for:  difficulty breathing, headache or visual disturbances    Complete by:  As directed      Call MD for:  extreme fatigue    Complete by:  As directed      Call MD for:  hives    Complete by:  As directed      Call MD for:  persistant dizziness or light-headedness    Complete by:  As directed      Call MD for:  persistant nausea and vomiting    Complete by:  As directed  Call MD for:  redness, tenderness, or signs of infection (pain, swelling, redness, odor or green/yellow discharge around incision site)    Complete by:  As directed      Call MD for:  severe uncontrolled pain    Complete by:  As directed      Call MD for:  temperature >100.4    Complete by:  As directed      Diet - low sodium heart healthy    Complete by:  As directed      Discharge instructions    Complete by:  As directed   F/U with Dr. Laurence Ferrari in 4-6 weeks. Office  will call with appointment date and time. Call [phone] with any questions or concerns.     Increase activity slowly    Complete by:  As directed             Medication List    TAKE these medications        acetaminophen 500 MG tablet  Commonly known as:  TYLENOL  Take 1,000 mg by mouth every 4 (four) hours as needed for pain.     DELSYM COUGH/CHEST CONGEST DM 5-100 MG/5ML Liqd  Generic drug:  Dextromethorphan-Guaifenesin  Take 5 mLs by mouth every 12 (twelve) hours as needed (congestion).     enoxaparin 60 MG/0.6ML injection  Commonly known as:  LOVENOX  Inject 0.6 mLs (60 mg total) into the skin daily.     furosemide 20 MG tablet  Commonly known as:  LASIX  Take 1 tablet (20 mg total) by mouth daily.     MUCUS RELIEF PO  Take 1 tablet by mouth every 12 (twelve) hours as needed (mucus).     ondansetron 4 MG disintegrating tablet  Commonly known as:  ZOFRAN ODT  Take 1 tablet (4 mg total) by mouth every 8 (eight) hours as needed for nausea or vomiting.     prazosin 2 MG capsule  Commonly known as:  MINIPRESS  Take 1 capsule (2 mg total) by mouth at bedtime.     propranolol ER 160 MG SR capsule  Commonly known as:  INDERAL LA  Take 1 capsule (160 mg total) by mouth at bedtime.     spironolactone 25 MG tablet  Commonly known as:  ALDACTONE  TAKE ONE HALF TABLET BY MOUTH EVERY MORNING     traMADol 50 MG tablet  Commonly known as:  ULTRAM  Take 50 mg by mouth every 6 (six) hours as needed for moderate pain.     warfarin 5 MG tablet  Commonly known as:  COUMADIN  Take 2.5-5 mg by mouth daily. Take 0.5 tablet (2.5mg ) on all days except Wednesday take 1 tablet (5mg )          Signed: Murrell Redden PA-C 03/17/2015, 1:24 PM   I have spent Less Than 30 Minutes discharging Sheryn Bison.

## 2015-03-17 NOTE — Progress Notes (Signed)
Discharge home. Home discharge instruction given, no question verbalized. 

## 2015-03-17 NOTE — Anesthesia Postprocedure Evaluation (Signed)
Anesthesia Post Note  Patient: [name] [name]  Procedure(s) Performed: Procedure(s) (LRB): Endoleak Rapair (N/A)  Anesthesia type: General  Patient location: PACU  Post pain: Pain level controlled  Post assessment: Post-op Vital signs reviewed  Last Vitals: BP 114/59 mmHg  Pulse 89  Temp(Src) 36.7 C (Oral)  Resp 20  Ht 5\' 3"  (1.6 m)  Wt 110 lb 14.3 oz (50.3 kg)  BMI 19.65 kg/m2  SpO2 94%  Post vital signs: Reviewed  Level of consciousness: sedated  Complications: No apparent anesthesia complications

## 2015-03-17 NOTE — Progress Notes (Signed)
Foley removed at 830 pm as ordered, patient due to void by 0430, will continue to monitor.

## 2015-03-22 ENCOUNTER — Other Ambulatory Visit (HOSPITAL_COMMUNITY): Payer: Self-pay | Admitting: Interventional Radiology

## 2015-03-22 ENCOUNTER — Ambulatory Visit (INDEPENDENT_AMBULATORY_CARE_PROVIDER_SITE_OTHER): Payer: Medicare Other

## 2015-03-22 DIAGNOSIS — I481 Persistent atrial fibrillation: Secondary | ICD-10-CM

## 2015-03-22 DIAGNOSIS — I4819 Other persistent atrial fibrillation: Secondary | ICD-10-CM

## 2015-03-22 DIAGNOSIS — IMO0001 Reserved for inherently not codable concepts without codable children: Secondary | ICD-10-CM

## 2015-03-22 DIAGNOSIS — I4891 Unspecified atrial fibrillation: Secondary | ICD-10-CM

## 2015-03-22 DIAGNOSIS — Z7901 Long term (current) use of anticoagulants: Secondary | ICD-10-CM | POA: Diagnosis not present

## 2015-03-22 DIAGNOSIS — Z5181 Encounter for therapeutic drug level monitoring: Secondary | ICD-10-CM

## 2015-03-22 DIAGNOSIS — T82330D Leakage of aortic (bifurcation) graft (replacement), subsequent encounter: Secondary | ICD-10-CM

## 2015-03-22 LAB — POCT INR: INR: 1.3

## 2015-03-25 ENCOUNTER — Ambulatory Visit (INDEPENDENT_AMBULATORY_CARE_PROVIDER_SITE_OTHER): Payer: Medicare Other | Admitting: *Deleted

## 2015-03-25 DIAGNOSIS — I481 Persistent atrial fibrillation: Secondary | ICD-10-CM | POA: Diagnosis not present

## 2015-03-25 DIAGNOSIS — Z7901 Long term (current) use of anticoagulants: Secondary | ICD-10-CM

## 2015-03-25 DIAGNOSIS — I4891 Unspecified atrial fibrillation: Secondary | ICD-10-CM | POA: Diagnosis not present

## 2015-03-25 DIAGNOSIS — Z5181 Encounter for therapeutic drug level monitoring: Secondary | ICD-10-CM

## 2015-03-25 DIAGNOSIS — I4819 Other persistent atrial fibrillation: Secondary | ICD-10-CM

## 2015-03-25 LAB — POCT INR: INR: 2

## 2015-04-06 ENCOUNTER — Ambulatory Visit (INDEPENDENT_AMBULATORY_CARE_PROVIDER_SITE_OTHER): Payer: Medicare Other | Admitting: *Deleted

## 2015-04-06 DIAGNOSIS — Z5181 Encounter for therapeutic drug level monitoring: Secondary | ICD-10-CM

## 2015-04-06 DIAGNOSIS — I4819 Other persistent atrial fibrillation: Secondary | ICD-10-CM

## 2015-04-06 DIAGNOSIS — Z7901 Long term (current) use of anticoagulants: Secondary | ICD-10-CM | POA: Diagnosis not present

## 2015-04-06 DIAGNOSIS — I4891 Unspecified atrial fibrillation: Secondary | ICD-10-CM

## 2015-04-06 DIAGNOSIS — I481 Persistent atrial fibrillation: Secondary | ICD-10-CM

## 2015-04-06 LAB — POCT INR: INR: 2.9

## 2015-04-19 ENCOUNTER — Ambulatory Visit (HOSPITAL_COMMUNITY)
Admission: RE | Admit: 2015-04-19 | Discharge: 2015-04-19 | Disposition: A | Discharge status: Home / Self Care | Payer: Medicare Other | Source: Ambulatory Visit | Attending: Interventional Radiology | Admitting: Interventional Radiology

## 2015-04-19 ENCOUNTER — Ambulatory Visit
Admission: RE | Admit: 2015-04-19 | Discharge: 2015-04-19 | Disposition: A | Discharge status: Home / Self Care | Payer: Medicare Other | Source: Ambulatory Visit | Attending: Physician Assistant | Admitting: Physician Assistant

## 2015-04-19 DIAGNOSIS — I517 Cardiomegaly: Secondary | ICD-10-CM | POA: Diagnosis not present

## 2015-04-19 DIAGNOSIS — I2510 Atherosclerotic heart disease of native coronary artery without angina pectoris: Secondary | ICD-10-CM | POA: Diagnosis not present

## 2015-04-19 DIAGNOSIS — Y832 Surgical operation with anastomosis, bypass or graft as the cause of abnormal reaction of the patient, or of later complication, without mention of misadventure at the time of the procedure: Secondary | ICD-10-CM | POA: Insufficient documentation

## 2015-04-19 DIAGNOSIS — IMO0001 Reserved for inherently not codable concepts without codable children: Secondary | ICD-10-CM

## 2015-04-19 DIAGNOSIS — R918 Other nonspecific abnormal finding of lung field: Secondary | ICD-10-CM | POA: Diagnosis not present

## 2015-04-19 DIAGNOSIS — T82330D Leakage of aortic (bifurcation) graft (replacement), subsequent encounter: Secondary | ICD-10-CM | POA: Insufficient documentation

## 2015-04-19 DIAGNOSIS — I712 Thoracic aortic aneurysm, without rupture: Secondary | ICD-10-CM | POA: Insufficient documentation

## 2015-04-19 MED ORDER — IOHEXOL 350 MG/ML SOLN
75.0000 mL | Freq: Once | INTRAVENOUS | Status: AC | PRN
  Administered 2015-04-19: 75 mL via INTRAVENOUS

## 2015-04-19 MED ORDER — IOHEXOL 350 MG/ML SOLN
100.0000 mL | Freq: Once | INTRAVENOUS | Status: AC | PRN
  Administered 2015-04-19: 100 mL via INTRAVENOUS

## 2015-04-20 NOTE — Progress Notes (Signed)
Chief Complaint: Patient was seen in consultation today for  Chief Complaint  Patient presents with  . Follow-up    1 mo follow up Type 2 Endoleak Repair     at the request of Servando Snare, Ed  Referring Physician(s): Servando Snare, Ed  History of Present Illness: [name] [name] is a 81 y.o. female with a large descending thoracic aneurysm previously repaired by TEVAR.  She had an endoleak and definite growth of the sac over time.  She underwent a direct sac puncture repair on 8/ 31/16.  AT that time, angiography revealed what we expected to be a Type 2 endoleak was actually a Type 1b.  Thanks to fortuitous bottle-necking of the endoleak entry site into the sac, we were able to place a coil and Onyx liquid embolic plug into this bottle neck which we felt repaired the endoleak.   Today she is doing very well.  She continues to have chronic back pain but this is her baseline.  She tolerated the anesthesia for her procedure just fine, no complications or excessive fatigue.  We went over her imaging and she is very pleased.    Past Medical History  Diagnosis Date  . AF (atrial fibrillation)   . Hypertension   . Aortic insufficiency   . Mitral regurgitation   . Tricuspid regurgitation   . CHF (congestive heart failure)   . Aortic aneurysm   . CAD (coronary artery disease)   . Arthritis   . Claustrophobia   . Shortness of breath   . Broken neck     40 years ago  . Broken clavicle August 2016  . Dysrhythmia   . Heart murmur   . COPD (chronic obstructive pulmonary disease)   . History of pneumonia   . History of bronchitis   . Urinary incontinence   . Urinary frequency   . GERD (gastroesophageal reflux disease)   . Wears glasses   . Anemia     Past Surgical History  Procedure Laterality Date  . Tonsillectomy    . Cystectomy      sacrum  . Thoracic aortic endovascular stent graft N/A 10/02/2012    Procedure: THORACIC AORTIC ENDOVASCULAR STENT GRAFT;  Surgeon: Serafina Mitchell,  MD;  Location: [facility];  Service: Vascular;  Laterality: N/A;  Ultrasound guided  . Thoracic aortic endovascular stent graft N/A 10/05/2012    Procedure: Endovascular repair of THORACIC AORTIC ANEURYSM;  Surgeon: Serafina Mitchell, MD;  Location: [facility] OR;  Service: Vascular;  Laterality: N/A;  Angioplasty of Aortic throasic aneurysm, x1 aortagram,  Percutaneous acess right femoral artery.  . Cardiac catheterization    . Radiology with anesthesia N/A 03/16/2015    Procedure: Endoleak Rapair;  Surgeon: Jacqulynn Cadet, MD;  Location: [facility];  Service: Radiology;  Laterality: N/A;    Allergies: Atorvastatin; Amiodarone hcl; Diltiazem hcl; Flecainide acetate; Metoprolol succinate; and Sotalol hcl  Medications: Prior to Admission medications   Medication Sig Start Date End Date Taking? Authorizing Provider  acetaminophen (TYLENOL) 500 MG tablet Take 1,000 mg by mouth every 4 (four) hours as needed for pain.    Yes Historical Provider, MD  furosemide (LASIX) 20 MG tablet Take 1 tablet (20 mg total) by mouth daily. 08/25/14 10/10/15 Yes Thayer Headings, MD  prazosin (MINIPRESS) 2 MG capsule Take 1 capsule (2 mg total) by mouth at bedtime. 06/21/14  Yes Thayer Headings, MD  propranolol ER (INDERAL LA) 160 MG SR capsule Take 1 capsule (160 mg total) by  mouth at bedtime. 08/25/14  Yes Thayer Headings, MD  spironolactone (ALDACTONE) 25 MG tablet TAKE ONE HALF TABLET BY MOUTH EVERY MORNING 08/25/14  Yes Thayer Headings, MD  warfarin (COUMADIN) 5 MG tablet Take 2.5-5 mg by mouth daily. Take 1/2 tablet (dose=2.5mg ) po daily.  Per patient: as directed by [facility]/Dr Nahser)   Yes Historical Provider, MD  traMADol (ULTRAM) 50 MG tablet Take 50 mg by mouth every 6 (six) hours as needed for moderate pain.    Historical Provider, MD     Family History  Problem Relation Age of Onset  . Aneurysm Mother 7  . Cancer Mother     breast  . Heart disease Mother   . Hypertension Mother   . Hodgkin's lymphoma Father 74    . Cancer Father     hodgkins    Social History   Social History  . Marital Status: Widowed    Spouse Name: N/A  . Number of Children: 3  . Years of Education: N/A   Occupational History  . Architect    Social History Main Topics  . Smoking status: Former Smoker -- 1.00 packs/day for 20 years    Types: Cigarettes    Quit date: 08/22/1985  . Smokeless tobacco: Never Used  . Alcohol Use: 5.4 oz/week    9 Glasses of wine per week     Comment: a glass of wine every night  . Drug Use: No  . Sexual Activity: No   Other Topics Concern  . Not on file   Social History Narrative    Review of Systems: A 12 point ROS discussed and pertinent positives are indicated in the HPI above.  All other systems are negative.  Review of Systems  Vital Signs: BP 105/59 mmHg  Pulse 78  Temp(Src) 97.9 F (36.6 C) (Oral)  Resp 14  SpO2 97%  Physical Exam  Constitutional: She is oriented to person, place, and time. She appears well-developed and well-nourished. No distress.  HENT:  Head: Normocephalic and atraumatic.  Eyes: No scleral icterus.  Cardiovascular: Normal rate.   Pulmonary/Chest: Effort normal.  Abdominal: Soft. She exhibits no distension. There is no tenderness.  Neurological: She is alert and oriented to person, place, and time.  Skin: Skin is warm and dry.  Psychiatric: She has a normal mood and affect. Her behavior is normal.  Nursing note and vitals reviewed.   Mallampati Score:  2 Imaging: No results found.  Labs:  CBC:  Recent Labs  09/07/14 1132 03/11/15 1303 03/16/15 1117  WBC 5.7 13.1* 9.1  HGB 14.5 14.1 12.5  HCT 42.4 42.4 37.9  PLT 217 342 327    COAGS:  Recent Labs  03/16/15 1117 03/22/15 1608 03/25/15 1631 04/06/15 1223  INR 1.11 1.3 2.0 2.9  APTT 34  --   --   --     BMP:  Recent Labs  05/04/14 1139 06/14/14 1116 09/07/14 1132 01/18/15 1009 01/18/15 1011 03/11/15 1303  NA  --   --  138  --   --  134*  K  --   --   4.4  --   --  3.6  CL  --   --  103  --   --  97*  CO2  --   --  25  --   --  26  GLUCOSE  --   --  113*  --   --  122*  BUN  --  27* 34* 36*  --  26*  CALCIUM  --   --  9.8  --   --  9.3  CREATININE 1.40*  --  1.18*  --  1.20* 1.14*  GFRNONAA  --   --   --   --   --  44*  GFRAA  --   --   --   --   --  51*    LIVER FUNCTION TESTS:  Recent Labs  09/07/14 1132  BILITOT 1.3*  AST 20  ALT 17  ALKPHOS 221*  PROT 7.2  ALBUMIN 4.4    TUMOR MARKERS: No results for input(s): AFPTM, CEA, CA199, CHROMGRNA in the last 8760 hours.  Assessment and Plan:  Doing very well 5 weeks post endovascular repair of type 1b thoracic aortic endoleak.  She feels well and has no complaints.   Her CTA looks great, I don't see any residual endoleak and the coil/onyx pack is well positioned.  1.) Return visit and repeat CTA in 6 months   Signed: [place], [place] 04/20/2015, 11:21 AM   I spent a total of  15 Minutes in face to face in clinical consultation, greater than 50% of which was counseling/coordinating care for Type 1b endoleak.

## 2015-04-27 ENCOUNTER — Ambulatory Visit (INDEPENDENT_AMBULATORY_CARE_PROVIDER_SITE_OTHER): Payer: Medicare Other | Admitting: *Deleted

## 2015-04-27 DIAGNOSIS — I481 Persistent atrial fibrillation: Secondary | ICD-10-CM

## 2015-04-27 DIAGNOSIS — Z7901 Long term (current) use of anticoagulants: Secondary | ICD-10-CM | POA: Diagnosis not present

## 2015-04-27 DIAGNOSIS — I4891 Unspecified atrial fibrillation: Secondary | ICD-10-CM

## 2015-04-27 DIAGNOSIS — I4819 Other persistent atrial fibrillation: Secondary | ICD-10-CM

## 2015-04-27 DIAGNOSIS — Z5181 Encounter for therapeutic drug level monitoring: Secondary | ICD-10-CM | POA: Diagnosis not present

## 2015-04-27 LAB — POCT INR: INR: 2.7

## 2015-05-11 DIAGNOSIS — Z23 Encounter for immunization: Secondary | ICD-10-CM | POA: Diagnosis not present

## 2015-05-25 ENCOUNTER — Ambulatory Visit (INDEPENDENT_AMBULATORY_CARE_PROVIDER_SITE_OTHER): Payer: Medicare Other | Admitting: *Deleted

## 2015-05-25 DIAGNOSIS — I4819 Other persistent atrial fibrillation: Secondary | ICD-10-CM

## 2015-05-25 DIAGNOSIS — Z7901 Long term (current) use of anticoagulants: Secondary | ICD-10-CM

## 2015-05-25 DIAGNOSIS — I481 Persistent atrial fibrillation: Secondary | ICD-10-CM

## 2015-05-25 DIAGNOSIS — Z5181 Encounter for therapeutic drug level monitoring: Secondary | ICD-10-CM | POA: Diagnosis not present

## 2015-05-25 DIAGNOSIS — I4891 Unspecified atrial fibrillation: Secondary | ICD-10-CM

## 2015-05-25 LAB — POCT INR: INR: 2.3

## 2015-06-06 ENCOUNTER — Other Ambulatory Visit: Payer: Self-pay | Admitting: Cardiovascular Disease

## 2015-06-06 MED ORDER — PRAZOSIN HCL 2 MG PO CAPS: 2.0000 mg | ORAL_CAPSULE | Freq: Every day | ORAL | Status: DC

## 2015-06-29 ENCOUNTER — Ambulatory Visit (INDEPENDENT_AMBULATORY_CARE_PROVIDER_SITE_OTHER): Payer: Medicare Other | Admitting: *Deleted

## 2015-06-29 DIAGNOSIS — I4891 Unspecified atrial fibrillation: Secondary | ICD-10-CM

## 2015-06-29 DIAGNOSIS — I481 Persistent atrial fibrillation: Secondary | ICD-10-CM | POA: Diagnosis not present

## 2015-06-29 DIAGNOSIS — Z5181 Encounter for therapeutic drug level monitoring: Secondary | ICD-10-CM

## 2015-06-29 DIAGNOSIS — I4819 Other persistent atrial fibrillation: Secondary | ICD-10-CM

## 2015-06-29 DIAGNOSIS — Z7901 Long term (current) use of anticoagulants: Secondary | ICD-10-CM

## 2015-06-29 LAB — POCT INR: INR: 2.3

## 2015-07-19 ENCOUNTER — Encounter: Payer: Self-pay | Admitting: Family Medicine

## 2015-07-19 ENCOUNTER — Ambulatory Visit (INDEPENDENT_AMBULATORY_CARE_PROVIDER_SITE_OTHER): Payer: Medicare Other | Admitting: Family Medicine

## 2015-07-19 VITALS — BP 100/60 | Ht 62.5 in | Wt 113.0 lb

## 2015-07-19 DIAGNOSIS — L858 Other specified epidermal thickening: Secondary | ICD-10-CM | POA: Diagnosis not present

## 2015-07-19 DIAGNOSIS — S81811A Laceration without foreign body, right lower leg, initial encounter: Secondary | ICD-10-CM | POA: Diagnosis not present

## 2015-07-19 DIAGNOSIS — S301XXA Contusion of abdominal wall, initial encounter: Secondary | ICD-10-CM | POA: Diagnosis not present

## 2015-07-19 NOTE — Progress Notes (Signed)
   Subjective:    Patient ID: [name] [name], female    DOB: 03-04-1932, 82 y.o.   MRN: [id number]  HPI She is here for evaluation of multiple lesions. She has a lesion present on her anterior chest that she would like evaluated. She also sustained a superficial laceration to the right lower shin area and she is concerned about infection. She also has had a recent fall and did injure her abdomen today she had some ecchymosis in the peri-umbilical area.   Review of Systems     Objective:   Physical Exam Alert and in no distress. Exam of her anterior chest does show a cutaneous horn that has become quite black. Exam of her right lower leg does show a V-shaped laceration that is slowly healing with no evidence of infection. Abdominal wall shows no ecchymosis but there is a 1-1/2 cm round nontender lesion present inferior to the umbilicus.       Assessment & Plan:  Cutaneous horn  Leg laceration, right, initial encounter  Abdominal wall contusion, initial encounter  the cutaneous horn was easily removed manually. A Band-Aid was applied. Band-Aid was also applied for shin area and recommend she keep the area clean and dry. No therapy needed for the abdominal wall.

## 2015-08-03 ENCOUNTER — Ambulatory Visit (INDEPENDENT_AMBULATORY_CARE_PROVIDER_SITE_OTHER): Payer: Medicare Other | Admitting: *Deleted

## 2015-08-03 DIAGNOSIS — I481 Persistent atrial fibrillation: Secondary | ICD-10-CM | POA: Diagnosis not present

## 2015-08-03 DIAGNOSIS — I4819 Other persistent atrial fibrillation: Secondary | ICD-10-CM

## 2015-08-03 DIAGNOSIS — Z7901 Long term (current) use of anticoagulants: Secondary | ICD-10-CM

## 2015-08-03 DIAGNOSIS — Z5181 Encounter for therapeutic drug level monitoring: Secondary | ICD-10-CM

## 2015-08-03 DIAGNOSIS — I4891 Unspecified atrial fibrillation: Secondary | ICD-10-CM

## 2015-08-03 LAB — POCT INR: INR: 2.2

## 2015-08-15 ENCOUNTER — Ambulatory Visit (INDEPENDENT_AMBULATORY_CARE_PROVIDER_SITE_OTHER): Payer: Medicare Other | Admitting: Family Medicine

## 2015-08-15 ENCOUNTER — Encounter: Payer: Self-pay | Admitting: Family Medicine

## 2015-08-15 VITALS — BP 98/66 | HR 62 | Wt 112.0 lb

## 2015-08-15 DIAGNOSIS — L089 Local infection of the skin and subcutaneous tissue, unspecified: Secondary | ICD-10-CM

## 2015-08-15 DIAGNOSIS — L723 Sebaceous cyst: Secondary | ICD-10-CM

## 2015-08-15 NOTE — Progress Notes (Signed)
   Subjective:    Patient ID: [name] [name], female    DOB: 06/05/1932, 82 y.o.   MRN: [id number]  HPI She complains of swelling and pain in the right preauricular area. She states a prior to this she did have a small lump in that area.   Review of Systems     Objective:   Physical Exam 2 cm round tender area in the right preauricular area is noted. Rest the ear exam was normal.       Assessment & Plan:  Infected sebaceous cyst The area was injected with Xylocaine and epinephrine. An I+D was performed without difficulty. The wound was packed. She was given instructions on proper care of leaving the packing in for 48 hours and then irrigating it several times per day. She will call if further trouble.

## 2015-08-24 ENCOUNTER — Ambulatory Visit (INDEPENDENT_AMBULATORY_CARE_PROVIDER_SITE_OTHER): Payer: Medicare Other | Admitting: Cardiovascular Disease

## 2015-08-24 ENCOUNTER — Encounter: Payer: Self-pay | Admitting: Cardiovascular Disease

## 2015-08-24 VITALS — BP 126/90 | HR 67 | Ht 62.5 in | Wt 111.1 lb

## 2015-08-24 DIAGNOSIS — I10 Essential (primary) hypertension: Secondary | ICD-10-CM

## 2015-08-24 DIAGNOSIS — I340 Nonrheumatic mitral (valve) insufficiency: Secondary | ICD-10-CM | POA: Diagnosis not present

## 2015-08-24 DIAGNOSIS — I272 Other secondary pulmonary hypertension: Secondary | ICD-10-CM | POA: Diagnosis not present

## 2015-08-24 DIAGNOSIS — I482 Chronic atrial fibrillation, unspecified: Secondary | ICD-10-CM

## 2015-08-24 DIAGNOSIS — R0609 Other forms of dyspnea: Secondary | ICD-10-CM | POA: Diagnosis not present

## 2015-08-24 DIAGNOSIS — E785 Hyperlipidemia, unspecified: Secondary | ICD-10-CM

## 2015-08-24 MED ORDER — SPIRONOLACTONE 25 MG PO TABS: ORAL_TABLET | ORAL | Status: DC

## 2015-08-24 MED ORDER — PRAZOSIN HCL 2 MG PO CAPS: 2.0000 mg | ORAL_CAPSULE | Freq: Every day | ORAL | Status: DC

## 2015-08-24 MED ORDER — PROPRANOLOL HCL ER 160 MG PO CP24: 160.0000 mg | ORAL_CAPSULE | Freq: Every day | ORAL | Status: DC

## 2015-08-24 MED ORDER — FUROSEMIDE 20 MG PO TABS
20.0000 mg | ORAL_TABLET | Freq: Every day | ORAL | Status: DC
Start: 2015-08-24 — End: 2016-07-31

## 2015-08-24 MED ORDER — WARFARIN SODIUM 5 MG PO TABS: 2.5000 mg | ORAL_TABLET | Freq: Every day | ORAL | Status: DC

## 2015-08-24 NOTE — Patient Instructions (Signed)
Medication Instructions:  Your physician recommends that you continue on your current medications as directed. Please refer to the Current Medication list given to you today.   Labwork: None Ordered   Testing/Procedures: Your physician has requested that you have an echocardiogram. Echocardiography is a painless test that uses sound waves to create images of your heart. It provides your doctor with information about the size and shape of your heart and how well your heart's chambers and valves are working. This procedure takes approximately one hour. There are no restrictions for this procedure.   Follow-Up Your physician wants you to follow-up in: 6 months with Dr. Nahser.  You will receive a reminder letter in the mail two months in advance. If you don't receive a letter, please call our office to schedule the follow-up appointment.   If you need a refill on your cardiac medications before your next appointment, please call your pharmacy.   Thank you for choosing [facility]! [place] Mccuen, RN [phone]    

## 2015-08-24 NOTE — Progress Notes (Signed)
Cardiology Office Note   Date:  08/24/2015   ID:  [name] [name], DOB September 01, 1931, MRN [id number]  PCP:  [name], MD  Cardiologist:   Thayer Headings, MD   Chief Complaint  Patient presents with  . Follow-up    pt states no chest pain but has been SOB and leg weakness for years   Problem List:  1. Atrial fibrillation-we have tried her on Rythmol, flecainide, Sotalol, and amiodarone. Her insurance company will not pay for [name]. 2. Hypertension 3. Thoracic aortic aneurism Serv[employer] Snare) - s/p stent graft repair. Complicated by a type III endoleak. Repaired with another stenting.  4. Pulmonary Hypertension - moderate , est. PA pressures of 55.   August 25, 2014   [name] [name] is a 82 y.o. female who presents for her atrial fib.  Same symptoms.  Still short of breath.  Has lots of fatigue with any exercise. She continues to have Afib.  Has tried multiple meds.  Has had lots of back pain - since her thoracic aortic surgery .  She gets very fatigued with any sort of walking or exercise  February 23, 2015:  Still having lots of back pain - has lost her balance.  Is short of breath also  Has fallen on several occasions  Needs to have vascular surgery later this month.   Her stent graft has an endoleak .   Feb. 8, 2017  Doing ok Still has DOE - possibly a bit worse than before  Still in atrial fib   Has had an aortic aneurism stent graft repair.  Had an endoleak, with repair.      Past Medical History  Diagnosis Date  . AF (atrial fibrillation) ([place])   . Hypertension   . Aortic insufficiency   . Mitral regurgitation   . Tricuspid regurgitation   . CHF (congestive heart failure) ([place])   . Aortic aneurysm ([place])   . CAD (coronary artery disease)   . Arthritis   . Claustrophobia   . Shortness of breath   . Broken neck ([place])     40 years ago  . Broken clavicle August 2016  . Dysrhythmia   . Heart murmur   . COPD (chronic obstructive pulmonary  disease) ([place])   . History of pneumonia   . History of bronchitis   . Urinary incontinence   . Urinary frequency   . GERD (gastroesophageal reflux disease)   . Wears glasses   . Anemia     Past Surgical History  Procedure Laterality Date  . Tonsillectomy    . Cystectomy      sacrum  . Thoracic aortic endovascular stent graft N/A 10/02/2012    Procedure: THORACIC AORTIC ENDOVASCULAR STENT GRAFT;  Surgeon: Serafina Mitchell, MD;  Location: [facility];  Service: Vascular;  Laterality: N/A;  Ultrasound guided  . Thoracic aortic endovascular stent graft N/A 10/05/2012    Procedure: Endovascular repair of THORACIC AORTIC ANEURYSM;  Surgeon: Serafina Mitchell, MD;  Location: [facility];  Service: Vascular;  Laterality: N/A;  Angioplasty of Aortic throasic aneurysm, x1 aortagram,  Percutaneous acess right femoral artery.  . Cardiac catheterization    . Radiology with anesthesia N/A 03/16/2015    Procedure: Endoleak Rapair;  Surgeon: Jacqulynn Cadet, MD;  Location: [facility];  Service: Radiology;  Laterality: N/A;     Current Outpatient Prescriptions  Medication Sig Dispense Refill  . acetaminophen (TYLENOL) 500 MG tablet Take 1,000 mg by mouth every 4 (four) hours as  needed for pain.     . furosemide (LASIX) 20 MG tablet Take 1 tablet (20 mg total) by mouth daily. 90 tablet 3  . prazosin (MINIPRESS) 2 MG capsule Take 1 capsule (2 mg total) by mouth at bedtime. 90 capsule 1  . propranolol ER (INDERAL LA) 160 MG SR capsule Take 1 capsule (160 mg total) by mouth at bedtime. 90 capsule 3  . spironolactone (ALDACTONE) 25 MG tablet TAKE ONE HALF TABLET BY MOUTH EVERY MORNING 45 tablet 3  . warfarin (COUMADIN) 5 MG tablet Take 2.5-5 mg by mouth daily. Take 1/2 tablet (dose=2.5mg ) po daily.  Per patient: as directed by [facility]/Dr Kaydie Petsch)     No current facility-administered medications for this visit.    Allergies:   Atorvastatin; Amiodarone hcl; Diltiazem hcl; Flecainide acetate; Metoprolol succinate; and  Sotalol hcl    Social History:  The patient  reports that she quit smoking about 30 years ago. Her smoking use included Cigarettes. She has a 20 pack-year smoking history. She has never used smokeless tobacco. She reports that she drinks about 5.4 oz of alcohol per week. She reports that she does not use illicit drugs.   Family History:  The patient's family history includes Aneurysm (age of onset: 33) in her mother; Cancer in her father and mother; Heart disease in her mother; Hodgkin's lymphoma (age of onset: 64) in her father; Hypertension in her mother.    ROS:  Please see the history of present illness.    Review of Systems: Constitutional:  denies fever, chills, diaphoresis, appetite change and fatigue.  HEENT: denies photophobia, eye pain, redness, hearing loss, ear pain, congestion, sore throat, rhinorrhea, sneezing, neck pain, neck stiffness and tinnitus.  Respiratory: denies SOB, DOE, cough, chest tightness, and wheezing.  Cardiovascular: denies chest pain, palpitations and leg swelling.  Gastrointestinal: denies nausea, vomiting, abdominal pain, diarrhea, constipation, blood in stool.  Genitourinary: denies dysuria, urgency, frequency, hematuria, flank pain and difficulty urinating.  Musculoskeletal: denies  myalgias, back pain, joint swelling, arthralgias and gait problem.   Skin: denies pallor, rash and wound.  Neurological: denies dizziness, seizures, syncope, weakness, light-headedness, numbness and headaches.   Hematological: denies adenopathy, easy bruising, personal or family bleeding history.  Psychiatric/ Behavioral: denies suicidal ideation, mood changes, confusion, nervousness, sleep disturbance and agitation.       All other systems are reviewed and negative.    PHYSICAL EXAM: VS:  BP 126/90 mmHg  Pulse 67  Ht 5' 2.5" (1.588 m)  Wt 111 lb 1.9 oz (50.404 kg)  BMI 19.99 kg/m2  SpO2 96% , BMI Body mass index is 19.99 kg/(m^2). GEN: Well nourished, well  developed, in no acute distress HEENT: normal Neck: no JVD, carotid bruits, or masses Cardiac: irreg. Irreg. ; 2/6 diastolic murmur ,  RV heave  ,no edema  Respiratory:  clear to auscultation bilaterally, normal work of breathing GI: soft, nontender, nondistended, + BS MS: no deformity or atrophy Skin: warm and dry, no rash Neuro:  Strength and sensation are intact Psych: normal   EKG:  EKG is ordered today. The ekg ordered today demonstrates atrial fib with rate of 86    Recent Labs: 09/07/2014: ALT 17 03/11/2015: BUN 26*; Creatinine, Ser 1.14*; Potassium 3.6; Sodium 134* 03/16/2015: Hemoglobin 12.5; Platelets 327    Lipid Panel    Component Value Date/Time   CHOL 204* 09/07/2014 1132   TRIG 64 09/07/2014 1132   HDL 55 09/07/2014 1132   CHOLHDL 3.7 09/07/2014 1132   VLDL 13  09/07/2014 1132   LDLCALC 136* 09/07/2014 1132   LDLDIRECT 127.6 08/18/2012 1011      Wt Readings from Last 3 Encounters:  08/24/15 111 lb 1.9 oz (50.404 kg)  08/15/15 112 lb (50.803 kg)  07/19/15 113 lb (51.256 kg)      Other studies Reviewed: Additional studies/ records that were reviewed today include: . Review of the above records demonstrates:    ASSESSMENT AND PLAN:  1. Atrial fibrillation-we have tried her on Rythmol, flecainide, Sotalol, and amiodarone. Her insurance company will not pay for [name].  Her rate is well controlled. Continue current meds.   2. Hypertension - she has been on her BP meds for years.  Will continue   3. Thoracic aortic aneurism [employer]) - s/p stent graft repair. Complicated by a type III endoleak. Repaired with another stenting.  She continues to have some back pain .  Will discuss with Dr. Servando Snare at next apt.    4. Pulmonary Hypertension - moderate , est. PA pressures of 55.  Advised her to limit her salt  She has an RV heave today. Will repeat her echo.     Current medicines are reviewed at length with the patient today.  The patient does not  have concerns regarding medicines.  The following changes have been made:  no change   Disposition:   FU with me in 6 months .     Signed, Wai Minotti, Wonda Cheng, MD  08/24/2015 11:37 AM    [address], [place], [place]  [address] Phone: [phone]; Fax: [phone]

## 2015-09-14 ENCOUNTER — Ambulatory Visit (INDEPENDENT_AMBULATORY_CARE_PROVIDER_SITE_OTHER): Payer: Medicare Other | Admitting: *Deleted

## 2015-09-14 ENCOUNTER — Ambulatory Visit (INDEPENDENT_AMBULATORY_CARE_PROVIDER_SITE_OTHER): Payer: Medicare Other | Admitting: Family Medicine

## 2015-09-14 ENCOUNTER — Encounter: Payer: Self-pay | Admitting: Family Medicine

## 2015-09-14 ENCOUNTER — Other Ambulatory Visit: Payer: Self-pay

## 2015-09-14 ENCOUNTER — Ambulatory Visit (HOSPITAL_COMMUNITY): Payer: Medicare Other | Attending: Cardiology

## 2015-09-14 VITALS — BP 116/72 | HR 72 | Temp 96.8°F | Ht 62.75 in | Wt 112.8 lb

## 2015-09-14 DIAGNOSIS — I351 Nonrheumatic aortic (valve) insufficiency: Secondary | ICD-10-CM | POA: Insufficient documentation

## 2015-09-14 DIAGNOSIS — K120 Recurrent oral aphthae: Secondary | ICD-10-CM | POA: Diagnosis not present

## 2015-09-14 DIAGNOSIS — I071 Rheumatic tricuspid insufficiency: Secondary | ICD-10-CM | POA: Diagnosis not present

## 2015-09-14 DIAGNOSIS — I482 Chronic atrial fibrillation, unspecified: Secondary | ICD-10-CM

## 2015-09-14 DIAGNOSIS — Z5181 Encounter for therapeutic drug level monitoring: Secondary | ICD-10-CM | POA: Diagnosis not present

## 2015-09-14 DIAGNOSIS — I340 Nonrheumatic mitral (valve) insufficiency: Secondary | ICD-10-CM | POA: Insufficient documentation

## 2015-09-14 DIAGNOSIS — I110 Hypertensive heart disease with heart failure: Secondary | ICD-10-CM | POA: Insufficient documentation

## 2015-09-14 DIAGNOSIS — R0609 Other forms of dyspnea: Secondary | ICD-10-CM

## 2015-09-14 DIAGNOSIS — I481 Persistent atrial fibrillation: Secondary | ICD-10-CM | POA: Diagnosis not present

## 2015-09-14 DIAGNOSIS — I4891 Unspecified atrial fibrillation: Secondary | ICD-10-CM

## 2015-09-14 DIAGNOSIS — I77810 Thoracic aortic ectasia: Secondary | ICD-10-CM | POA: Insufficient documentation

## 2015-09-14 DIAGNOSIS — I4819 Other persistent atrial fibrillation: Secondary | ICD-10-CM

## 2015-09-14 DIAGNOSIS — I509 Heart failure, unspecified: Secondary | ICD-10-CM | POA: Diagnosis not present

## 2015-09-14 DIAGNOSIS — Z7901 Long term (current) use of anticoagulants: Secondary | ICD-10-CM

## 2015-09-14 DIAGNOSIS — R0600 Dyspnea, unspecified: Secondary | ICD-10-CM | POA: Diagnosis present

## 2015-09-14 LAB — POCT INR: INR: 2.1

## 2015-09-14 MED ORDER — TRIAMCINOLONE ACETONIDE 0.1 % MT PSTE: 1.0000 "application " | PASTE | Freq: Two times a day (BID) | OROMUCOSAL | Status: DC

## 2015-09-14 NOTE — Patient Instructions (Signed)
Avoid using lipsticks/products. Use the steroid paste as directed (if you would like to try this--it will likely improve on its own without any treatment, but if having discomfort, use the medicine).  Return if increasing swelling, pain, drainage, crusting, fevers, or new lesions develop.  Canker Sores Canker sores are small, painful sores that develop inside your mouth. They may also be called aphthous ulcers. You can get canker sores on the inside of your lips or cheeks, on your tongue, or anywhere inside your mouth. You can have just one canker sore or several of them. Canker sores cannot be passed from one person to another (noncontagious). These sores are different than the sores that you may get on the outside of your lips (cold sores or fever blisters). Canker sores usually start as painful red bumps. Then they turn into small white, yellow, or gray ulcers that have red borders. The ulcers may be quite painful. The pain may be worse when you eat or drink. CAUSES The cause of this condition is not known. RISK FACTORS This condition is more likely to develop in:  Women.  People in their teens or 62s.  Women who are having their menstrual period.  People who are under a lot of emotional stress.  People who do not get enough iron or B vitamins.  People who have poor oral hygiene.  People who have an injury inside the mouth. This can happen after having dental work or from chewing something hard. SYMPTOMS Along with the canker sore, symptoms may also include:  Fever.  Fatigue.  Swollen lymph nodes in your neck. DIAGNOSIS This condition can be diagnosed based on your symptoms. Your health care provider will also examine your mouth. Your health care provider may also do tests if you get canker sores often or if they are very bad. Tests may include:  Blood tests to rule out other causes of canker sores.  Taking swabs from the sore to check for infection.  Taking a small  piece of skin from the sore (biopsy) to test it for cancer. TREATMENT Most canker sores clear up without treatment in about 10 days. Home care is usually the only treatment that you will need. Over-the-counter medicines can relieve discomfort.If you have severe canker sores, your health care provider may prescribe:  Numbing ointment to relieve pain.  Vitamins.  Steroid medicines. These may be given as:  Oral pills.  Mouth rinses.  Gels.  Antibiotic mouth rinse. HOME CARE INSTRUCTIONS  Apply, take, or use medicines only as directed by your health care provider. These include vitamins.  If you were prescribed an antibiotic mouth rinse, finish all of it even if you start to feel better.  Until the sores are healed:  Do not drink coffee or citrus juices.  Do not eat spicy or salty foods.  Use a mild, over-the-counter mouth rinse as directed by your health care provider.  Practice good oral hygiene.  Floss your teeth every day.  Brush your teeth with a soft brush twice each day. SEEK MEDICAL CARE IF:  Your symptoms do not get better after two weeks.  You also have a fever or swollen glands.  You get canker sores often.  You have a canker sore that is getting larger.  You cannot eat or drink due to your canker sores.   This information is not intended to replace advice given to you by your health care provider. Make sure you discuss any questions you have with your health care  provider.   Document Released: 10/27/2010 Document Revised: 11/16/2014 Document Reviewed: 06/02/2014 Elsevier Interactive Patient Education [employer].

## 2015-09-14 NOTE — Progress Notes (Signed)
Chief Complaint  Patient presents with  . Lip infection    woke up with her bottom lip swollen and has an infection. Does hurt but not itchy.    Yesterday she woke up and noticed swelling in the central portion of the lower lip.  It appears white, and feels sore/painful.  Denies any new lip products, toothpaste, mouthwash, burn (sun, burn on hot foods). She tried using chapstick, vaseline, neither of which helped. Denies any change since yesterday. It was perhaps a little more swollen yesterday.  H/o cold sore just once in the past--this seems different.  Denies URI symptoms, fever, chills, just her usual chronic runny nose.  Denies ear pain, sore throat, cough.  PMH, PSH, SH reviewed. On warfarin  Outpatient Encounter Prescriptions as of 09/14/2015  Medication Sig  . acetaminophen (TYLENOL) 500 MG tablet Take 1,000 mg by mouth every 4 (four) hours as needed for pain. Reported on 09/14/2015  . furosemide (LASIX) 20 MG tablet Take 1 tablet (20 mg total) by mouth daily.  . prazosin (MINIPRESS) 2 MG capsule Take 1 capsule (2 mg total) by mouth at bedtime.  . propranolol ER (INDERAL LA) 160 MG SR capsule Take 1 capsule (160 mg total) by mouth at bedtime.  Marland Kitchen spironolactone (ALDACTONE) 25 MG tablet TAKE ONE HALF TABLET BY MOUTH EVERY MORNING  . warfarin (COUMADIN) 5 MG tablet Take 0.5-1 tablets (2.5-5 mg total) by mouth daily. Take 1/2 tablet (dose=2.5mg ) po daily.  Per patient: as directed by [facility]/Dr Nahser)   No facility-administered encounter medications on file as of 09/14/2015.    Allergies  Allergen Reactions  . Atorvastatin     Muscle aches  . Amiodarone Hcl     REACTION: Nausea/Vomiting; decrease appetite; affects liver enzymes  . Diltiazem Hcl     REACTION: Nausea/vomitting  . Flecainide Acetate     REACTION: Intol: nausea/horrible feeling  . Metoprolol Succinate     REACTION: Nausea/vomiting;dizziness  . Sotalol Hcl     REACTION: Nausea/vomiting    ROS: no  fever, chills, headaches, dizziness, URI symptoms, sore throat, ear pain. No bleeding, bruising, chest pain or other complaints except as noted in HPI.  PHYSICAL EXAM: BP 116/72 mmHg  Pulse 72  Temp(Src) 96.8 F (36 C) (Tympanic)  Ht 5' 2.75" (1.594 m)  Wt 112 lb 12.8 oz (51.166 kg)  BMI 20.14 kg/m2   Well-appearing, pleasant female in no distress. In the central portion of the lower lip, right on the top, there is a 65mm area that is white, somewhat macerated--appears like an aphthous ulcer (that one would expect on the mucosa surface, not on the top of the lip). There is no surrounding redness, no crusting.  Only minimal soft tissue swelling. OP is clear, no lesions or ulcers noted, moist mucus membranes Neck: no lymphadenopathy or mass  ASSESSMENT/PLAN:  Aphthous ulcer - Plan: triamcinolone (KENALOG) 0.1 % paste    Appears to be an aphthous ulcer, but in a somewhat unusual location.  I would more suspect a contact/allergic reaction, but there is nothing in the history to support that. Can continue with just conservative measures and no treatment, vs trial of topical steroid.   Avoid using lipsticks/products. Use the steroid paste as directed (if you would like to try this--it will likely improve on its own without any treatment, but if having discomfort, use the medicine).  Return if increasing swelling, pain, drainage, crusting, fevers, or new lesions develop.

## 2015-09-16 ENCOUNTER — Telehealth: Payer: Self-pay | Admitting: Cardiovascular Disease

## 2015-09-16 NOTE — Telephone Encounter (Signed)
New message ° ° ° ° ° °Returning a call to the nurse to get echo results °

## 2015-09-16 NOTE — Telephone Encounter (Signed)
The pt has been advised of her Echo results and she verbalized understanding.

## 2015-10-03 ENCOUNTER — Encounter: Payer: Self-pay | Admitting: Family Medicine

## 2015-10-03 ENCOUNTER — Ambulatory Visit (INDEPENDENT_AMBULATORY_CARE_PROVIDER_SITE_OTHER): Payer: Medicare Other | Admitting: Family Medicine

## 2015-10-03 VITALS — BP 148/82 | HR 88 | Temp 97.5°F | Wt 110.0 lb

## 2015-10-03 DIAGNOSIS — J069 Acute upper respiratory infection, unspecified: Secondary | ICD-10-CM | POA: Diagnosis not present

## 2015-10-03 DIAGNOSIS — K529 Noninfective gastroenteritis and colitis, unspecified: Secondary | ICD-10-CM | POA: Diagnosis not present

## 2015-10-03 MED ORDER — BENZONATATE 100 MG PO CAPS: 200.0000 mg | ORAL_CAPSULE | Freq: Three times a day (TID) | ORAL | Status: DC | PRN

## 2015-10-03 NOTE — Patient Instructions (Signed)
Take the Imodium for the diarrhea and use that nausea pill. I'll call in a pill for the coughing

## 2015-10-03 NOTE — Progress Notes (Signed)
   Subjective:    Patient ID: [name] [name], female    DOB: 1932/02/10, 82 y.o.   MRN: [id number]  HPI 4 days ago she developed rhinorrhea with a dry cough but no fever, chills, sore throat or earache. Yesterday she then developed some nausea with diarrhea as well as abdominal discomfort. She has had several loose stools last night and this morning.   Review of Systems     Objective:   Physical Exam Alert and in no distress. Tympanic membranes and canals are normal. Pharyngeal area is normal. Neck is supple without adenopathy or thyromegaly. Cardiac exam shows a regular sinus rhythm without murmurs or gallops. Lungs are clear to auscultation. Abdominal exam shows no masses or tenderness with normal bowel sounds       Assessment & Plan:  Acute URI - Plan: benzonatate (TESSALON PERLES) 100 MG capsule  Acute gastroenteritis I will treat her symptoms as if she has 2 viral infections. Tessalon Perles, Tylenol. Also recommend Imodium and apparently she has samples of Zofran to help with the nausea. She will call if further difficulty.

## 2015-10-04 ENCOUNTER — Other Ambulatory Visit (HOSPITAL_COMMUNITY): Payer: Self-pay | Admitting: Interventional Radiology

## 2015-10-04 DIAGNOSIS — IMO0001 Reserved for inherently not codable concepts without codable children: Secondary | ICD-10-CM

## 2015-10-04 DIAGNOSIS — T82330D Leakage of aortic (bifurcation) graft (replacement), subsequent encounter: Secondary | ICD-10-CM

## 2015-10-05 ENCOUNTER — Telehealth: Payer: Self-pay | Admitting: *Deleted

## 2015-10-05 ENCOUNTER — Other Ambulatory Visit: Payer: Self-pay | Admitting: *Deleted

## 2015-10-05 DIAGNOSIS — T82330A Leakage of aortic (bifurcation) graft (replacement), initial encounter: Secondary | ICD-10-CM

## 2015-10-05 DIAGNOSIS — IMO0002 Reserved for concepts with insufficient information to code with codable children: Secondary | ICD-10-CM

## 2015-10-05 NOTE — Telephone Encounter (Signed)
Patient called to inform [facility] that she is taking tessalon perles 100mg  twice a day for a cough. Advised that the medication does not interfere with Coumadin. Also, she stated while she was out of town she has some nausea and diarrhea. Sh eis taking immodium and zofran as needed.  Advised that diarrhea can cause her INR to elevate and we would like to schedule an appt. She stated that the diarrhea has decreased and she is feeling better.  Advised to call back with any other issues and she verbalized understanding.

## 2015-10-10 ENCOUNTER — Ambulatory Visit (INDEPENDENT_AMBULATORY_CARE_PROVIDER_SITE_OTHER): Payer: Medicare Other | Admitting: Family Medicine

## 2015-10-10 ENCOUNTER — Encounter: Payer: Self-pay | Admitting: Family Medicine

## 2015-10-10 VITALS — BP 110/78 | HR 80 | Ht 62.75 in | Wt 109.0 lb

## 2015-10-10 DIAGNOSIS — I4819 Other persistent atrial fibrillation: Secondary | ICD-10-CM

## 2015-10-10 DIAGNOSIS — I481 Persistent atrial fibrillation: Secondary | ICD-10-CM

## 2015-10-10 DIAGNOSIS — J209 Acute bronchitis, unspecified: Secondary | ICD-10-CM

## 2015-10-10 MED ORDER — AZITHROMYCIN 500 MG PO TABS
500.0000 mg | ORAL_TABLET | Freq: Every day | ORAL | Status: DC
Start: 2015-10-10 — End: 2015-10-17

## 2015-10-10 NOTE — Patient Instructions (Signed)
Check with the Coumadin clinic and see if they want to readjust your medicines while you're on this if you're not totally back to normal and a week call me

## 2015-10-10 NOTE — Progress Notes (Signed)
   Subjective:    Patient ID: [name] [name], female    DOB: 08/19/1931, 82 y.o.   MRN: [id number]  HPI She is here for evaluation of continued difficulty with cough, congestion, chest discomfort especially with coughing and occasional wheezing.no fever, chills, sore throat and some left ear congestion.She does have underlying atrial fibrillation and is on Coumadin.   Review of Systems     Objective:   Physical Exam Alert and in no distress. Tympanic membranes and canals are normal. Pharyngeal area is normal. Neck is supple without adenopathy or thyromegaly. Cardiac exam shows an irregular sinus rhythm without murmurs or gallops. Lungs show rhonchi.        Assessment & Plan:  Persistent atrial fibrillation (HCC)  Acute bronchitis, unspecified organism - Plan: azithromycin (ZITHROMAX) 500 MG tablet I will place her on azithromycin. Encouraged her to call the Coumadin clinic to see if they want to readjust her medication. She has also call me in 1 week if not totally back to normal.

## 2015-10-13 ENCOUNTER — Ambulatory Visit (INDEPENDENT_AMBULATORY_CARE_PROVIDER_SITE_OTHER): Payer: Medicare Other | Admitting: *Deleted

## 2015-10-13 DIAGNOSIS — I4891 Unspecified atrial fibrillation: Secondary | ICD-10-CM

## 2015-10-13 DIAGNOSIS — I4819 Other persistent atrial fibrillation: Secondary | ICD-10-CM

## 2015-10-13 DIAGNOSIS — Z7901 Long term (current) use of anticoagulants: Secondary | ICD-10-CM | POA: Diagnosis not present

## 2015-10-13 DIAGNOSIS — Z5181 Encounter for therapeutic drug level monitoring: Secondary | ICD-10-CM | POA: Diagnosis not present

## 2015-10-13 DIAGNOSIS — I481 Persistent atrial fibrillation: Secondary | ICD-10-CM | POA: Diagnosis not present

## 2015-10-13 LAB — POCT INR: INR: 2.5

## 2015-10-17 ENCOUNTER — Other Ambulatory Visit: Payer: Self-pay | Admitting: Pharmacist

## 2015-10-17 MED ORDER — WARFARIN SODIUM 5 MG PO TABS: ORAL_TABLET | ORAL | Status: DC

## 2015-10-20 DIAGNOSIS — T82330A Leakage of aortic (bifurcation) graft (replacement), initial encounter: Secondary | ICD-10-CM | POA: Diagnosis not present

## 2015-10-21 LAB — CREATININE WITH EST GFR
CREATININE: 1.09 mg/dL — AB (ref 0.60–0.88)
GFR, Est African American: 55 mL/min — ABNORMAL LOW (ref >=60)
GFR, Est Non African American: 48 mL/min — ABNORMAL LOW (ref >=60)

## 2015-10-21 LAB — BUN: BUN: 27 mg/dL — AB (ref 7–25)

## 2015-10-27 ENCOUNTER — Ambulatory Visit
Admission: RE | Admit: 2015-10-27 | Discharge: 2015-10-27 | Disposition: A | Discharge status: Home / Self Care | Payer: Medicare Other | Source: Ambulatory Visit | Attending: Interventional Radiology | Admitting: Interventional Radiology

## 2015-10-27 ENCOUNTER — Ambulatory Visit (HOSPITAL_COMMUNITY)
Admission: RE | Admit: 2015-10-27 | Discharge: 2015-10-27 | Disposition: A | Discharge status: Home / Self Care | Payer: Medicare Other | Source: Ambulatory Visit | Attending: Interventional Radiology | Admitting: Interventional Radiology

## 2015-10-27 ENCOUNTER — Encounter (HOSPITAL_COMMUNITY): Payer: Self-pay

## 2015-10-27 DIAGNOSIS — Z9889 Other specified postprocedural states: Secondary | ICD-10-CM | POA: Insufficient documentation

## 2015-10-27 DIAGNOSIS — T82330D Leakage of aortic (bifurcation) graft (replacement), subsequent encounter: Secondary | ICD-10-CM | POA: Diagnosis not present

## 2015-10-27 DIAGNOSIS — IMO0001 Reserved for inherently not codable concepts without codable children: Secondary | ICD-10-CM

## 2015-10-27 DIAGNOSIS — I712 Thoracic aortic aneurysm, without rupture: Secondary | ICD-10-CM | POA: Diagnosis not present

## 2015-10-27 MED ORDER — IOPAMIDOL (ISOVUE-370) INJECTION 76%
100.0000 mL | Freq: Once | INTRAVENOUS | Status: AC | PRN
  Administered 2015-10-27: 80 mL via INTRAVENOUS

## 2015-10-27 NOTE — Progress Notes (Signed)
Chief Complaint: Patient was seen in follow-up today for  Chief Complaint  Patient presents with  . Follow-up    Type 2 Endoleak repair   at the request of Chelsa Stout  Referring Physician(s): Yancey Pedley  History of Present Illness: [name] [name] is a 82 y.o. female with a large descending thoracic aneurysm previously repaired by [facility]EV[facility]. She had an endoleak and definite growth of the sac over time. She underwent a direct sac puncture repair on 8/ 31/16. AT that time, angiography revealed what we expected to be a Type 2 endoleak was actually a Type 1b. Thanks to fortuitous bottle-necking of the endoleak entry site into the sac, we were able to place a coil and Onyx liquid embolic plug into this bottle neck which we felt repaired the endoleak.   She is doing very well today and is essentially clinically asymptomatic. She was able to fly to [place] recently for one of her grandchildren's wedding. After returning home she came down with a case of severe bronchitis but has subsequently recovered.  CTA imaging demonstrates no evidence of residual endoleak and a stable to slightly decreased excluded aneurysm sac.  Past Medical History  Diagnosis Date  . AF (atrial fibrillation) ([place])   . Hypertension   . Aortic insufficiency   . Mitral regurgitation   . Tricuspid regurgitation   . CHF (congestive heart failure) ([place])   . Aortic aneurysm ([place])   . CAD (coronary artery disease)   . Arthritis   . Claustrophobia   . Shortness of breath   . Broken neck ([place])     40 years ago  . Broken clavicle August 2016  . Dysrhythmia   . Heart murmur   . COPD (chronic obstructive pulmonary disease) ([place])   . History of pneumonia   . History of bronchitis   . Urinary incontinence   . Urinary frequency   . GERD (gastroesophageal reflux disease)   . Wears glasses   . Anemia     Past Surgical History  Procedure Laterality Date  . Tonsillectomy    . Cystectomy      sacrum  .  Thoracic aortic endovascular stent graft N/A 10/02/2012    Procedure: THORACIC AORTIC ENDOVASCULAR STENT GRAFT;  Surgeon: Serafina Mitchell, MD;  Location: [place];  Service: Vascular;  Laterality: N/A;  Ultrasound guided  . Thoracic aortic endovascular stent graft N/A 10/05/2012    Procedure: Endovascular repair of THORACIC AORTIC ANEURYSM;  Surgeon: Serafina Mitchell, MD;  [facility];  Service: Vascular;  Laterality: N/A;  Angioplasty of Aortic throasic aneurysm, x1 aortagram,  Percutaneous acess right femoral artery.  . Cardiac catheterization    . Radiology with anesthesia N/A 03/16/2015    Procedure: Endoleak Rapair;  Surgeon: Jacqulynn Cadet, MD;  Location: [place];  Service: Radiology;  Laterality: N/A;    Allergies: Atorvastatin; Amiodarone hcl; Diltiazem hcl; Flecainide acetate; Metoprolol succinate; and Sotalol hcl  Medications: Prior to Admission medications   Medication Sig Start Date End Date Taking? Authorizing Provider  acetaminophen (TYLENOL) 500 MG tablet Take 1,000 mg by mouth every 4 (four) hours as needed for pain. Reported on 09/14/2015   Yes Historical Provider, MD  furosemide (LASIX) 20 MG tablet Take 1 tablet (20 mg total) by mouth daily. 08/24/15 10/08/16 Yes Thayer Headings, MD  prazosin (MINIPRESS) 2 MG capsule Take 1 capsule (2 mg total) by mouth at bedtime. 08/24/15  Yes Thayer Headings, MD  propranolol ER (INDERAL LA) 160 MG SR capsule  Take 1 capsule (160 mg total) by mouth at bedtime. 08/24/15  Yes Thayer Headings, MD  spironolactone (ALDACTONE) 25 MG tablet TAKE ONE HALF TABLET BY MOUTH EVERY MORNING 08/24/15  Yes Thayer Headings, MD  triamcinolone (KENALOG) 0.1 % paste Use as directed 1 application in the mouth or throat 2 (two) times daily. Patient not taking: Reported on 10/27/2015 09/14/15   Rita Ohara, MD  warfarin (COUMADIN) 5 MG tablet Take as directed by Coumadin clinic. 10/17/15   Thayer Headings, MD     Family History  Problem Relation Age of Onset  . Aneurysm Mother 64   . Cancer Mother     breast  . Heart disease Mother   . Hypertension Mother   . Hodgkin's lymphoma Father 3  . Cancer Father     hodgkins    Social History   Social History  . Marital Status: Widowed    Spouse Name: N/A  . Number of Children: 3  . Years of Education: N/A   Occupational History  . Architect    Social History Main Topics  . Smoking status: Former Smoker -- 1.00 packs/day for 20 years    Types: Cigarettes    Quit date: 08/22/1985  . Smokeless tobacco: Never Used  . Alcohol Use: 5.4 oz/week    9 Glasses of wine per week     Comment: a glass of wine every night  . Drug Use: No  . Sexual Activity: No   Other Topics Concern  . Not on file   Social History Narrative   Review of Systems: A 12 point ROS discussed and pertinent positives are indicated in the HPI above.  All other systems are negative.  Review of Systems  Vital Signs: BP 102/58 mmHg  Pulse 71  Temp(Src) 97.6 F (36.4 C) (Oral)  Resp 14  SpO2 96%  Physical Exam  Constitutional: She is oriented to person, place, and time. She appears well-developed and well-nourished.  HENT:  Head: Normocephalic and atraumatic.  Eyes: No scleral icterus.  Cardiovascular: Normal rate.   Pulmonary/Chest: Effort normal and breath sounds normal.  Abdominal: Soft. She exhibits no distension. There is no tenderness.  Neurological: She is alert and oriented to person, place, and time.  Skin: Skin is warm and dry.  Psychiatric: She has a normal mood and affect. Her behavior is normal.  Nursing note and vitals reviewed.   Imaging: No results found.  Labs:  CBC:  Recent Labs  03/11/15 1303 03/16/15 1117  WBC 13.1* 9.1  HGB 14.1 12.5  HCT 42.4 37.9  PLT 342 327    COAGS:  Recent Labs  03/16/15 1117  06/29/15 1228 08/03/15 1246 09/14/15 1140 10/13/15 1155  INR 1.11  < > 2.3 2.2 2.1 2.5  APTT 34  --   --   --   --   --   < > = values in this interval not displayed.  BMP:  Recent  Labs  01/18/15 1009 01/18/15 1011 03/11/15 1303 10/20/15 1348  NA  --   --  134*  --   K  --   --  3.6  --   CL  --   --  97*  --   CO2  --   --  26  --   GLUCOSE  --   --  122*  --   BUN 36*  --  26* 27*  CALCIUM  --   --  9.3  --   CREATININE  --  1.20* 1.14* 1.09*  GFRNONAA  --   --  44* 48*  GFRAA  --   --  51* 55*    LIVER FUNCTION TESTS: No results for input(s): BILITOT, AST, ALT, ALKPHOS, PROT, ALBUMIN in the last 8760 hours.  TUMOR MARKERS: No results for input(s): AFPTM, CEA, CA199, CHROMGRNA in the last 8760 hours.  Assessment and Plan:  Clinically doing very well. She looks excellent today.  CTA imaging demonstrates no evidence of residual endoleak and a stable to slightly decreased thoracic aneurysm sac. It appears that our endovascular repair of her type I b endoleak continues to be effective.  1.) Repeat CTA and clinic visit in 1 year   Electronically Signed: [place], Spelter 10/27/2015, 2:41 PM   I spent a total of 15 Minutes in face to face in clinical consultation, greater than 50% of which was counseling/coordinating care for Thoracic aortic aneurysm withType I b endoleak

## 2015-11-24 ENCOUNTER — Ambulatory Visit (INDEPENDENT_AMBULATORY_CARE_PROVIDER_SITE_OTHER): Payer: Medicare Other | Admitting: *Deleted

## 2015-11-24 DIAGNOSIS — I4891 Unspecified atrial fibrillation: Secondary | ICD-10-CM | POA: Diagnosis not present

## 2015-11-24 DIAGNOSIS — I481 Persistent atrial fibrillation: Secondary | ICD-10-CM

## 2015-11-24 DIAGNOSIS — Z7901 Long term (current) use of anticoagulants: Secondary | ICD-10-CM

## 2015-11-24 DIAGNOSIS — Z5181 Encounter for therapeutic drug level monitoring: Secondary | ICD-10-CM | POA: Diagnosis not present

## 2015-11-24 DIAGNOSIS — I4819 Other persistent atrial fibrillation: Secondary | ICD-10-CM

## 2015-11-24 LAB — POCT INR: INR: 2.2

## 2015-12-13 ENCOUNTER — Encounter: Payer: Self-pay | Admitting: Family Medicine

## 2015-12-13 ENCOUNTER — Telehealth: Payer: Self-pay | Admitting: Family Medicine

## 2015-12-13 ENCOUNTER — Ambulatory Visit (INDEPENDENT_AMBULATORY_CARE_PROVIDER_SITE_OTHER): Payer: Medicare Other | Admitting: Family Medicine

## 2015-12-13 VITALS — BP 118/60 | HR 70 | Wt 109.0 lb

## 2015-12-13 DIAGNOSIS — T148 Other injury of unspecified body region: Secondary | ICD-10-CM

## 2015-12-13 DIAGNOSIS — W19XXXA Unspecified fall, initial encounter: Secondary | ICD-10-CM

## 2015-12-13 DIAGNOSIS — R42 Dizziness and giddiness: Secondary | ICD-10-CM

## 2015-12-13 DIAGNOSIS — T148XXA Other injury of unspecified body region, initial encounter: Secondary | ICD-10-CM

## 2015-12-13 NOTE — Telephone Encounter (Signed)
Pt wants to know if she can go in the pool in the next day or so

## 2015-12-13 NOTE — Telephone Encounter (Signed)
yes

## 2015-12-13 NOTE — Progress Notes (Signed)
   Subjective:    Patient ID: [name] [name], female    DOB: 13-Oct-1931, 82 y.o.   MRN: [id number]  HPI She is here for evaluation of a fall sustained damage to her right shin. She apparently was sitting in a chair and got up and felt the right leg being weak. She then fell contusing and abrading the right shin. She did state that she has had difficulty with dizziness for the last several years but has not mentioned this in the past. She does complain of right leg weakness.   Review of Systems     Objective:   Physical Exam alert and in no distress. The right anterior shin does show evidence of an abrasion but no evidence of infection. She does have some distal swelling in the area of the ankle. Deep tendon reflex was normal in the knee and minimal in the ankle. Motor strength was questionable with dorsiflexion however she was having pain in the ankle.      Assessment & Plan:  Contusion  Dizziness  Fall, initial encounter She will keep the area clean and dry. I will reevaluate her in 2 weeks. She did not mention the falls in the past and this will need to be more thoroughly evaluated.

## 2015-12-14 DIAGNOSIS — D045 Carcinoma in situ of skin of trunk: Secondary | ICD-10-CM | POA: Diagnosis not present

## 2015-12-14 DIAGNOSIS — L814 Other melanin hyperpigmentation: Secondary | ICD-10-CM | POA: Diagnosis not present

## 2015-12-14 DIAGNOSIS — L821 Other seborrheic keratosis: Secondary | ICD-10-CM | POA: Diagnosis not present

## 2015-12-14 DIAGNOSIS — C44529 Squamous cell carcinoma of skin of other part of trunk: Secondary | ICD-10-CM | POA: Diagnosis not present

## 2015-12-14 DIAGNOSIS — D692 Other nonthrombocytopenic purpura: Secondary | ICD-10-CM | POA: Diagnosis not present

## 2015-12-14 DIAGNOSIS — D485 Neoplasm of uncertain behavior of skin: Secondary | ICD-10-CM | POA: Diagnosis not present

## 2015-12-14 DIAGNOSIS — D1801 Hemangioma of skin and subcutaneous tissue: Secondary | ICD-10-CM | POA: Diagnosis not present

## 2015-12-14 DIAGNOSIS — D2271 Melanocytic nevi of right lower limb, including hip: Secondary | ICD-10-CM | POA: Diagnosis not present

## 2015-12-14 NOTE — Telephone Encounter (Signed)
Pt advised and verbalized understanding.

## 2015-12-19 ENCOUNTER — Encounter: Payer: Self-pay | Admitting: Family Medicine

## 2015-12-19 ENCOUNTER — Ambulatory Visit (INDEPENDENT_AMBULATORY_CARE_PROVIDER_SITE_OTHER): Payer: Medicare Other | Admitting: Family Medicine

## 2015-12-19 VITALS — BP 130/82 | HR 64 | Temp 97.4°F | Ht 62.75 in | Wt 108.6 lb

## 2015-12-19 DIAGNOSIS — Z7901 Long term (current) use of anticoagulants: Secondary | ICD-10-CM

## 2015-12-19 DIAGNOSIS — R58 Hemorrhage, not elsewhere classified: Secondary | ICD-10-CM | POA: Diagnosis not present

## 2015-12-19 DIAGNOSIS — T148XXA Other injury of unspecified body region, initial encounter: Secondary | ICD-10-CM

## 2015-12-19 DIAGNOSIS — T148 Other injury of unspecified body region: Secondary | ICD-10-CM | POA: Diagnosis not present

## 2015-12-19 NOTE — Patient Instructions (Signed)
Please apply antibacterial ointment to the open areas of skin. This is to help prevent any infection. It appears to be healing well. The circulation in the toes is normal.  The discoloration is related to bruising (tracking down the foot from gravity, from the wound farther up the leg).

## 2015-12-19 NOTE — Progress Notes (Signed)
Chief Complaint  Patient presents with  . Foot Injury    had a fall at the pool 5/27, saw [facility] 5/30. Is on coumadin and wound bled up until yeterday when she decided to take bandage off and let air to the wound area. Is having some discomfort around wound area and esp at the outer right ankle area and right foot (toes) are bruised, purple and this is very concerning to her.    She presents for re-evaluation of her RLE due to her toes being purple.  A wound nurse scared her (that it could be a circulatory problem) and so presents for evaluation. She denies any pain or injury to the toes of the right foot.  The wound/injury is at the right shin.  It had been bleeding ongoing until yesterday.  She left it open to air, elevated the leg and reports that it finally scabbed and is no longer bleeding. She denies any redness, fever, purulent drainage or leg pain.  She has noticed purple discoloration of her toes, but also bruising of the right foot that has been improving. She denies any pain or swelling of the foot/toes, with no direct injury to the foot.  Last protime/INR was 2.2 on 5/11  Denies feeling dizzy/lightheaded (more than the slight amount that is normal for her).  PMH,  PSH, SH reviewed.  Outpatient Encounter Prescriptions as of 12/19/2015  Medication Sig Note  . acetaminophen (TYLENOL) 500 MG tablet Take 1,000 mg by mouth every 4 (four) hours as needed for pain. Reported on 09/14/2015   . furosemide (LASIX) 20 MG tablet Take 1 tablet (20 mg total) by mouth daily.   . prazosin (MINIPRESS) 2 MG capsule Take 1 capsule (2 mg total) by mouth at bedtime.   . propranolol ER (INDERAL LA) 160 MG SR capsule Take 1 capsule (160 mg total) by mouth at bedtime.   Marland Kitchen spironolactone (ALDACTONE) 25 MG tablet TAKE ONE HALF TABLET BY MOUTH EVERY MORNING   . warfarin (COUMADIN) 5 MG tablet Take as directed by Coumadin clinic. 12/19/2015: 2.5mg  daily   No facility-administered encounter medications on file as of  12/19/2015.   Allergies  Allergen Reactions  . Atorvastatin     Muscle aches  . Amiodarone Hcl     REACTION: Nausea/Vomiting; decrease appetite; affects liver enzymes  . Diltiazem Hcl     REACTION: Nausea/vomitting  . Flecainide Acetate     REACTION: Intol: nausea/horrible feeling  . Metoprolol Succinate     REACTION: Nausea/vomiting;dizziness  . Sotalol Hcl     REACTION: Nausea/vomiting   ROS: no fever, chills, headache, no change in mild baseline dizziness, no chest pain, palpitations, shortness of breath, calf pain/claudication, foot pain, edema.  No further bleeding. See HPI  PHYSICAL EXAM: BP 130/82 mmHg  Pulse 64  Temp(Src) 97.4 F (36.3 C) (Tympanic)  Ht 5' 2.75" (1.594 m)  Wt 108 lb 9.6 oz (49.261 kg)  BMI 19.39 kg/m2  Well developed, pleasant, elderly female in no distress RLE: 2 open, non-bleeding wounds on the right anterior shin Top one meausres 4 x 1.5cm, and just below that is 4 x 2cm.  There is no crusting, erythema, drainage or bleeding.  +eschar.  No surrounding erythema or soft tissue swelling. The right foot as mild ecchymosis (light yellow-green) to most of the foot, mostly on the lateral side. She does have some purplish discoloration of the 2nd through 5th toes (not entire toe, just portions of them), and the middle phalanx. The distal phalynx  appears normal.  There is brisk capillary refill, no swelling, nontender to palpation.   ASSESSMENT/PLAN:  Ecchymosis - of toes.  no sign of embolic disease; bruising related to shin injury, draining down; reassurred.  Current use of long term anticoagulation  Abrasion - of right shin, with skin tear.  Healing.

## 2015-12-20 ENCOUNTER — Encounter: Payer: Self-pay | Admitting: Family Medicine

## 2015-12-26 DIAGNOSIS — Z85828 Personal history of other malignant neoplasm of skin: Secondary | ICD-10-CM | POA: Diagnosis not present

## 2015-12-26 DIAGNOSIS — D045 Carcinoma in situ of skin of trunk: Secondary | ICD-10-CM | POA: Diagnosis not present

## 2015-12-26 DIAGNOSIS — C44529 Squamous cell carcinoma of skin of other part of trunk: Secondary | ICD-10-CM | POA: Diagnosis not present

## 2015-12-27 ENCOUNTER — Telehealth: Payer: Self-pay | Admitting: *Deleted

## 2015-12-27 NOTE — Telephone Encounter (Signed)
Spoke with pt who states has wound on right leg and has been ordered Mupirocin Oint 2 % to apply to wound daily and is calling to see if has any interaction with coumadin and pt instructed that there is no interaction between coumadin and Mupirocin and to continue to take her coumadin as ordered and to use the Mupirocin Oint as ordered and she states understanding

## 2015-12-28 ENCOUNTER — Ambulatory Visit (INDEPENDENT_AMBULATORY_CARE_PROVIDER_SITE_OTHER): Payer: Medicare Other | Admitting: Family Medicine

## 2015-12-28 VITALS — BP 100/60

## 2015-12-28 DIAGNOSIS — T148XXA Other injury of unspecified body region, initial encounter: Secondary | ICD-10-CM

## 2015-12-28 DIAGNOSIS — T148 Other injury of unspecified body region: Secondary | ICD-10-CM

## 2015-12-28 DIAGNOSIS — W19XXXD Unspecified fall, subsequent encounter: Secondary | ICD-10-CM | POA: Diagnosis not present

## 2015-12-28 NOTE — Progress Notes (Signed)
   Subjective:    Patient ID: [name] [name], female    DOB: 1931-10-03, 82 y.o.   MRN: [id number]  HPI She is here for a recheck. The abrasion on her leg seems to be healing nicely. She has no particular concerns over this. Further history concerning her falls indicates she has had 2 falls in the last year. Both falls were precipitated by one being from a charley horse causing her to fall another was tripping over an object. She has stopped exercising on a regular basis and does plan to get back into this. She's had no numbness, tingling, blurred or double vision.   Review of Systems     Objective:   Physical Exam Exam of the shin area does show 2 healing lesions, one with an eschar and 1 still draining some clear fluid. It is not hot or red or tender.       Assessment & Plan:  Abrasion  Falls, subsequent encounter Continue to treat the shin abrasion conservatively. At this point I do not feel she needs further workup with her falls but did strongly encourage her to get involved in a regular exercise program. She apparently does have a cane and does use this occasionally. Recheck in one month as I am concerned over the abrasion healing appropriately and the possibility of an eschar needing to be revised

## 2016-01-04 ENCOUNTER — Ambulatory Visit (INDEPENDENT_AMBULATORY_CARE_PROVIDER_SITE_OTHER): Payer: Medicare Other | Admitting: *Deleted

## 2016-01-04 DIAGNOSIS — Z7901 Long term (current) use of anticoagulants: Secondary | ICD-10-CM | POA: Diagnosis not present

## 2016-01-04 DIAGNOSIS — Z5181 Encounter for therapeutic drug level monitoring: Secondary | ICD-10-CM

## 2016-01-04 DIAGNOSIS — I4891 Unspecified atrial fibrillation: Secondary | ICD-10-CM

## 2016-01-04 DIAGNOSIS — I481 Persistent atrial fibrillation: Secondary | ICD-10-CM

## 2016-01-04 DIAGNOSIS — I4819 Other persistent atrial fibrillation: Secondary | ICD-10-CM

## 2016-01-04 LAB — POCT INR: INR: 2.2

## 2016-01-25 ENCOUNTER — Encounter: Payer: Self-pay | Admitting: Family Medicine

## 2016-01-25 ENCOUNTER — Ambulatory Visit (INDEPENDENT_AMBULATORY_CARE_PROVIDER_SITE_OTHER): Payer: Medicare Other | Admitting: Family Medicine

## 2016-01-25 VITALS — BP 110/60 | HR 91 | Wt 107.2 lb

## 2016-01-25 DIAGNOSIS — T148 Other injury of unspecified body region: Secondary | ICD-10-CM | POA: Diagnosis not present

## 2016-01-25 DIAGNOSIS — W19XXXD Unspecified fall, subsequent encounter: Secondary | ICD-10-CM

## 2016-01-25 DIAGNOSIS — T148XXA Other injury of unspecified body region, initial encounter: Secondary | ICD-10-CM

## 2016-01-25 NOTE — Progress Notes (Signed)
   Subjective:    Patient ID: [name] [name], female    DOB: 10/20/1931, 82 y.o.   MRN: [id number]  HPI Se is here for recheck concerning the abrasions on her legs. She also sustained an abrasion the other day when she apparently hit the dishwasher. She does not complain of pain, swelling or redness.. Review of Systems     Objective:   Physical Exam She does have an eschar present on the left shin that was removed easily. The underlying skin appears like it is healing normally. She does have a recent wound to the right lower extremity with some dried blood.       Assessment & Plan:  Abrasion  Falls, subsequent encounter Continue To treat this conservatively. Encouraged her to use sunscreens when she is outside.

## 2016-02-15 ENCOUNTER — Ambulatory Visit (INDEPENDENT_AMBULATORY_CARE_PROVIDER_SITE_OTHER): Payer: Medicare Other | Admitting: *Deleted

## 2016-02-15 DIAGNOSIS — Z7901 Long term (current) use of anticoagulants: Secondary | ICD-10-CM | POA: Diagnosis not present

## 2016-02-15 DIAGNOSIS — I4891 Unspecified atrial fibrillation: Secondary | ICD-10-CM

## 2016-02-15 DIAGNOSIS — Z5181 Encounter for therapeutic drug level monitoring: Secondary | ICD-10-CM | POA: Diagnosis not present

## 2016-02-15 LAB — POCT INR: INR: 2.5

## 2016-03-12 ENCOUNTER — Other Ambulatory Visit: Payer: Self-pay | Admitting: *Deleted

## 2016-03-12 ENCOUNTER — Encounter: Payer: Self-pay | Admitting: Cardiovascular Disease

## 2016-03-12 ENCOUNTER — Ambulatory Visit (INDEPENDENT_AMBULATORY_CARE_PROVIDER_SITE_OTHER): Payer: Medicare Other | Admitting: Cardiovascular Disease

## 2016-03-12 VITALS — BP 128/86 | HR 78 | Ht 62.0 in | Wt 109.4 lb

## 2016-03-12 DIAGNOSIS — I482 Chronic atrial fibrillation, unspecified: Secondary | ICD-10-CM

## 2016-03-12 DIAGNOSIS — I351 Nonrheumatic aortic (valve) insufficiency: Secondary | ICD-10-CM

## 2016-03-12 DIAGNOSIS — I272 Other secondary pulmonary hypertension: Secondary | ICD-10-CM

## 2016-03-12 DIAGNOSIS — I10 Essential (primary) hypertension: Secondary | ICD-10-CM | POA: Diagnosis not present

## 2016-03-12 MED ORDER — WARFARIN SODIUM 5 MG PO TABS: ORAL_TABLET | ORAL | 1 refills | Status: DC

## 2016-03-12 NOTE — Progress Notes (Signed)
Cardiology Office Note   Date:  03/12/2016   ID:  [name] [name], DOB 08/16/1931, MRN [id number]  PCP:  [name], MD  Cardiologist:   Mertie Moores, MD   Chief Complaint  Patient presents with  . Atrial Fibrillation   Problem List:  1. Atrial fibrillation-we have tried her on Rythmol, flecainide, Sotalol, and amiodarone. Her insurance company will not pay for [name]. 2. Hypertension 3. Thoracic aortic aneurism [employer]) - s/p stent graft repair. Complicated by a type III endoleak. Repaired with another stenting.  4. Pulmonary Hypertension - moderate , est. PA pressures of 55.   August 25, 2014   [name] [name] is a 82 y.o. female who presents for her atrial fib.  Same symptoms.  Still short of breath.  Has lots of fatigue with any exercise. She continues to have Afib.  Has tried multiple meds.  Has had lots of back pain - since her thoracic aortic surgery .  She gets very fatigued with any sort of walking or exercise  February 23, 2015:  Still having lots of back pain - has lost her balance.  Is short of breath also  Has fallen on several occasions  Needs to have vascular surgery later this month.   Her stent graft has an endoleak .   Feb. 8, 2017  Doing ok Still has DOE - possibly a bit worse than before  Still in atrial fib   Has had an aortic aneurism stent graft repair.  Had an endoleak, with repair.     Aug. 28 , 2017: Doing about the same.  Breathing is still short - slightly worse Has had balance issues.  Has not seen neurology     Past Medical History:  Diagnosis Date  . AF (atrial fibrillation) ([place])   . Anemia   . Aortic aneurysm ([place])   . Aortic insufficiency   . Arthritis   . Broken clavicle August 2016  . Broken neck ([place])    40 years ago  . CAD (coronary artery disease)   . CHF (congestive heart failure) ([place])   . Claustrophobia   . COPD (chronic obstructive pulmonary disease) ([place])   . Dysrhythmia   . GERD  (gastroesophageal reflux disease)   . Heart murmur   . History of bronchitis   . History of pneumonia   . Hypertension   . Mitral regurgitation   . Shortness of breath   . Tricuspid regurgitation   . Urinary frequency   . Urinary incontinence   . Wears glasses     Past Surgical History:  Procedure Laterality Date  . CARDIAC CATHETERIZATION    . CYSTECTOMY     sacrum  . RADIOLOGY WITH ANESTHESIA N/A 03/16/2015   Procedure: Endoleak Rapair;  Surgeon: Jacqulynn Cadet, MD;  Location: [place];  Service: Radiology;  Laterality: N/A;  . THORACIC AORTIC ENDOVASCULAR STENT GRAFT N/A 10/02/2012   Procedure: THORACIC AORTIC ENDOVASCULAR STENT GRAFT;  Surgeon: Serafina Mitchell, MD;  Location: [place];  Service: Vascular;  Laterality: N/A;  Ultrasound guided  . THORACIC AORTIC ENDOVASCULAR STENT GRAFT N/A 10/05/2012   Procedure: Endovascular repair of THORACIC AORTIC ANEURYSM;  Surgeon: Serafina Mitchell, MD;  [facility];  Service: Vascular;  Laterality: N/A;  Angioplasty of Aortic throasic aneurysm, x1 aortagram,  Percutaneous acess right femoral artery.  . TONSILLECTOMY       Current Outpatient Prescriptions  Medication Sig Dispense Refill  . acetaminophen (TYLENOL) 500 MG tablet Take 1,000 mg by  mouth every 4 (four) hours as needed for pain. Reported on 09/14/2015    . furosemide (LASIX) 20 MG tablet Take 1 tablet (20 mg total) by mouth daily. 90 tablet 3  . prazosin (MINIPRESS) 2 MG capsule Take 1 capsule (2 mg total) by mouth at bedtime. 30 capsule 11  . propranolol ER (INDERAL LA) 160 MG SR capsule Take 1 capsule (160 mg total) by mouth at bedtime. 30 capsule 11  . spironolactone (ALDACTONE) 25 MG tablet TAKE ONE HALF TABLET BY MOUTH EVERY MORNING 45 tablet 3  . warfarin (COUMADIN) 5 MG tablet Take as directed by Coumadin clinic. 20 tablet 3   No current facility-administered medications for this visit.     Allergies:   Atorvastatin; Amiodarone hcl; Diltiazem hcl; Flecainide acetate;  Metoprolol succinate; and Sotalol hcl    Social History:  The patient  reports that she quit smoking about 30 years ago. Her smoking use included Cigarettes. She has a 20.00 pack-year smoking history. She has never used smokeless tobacco. She reports that she drinks about 5.4 oz of alcohol per week . She reports that she does not use drugs.   Family History:  The patient's family history includes Aneurysm (age of onset: 45) in her mother; Cancer in her father and mother; Heart disease in her mother; Hodgkin's lymphoma (age of onset: 55) in her father; Hypertension in her mother.    ROS:  Please see the history of present illness.    Review of Systems: Constitutional:  denies fever, chills, diaphoresis, appetite change and fatigue.  HEENT: denies photophobia, eye pain, redness, hearing loss, ear pain, congestion, sore throat, rhinorrhea, sneezing, neck pain, neck stiffness and tinnitus.  Respiratory: denies SOB, DOE, cough, chest tightness, and wheezing.  Cardiovascular: denies chest pain, palpitations and leg swelling.  Gastrointestinal: denies nausea, vomiting, abdominal pain, diarrhea, constipation, blood in stool.  Genitourinary: denies dysuria, urgency, frequency, hematuria, flank pain and difficulty urinating.  Musculoskeletal: denies  myalgias, back pain, joint swelling, arthralgias and gait problem.   Skin: denies pallor, rash and wound.  Neurological: denies dizziness, seizures, syncope, weakness, light-headedness, numbness and headaches.   Hematological: denies adenopathy, easy bruising, personal or family bleeding history.  Psychiatric/ Behavioral: denies suicidal ideation, mood changes, confusion, nervousness, sleep disturbance and agitation.       All other systems are reviewed and negative.    PHYSICAL EXAM: VS:  BP 128/86 (BP Location: Right Arm, Patient Position: Sitting, Cuff Size: Normal)   Pulse 78   Ht 5\' 2"  (1.575 m)   Wt 109 lb 6.4 oz (49.6 kg)   BMI 20.01  kg/m  , BMI Body mass index is 20.01 kg/m. GEN: Well nourished, well developed, in no acute distress  HEENT: normal  Neck: no JVD, carotid bruits, or masses Cardiac: irreg. Irreg. ; 2/6 diastolic murmur ,  RV heave  ,no edema  Respiratory:  clear to auscultation bilaterally, normal work of breathing GI: soft, nontender, nondistended, + BS MS: no deformity or atrophy  Skin: warm and dry, no rash Neuro:  Strength and sensation are intact Psych: normal   EKG:  EKG is ordered today. The ekg ordered today demonstrates atrial fib with rate of 86    Recent Labs: 03/16/2015: Hemoglobin 12.5; Platelets 327 10/20/2015: BUN 27; Creat 1.09    Lipid Panel    Component Value Date/Time   CHOL 204 (H) 09/07/2014 1132   TRIG 64 09/07/2014 1132   HDL 55 09/07/2014 1132   CHOLHDL 3.7 09/07/2014 1132  VLDL 13 09/07/2014 1132   LDLCALC 136 (H) 09/07/2014 1132   LDLDIRECT 127.6 08/18/2012 1011      Wt Readings from Last 3 Encounters:  03/12/16 109 lb 6.4 oz (49.6 kg)  01/25/16 107 lb 3.2 oz (48.6 kg)  12/19/15 108 lb 9.6 oz (49.3 kg)      Other studies Reviewed: Additional studies/ records that were reviewed today include: . Review of the above records demonstrates:    ASSESSMENT AND PLAN:  1. Atrial fibrillation-we have tried her on Rythmol, flecainide, Sotalol, and amiodarone. Her insurance company will not pay for [name].  Her rate is well controlled. Continue current meds.   2. Hypertension - she has been on her BP meds for years.  Will continue   3. Thoracic aortic aneurism [employer]) - s/p stent graft repair. Complicated by a type III endoleak. Repaired with another stenting.  She continues to have some back pain .  Will discuss with Dr. Servando Snare at next apt.    4. Pulmonary Hypertension - Her pulmonary pressures have increased since her previous echo card gram. The pulmonary hypertension is now severe, estimated PA pressure of 77.     Has moderate COPD by PFTs in  March 2015. Has moderate AI  Normal LV systolic functino   We discussed sending her over for a right heart catheterization.  She did not want have a right heart catheter this time. We also discussed starting her on empiric Revatio  but she does not think that we'll be affordable.  will have her see Dr. Haroldine Laws in consultation.   He may have some additional options for her  I will see her back in 6 months   Current medicines are reviewed at length with the patient today.  The patient does not have concerns regarding medicines.  The following changes have been made:  no change   Disposition:   FU with me in 6 months .     Signed, Mertie Moores, MD  03/12/2016 12:00 PM    [address], [place], [place]  [address] Phone: [phone]; Fax: [phone]

## 2016-03-12 NOTE — Patient Instructions (Signed)
Medication Instructions:  Your physician recommends that you continue on your current medications as directed. Please refer to the Current Medication list given to you today.  Labwork: None ordered  Testing/Procedures: None ordered  Follow-Up: You have been referred to Dr. Haroldine Laws for pulmonary hypertension    Your physician wants you to follow-up in 6 months with Dr. Acie Fredrickson. You will receive a reminder letter in the mail two months in advance. If you don't receive a letter, please call our office to schedule an appointment.     If you need a refill on your cardiac medications before your next appointment, please call your pharmacy.

## 2016-03-15 ENCOUNTER — Other Ambulatory Visit: Payer: Self-pay

## 2016-03-28 ENCOUNTER — Ambulatory Visit (INDEPENDENT_AMBULATORY_CARE_PROVIDER_SITE_OTHER): Payer: Medicare Other | Admitting: Pharmacist

## 2016-03-28 DIAGNOSIS — I4891 Unspecified atrial fibrillation: Secondary | ICD-10-CM

## 2016-03-28 DIAGNOSIS — Z7901 Long term (current) use of anticoagulants: Secondary | ICD-10-CM

## 2016-03-28 DIAGNOSIS — Z5181 Encounter for therapeutic drug level monitoring: Secondary | ICD-10-CM | POA: Diagnosis not present

## 2016-03-28 LAB — POCT INR: INR: 2.4

## 2016-04-18 ENCOUNTER — Encounter (HOSPITAL_COMMUNITY): Payer: Self-pay | Admitting: Internal Medicine

## 2016-04-18 ENCOUNTER — Ambulatory Visit (HOSPITAL_COMMUNITY)
Admission: RE | Admit: 2016-04-18 | Discharge: 2016-04-18 | Disposition: A | Discharge status: Home / Self Care | Payer: Medicare Other | Source: Ambulatory Visit | Attending: Internal Medicine | Admitting: Internal Medicine

## 2016-04-18 VITALS — BP 112/66 | HR 72 | Wt 108.2 lb

## 2016-04-18 DIAGNOSIS — I272 Pulmonary hypertension, unspecified: Secondary | ICD-10-CM | POA: Insufficient documentation

## 2016-04-18 DIAGNOSIS — Z79890 Hormone replacement therapy (postmenopausal): Secondary | ICD-10-CM | POA: Diagnosis not present

## 2016-04-18 DIAGNOSIS — J449 Chronic obstructive pulmonary disease, unspecified: Secondary | ICD-10-CM | POA: Diagnosis not present

## 2016-04-18 DIAGNOSIS — I482 Chronic atrial fibrillation, unspecified: Secondary | ICD-10-CM

## 2016-04-18 DIAGNOSIS — Z95828 Presence of other vascular implants and grafts: Secondary | ICD-10-CM | POA: Insufficient documentation

## 2016-04-18 DIAGNOSIS — Z8249 Family history of ischemic heart disease and other diseases of the circulatory system: Secondary | ICD-10-CM | POA: Insufficient documentation

## 2016-04-18 DIAGNOSIS — R296 Repeated falls: Secondary | ICD-10-CM | POA: Insufficient documentation

## 2016-04-18 DIAGNOSIS — I351 Nonrheumatic aortic (valve) insufficiency: Secondary | ICD-10-CM | POA: Diagnosis not present

## 2016-04-18 DIAGNOSIS — I2510 Atherosclerotic heart disease of native coronary artery without angina pectoris: Secondary | ICD-10-CM | POA: Insufficient documentation

## 2016-04-18 DIAGNOSIS — I425 Other restrictive cardiomyopathy: Secondary | ICD-10-CM

## 2016-04-18 DIAGNOSIS — Z87891 Personal history of nicotine dependence: Secondary | ICD-10-CM | POA: Diagnosis not present

## 2016-04-18 DIAGNOSIS — D649 Anemia, unspecified: Secondary | ICD-10-CM | POA: Insufficient documentation

## 2016-04-18 DIAGNOSIS — I340 Nonrheumatic mitral (valve) insufficiency: Secondary | ICD-10-CM | POA: Insufficient documentation

## 2016-04-18 DIAGNOSIS — I509 Heart failure, unspecified: Secondary | ICD-10-CM | POA: Insufficient documentation

## 2016-04-18 DIAGNOSIS — I071 Rheumatic tricuspid insufficiency: Secondary | ICD-10-CM | POA: Diagnosis not present

## 2016-04-18 DIAGNOSIS — F40240 Claustrophobia: Secondary | ICD-10-CM | POA: Diagnosis not present

## 2016-04-18 DIAGNOSIS — I110 Hypertensive heart disease with heart failure: Secondary | ICD-10-CM | POA: Diagnosis not present

## 2016-04-18 DIAGNOSIS — Z807 Family history of other malignant neoplasms of lymphoid, hematopoietic and related tissues: Secondary | ICD-10-CM | POA: Diagnosis not present

## 2016-04-18 DIAGNOSIS — Z7901 Long term (current) use of anticoagulants: Secondary | ICD-10-CM | POA: Insufficient documentation

## 2016-04-18 DIAGNOSIS — K219 Gastro-esophageal reflux disease without esophagitis: Secondary | ICD-10-CM | POA: Insufficient documentation

## 2016-04-18 DIAGNOSIS — I712 Thoracic aortic aneurysm, without rupture: Secondary | ICD-10-CM | POA: Insufficient documentation

## 2016-04-18 DIAGNOSIS — Z809 Family history of malignant neoplasm, unspecified: Secondary | ICD-10-CM | POA: Diagnosis not present

## 2016-04-18 NOTE — Progress Notes (Addendum)
ADVANCED HF CLINIC NOTE   Date:  04/18/2016   ID:  [name] [name], DOB 04/27/1932, MRN [id number]  PCP:  [name], MD  Cardiologist:   Glori Bickers, MD    HPI:  [name] [name] is an 82 y/o woman with chronic atrial fibrillation, HTN thoracic aortic aneurysm s/p endovascular repair x 2 and COPD referred by Dr. Acie Fredrickson for further evaluation of her pulmonary HTN  Denies h/o CAD or connective tissue disease. Has never had a heart cath. Stress test in 4/15 with normal EF and no ischemia.   Echo 3/17 EF 50-55% with restrictive physiology. RV normal. Massive biatrial enlargement. severe TR. Moderate MR RVSP 55 Echo 4/15 EF 55-60% with moderate LVH massive biatrial enlargement RVSP 50 mmHG. Mod MR/TR  Quit smoking 30 years ago. Saw Dr. Melvyn Novas 2 years  go and placed on inhaler without benefit  Lives alone. Says she is always tired. Able to go to the store. Leans on the cart for support but able to shop without too much problem. Has been struggling with ADLs due to her balance and multiple falls. Feels like she has had a serious overall decline. Denies syncope. No edema.    FEV1 1.22 (68%) FVC   1.93 (80%) FEF 25-75 0.69  DLCO 37%     Past Medical History:  Diagnosis Date  . AF (atrial fibrillation) ([place])   . Anemia   . Aortic aneurysm ([place])   . Aortic insufficiency   . Arthritis   . Broken clavicle August 2016  . Broken neck ([place])    40 years ago  . CAD (coronary artery disease)   . CHF (congestive heart failure) ([place])   . Claustrophobia   . COPD (chronic obstructive pulmonary disease) ([place])   . Dysrhythmia   . GERD (gastroesophageal reflux disease)   . Heart murmur   . History of bronchitis   . History of pneumonia   . Hypertension   . Mitral regurgitation   . Shortness of breath   . Tricuspid regurgitation   . Urinary frequency   . Urinary incontinence   . Wears glasses     Past Surgical History:  Procedure Laterality Date  . CARDIAC CATHETERIZATION      . CYSTECTOMY     sacrum  . RADIOLOGY WITH ANESTHESIA N/A 03/16/2015   Procedure: Endoleak Rapair;  Surgeon: Jacqulynn Cadet, MD;  Location: [facility];  Service: Radiology;  Laterality: N/A;  . THORACIC AORTIC ENDOVASCULAR STENT GRAFT N/A 10/02/2012   Procedure: THORACIC AORTIC ENDOVASCULAR STENT GRAFT;  Surgeon: Serafina Mitchell, MD;  Location: [facility];  Service: Vascular;  Laterality: N/A;  Ultrasound guided  . THORACIC AORTIC ENDOVASCULAR STENT GRAFT N/A 10/05/2012   Procedure: Endovascular repair of THORACIC AORTIC ANEURYSM;  Surgeon: Serafina Mitchell, MD;  [facility];  Service: Vascular;  Laterality: N/A;  Angioplasty of Aortic throasic aneurysm, x1 aortagram,  Percutaneous acess right femoral artery.  . TONSILLECTOMY       Current Outpatient Prescriptions  Medication Sig Dispense Refill  . acetaminophen (TYLENOL) 500 MG tablet Take 1,000 mg by mouth every 4 (four) hours as needed for pain. Reported on 09/14/2015    . furosemide (LASIX) 20 MG tablet Take 1 tablet (20 mg total) by mouth daily. 90 tablet 3  . prazosin (MINIPRESS) 2 MG capsule Take 1 capsule (2 mg total) by mouth at bedtime. 30 capsule 11  . propranolol ER (INDERAL LA) 160 MG SR capsule Take 1 capsule (160 mg total) by mouth at  bedtime. 30 capsule 11  . spironolactone (ALDACTONE) 25 MG tablet TAKE ONE HALF TABLET BY MOUTH EVERY MORNING 45 tablet 3  . warfarin (COUMADIN) 5 MG tablet Take as directed by Coumadin clinic. 60 tablet 1   No current facility-administered medications for this encounter.     Allergies:   Atorvastatin; Amiodarone hcl; Diltiazem hcl; Flecainide acetate; Metoprolol succinate; and Sotalol hcl    Social History:  The patient  reports that she quit smoking about 30 years ago. Her smoking use included Cigarettes. She has a 20.00 pack-year smoking history. She has never used smokeless tobacco. She reports that she drinks about 5.4 oz of alcohol per week . She reports that she does not use drugs.   Family  History:  The patient's family history includes Aneurysm (age of onset: 65) in her mother; Cancer in her father and mother; Heart disease in her mother; Hodgkin's lymphoma (age of onset: 65) in her father; Hypertension in her mother.    PHYSICAL EXAM: VS:  BP 112/66   Pulse 72   Wt 108 lb 4 oz (49.1 kg)   SpO2 98%   BMI 19.80 kg/m  , BMI Body mass index is 19.8 kg/m. GEN: Elderly. Thin  in no acute distress  HEENT: normal  Neck: JVP 7-8 prominent CV waves, carotid bruits, or masses Cardiac: irreg. Irreg. ; 2/6 TR/MR  Prominent s2,no edema  Respiratory:  clear to auscultation bilaterally with decreased BS throughout GI: soft, nontender, nondistended, + BS MS: no deformity or atrophy  Skin: warm and dry, no rash Neuro:  Strength and sensation are intact Psych: normal  Recent Labs: 10/20/2015: BUN 27; Creat 1.09    Lipid Panel    Component Value Date/Time   CHOL 204 (H) 09/07/2014 1132   TRIG 64 09/07/2014 1132   HDL 55 09/07/2014 1132   CHOLHDL 3.7 09/07/2014 1132   VLDL 13 09/07/2014 1132   LDLCALC 136 (H) 09/07/2014 1132   LDLDIRECT 127.6 08/18/2012 1011      Wt Readings from Last 3 Encounters:  04/18/16 108 lb 4 oz (49.1 kg)  03/12/16 109 lb 6.4 oz (49.6 kg)  01/25/16 107 lb 3.2 oz (48.6 kg)     ASSESSMENT AND PLAN:  1. Pulmonary Hypertension - I have reviewed her echo personally. She has a restrictive CM with moderate to severe PH(Who Group II). She also has moderate COPD but doubt this is the major diver of her [place]. We discussed the pathophysiology of restrictive CM in detail. Unfortunately, we have very little to offer her except very close attention to volume management. We did discuss possible RHC to further define the severity of her PH and also to better assess her volume status. She is not overly interested in this but will get back to [place]. No role for selective pulmonary vasodilators.   2. Chronic Atrial fibrillation with massive biatrial enlargement -she has  failed Rythmol, flecainide, Sotalol, and amiodarone. Her insurance company will not pay for [name] but given her atrial size this would likely be futile as we.. Her rate is well controlled. Continue current meds.   3. Hypertension - Well conttolled  4. Thoracic aortic aneurism [employer]) - s/p stent graft repair. Complicated by a type III endoleak. Repaired with another stenting in 2014.    Total time spent 45 minutes. Over half that time spent discussing above.   Jerelene Salaam,MD 10:08 PM

## 2016-04-18 NOTE — Patient Instructions (Signed)
Please give [place] a call back if you would like to proceed with a Right Heart Catheterization  Follow up with [place] as needed

## 2016-04-25 ENCOUNTER — Telehealth (HOSPITAL_COMMUNITY): Payer: Self-pay | Admitting: *Deleted

## 2016-04-25 NOTE — Telephone Encounter (Signed)
Pt called to let us know she does not want to proceed w/RHC at this time, she may want to in the future and will call us back if so.

## 2016-04-26 DIAGNOSIS — Z85828 Personal history of other malignant neoplasm of skin: Secondary | ICD-10-CM | POA: Diagnosis not present

## 2016-04-26 DIAGNOSIS — L853 Xerosis cutis: Secondary | ICD-10-CM | POA: Diagnosis not present

## 2016-04-26 DIAGNOSIS — L821 Other seborrheic keratosis: Secondary | ICD-10-CM | POA: Diagnosis not present

## 2016-05-09 ENCOUNTER — Ambulatory Visit (INDEPENDENT_AMBULATORY_CARE_PROVIDER_SITE_OTHER): Payer: Medicare Other | Admitting: Pharmacist

## 2016-05-09 DIAGNOSIS — Z5181 Encounter for therapeutic drug level monitoring: Secondary | ICD-10-CM

## 2016-05-09 DIAGNOSIS — I4891 Unspecified atrial fibrillation: Secondary | ICD-10-CM

## 2016-05-09 DIAGNOSIS — Z7901 Long term (current) use of anticoagulants: Secondary | ICD-10-CM

## 2016-05-09 LAB — POCT INR: INR: 2.1

## 2016-05-23 DIAGNOSIS — Z23 Encounter for immunization: Secondary | ICD-10-CM | POA: Diagnosis not present

## 2016-06-05 ENCOUNTER — Telehealth: Payer: Self-pay | Admitting: Internal Medicine

## 2016-06-05 NOTE — Telephone Encounter (Signed)
Tried to call patient   Info wanted to find out:  1- has she fallen any since her last visit with [place] 2- how many times has she fallen 3- what was the fall from

## 2016-06-20 ENCOUNTER — Ambulatory Visit (INDEPENDENT_AMBULATORY_CARE_PROVIDER_SITE_OTHER): Payer: Medicare Other | Admitting: *Deleted

## 2016-06-20 DIAGNOSIS — I4891 Unspecified atrial fibrillation: Secondary | ICD-10-CM

## 2016-06-20 DIAGNOSIS — Z7901 Long term (current) use of anticoagulants: Secondary | ICD-10-CM | POA: Diagnosis not present

## 2016-06-20 DIAGNOSIS — Z5181 Encounter for therapeutic drug level monitoring: Secondary | ICD-10-CM | POA: Diagnosis not present

## 2016-06-20 LAB — POCT INR: INR: 2.4

## 2016-07-30 ENCOUNTER — Ambulatory Visit (INDEPENDENT_AMBULATORY_CARE_PROVIDER_SITE_OTHER): Payer: Medicare Other

## 2016-07-30 DIAGNOSIS — I4891 Unspecified atrial fibrillation: Secondary | ICD-10-CM

## 2016-07-30 DIAGNOSIS — Z5181 Encounter for therapeutic drug level monitoring: Secondary | ICD-10-CM | POA: Diagnosis not present

## 2016-07-30 DIAGNOSIS — Z7901 Long term (current) use of anticoagulants: Secondary | ICD-10-CM

## 2016-07-30 LAB — POCT INR: INR: 2.3

## 2016-07-31 ENCOUNTER — Other Ambulatory Visit: Payer: Self-pay | Admitting: *Deleted

## 2016-07-31 DIAGNOSIS — I10 Essential (primary) hypertension: Secondary | ICD-10-CM

## 2016-07-31 DIAGNOSIS — I482 Chronic atrial fibrillation, unspecified: Secondary | ICD-10-CM

## 2016-07-31 MED ORDER — FUROSEMIDE 20 MG PO TABS: 20.0000 mg | ORAL_TABLET | Freq: Every day | ORAL | 1 refills | Status: DC

## 2016-07-31 MED ORDER — WARFARIN SODIUM 5 MG PO TABS: ORAL_TABLET | ORAL | 1 refills | Status: DC

## 2016-07-31 MED ORDER — SPIRONOLACTONE 25 MG PO TABS: ORAL_TABLET | ORAL | 1 refills | Status: DC

## 2016-08-17 ENCOUNTER — Telehealth: Payer: Self-pay | Admitting: *Deleted

## 2016-08-17 NOTE — Telephone Encounter (Signed)
Pt called & stated she will be starting Chlorahexadine Oral wash & she stated she may also be starting Ampicillin.  She stated she will call back if she starts Ampicillin along with the dose, etc.

## 2016-09-13 ENCOUNTER — Ambulatory Visit (INDEPENDENT_AMBULATORY_CARE_PROVIDER_SITE_OTHER): Payer: Medicare Other | Admitting: *Deleted

## 2016-09-13 ENCOUNTER — Ambulatory Visit (INDEPENDENT_AMBULATORY_CARE_PROVIDER_SITE_OTHER): Payer: Medicare Other | Admitting: Cardiovascular Disease

## 2016-09-13 ENCOUNTER — Encounter: Payer: Self-pay | Admitting: Cardiovascular Disease

## 2016-09-13 DIAGNOSIS — Z7901 Long term (current) use of anticoagulants: Secondary | ICD-10-CM | POA: Diagnosis not present

## 2016-09-13 DIAGNOSIS — I482 Chronic atrial fibrillation, unspecified: Secondary | ICD-10-CM

## 2016-09-13 DIAGNOSIS — I4891 Unspecified atrial fibrillation: Secondary | ICD-10-CM

## 2016-09-13 DIAGNOSIS — I10 Essential (primary) hypertension: Secondary | ICD-10-CM

## 2016-09-13 DIAGNOSIS — Z5181 Encounter for therapeutic drug level monitoring: Secondary | ICD-10-CM

## 2016-09-13 LAB — POCT INR: INR: 2.2

## 2016-09-13 MED ORDER — PROPRANOLOL HCL ER 160 MG PO CP24: 160.0000 mg | ORAL_CAPSULE | Freq: Every day | ORAL | 3 refills | Status: AC

## 2016-09-13 MED ORDER — PRAZOSIN HCL 2 MG PO CAPS: 2.0000 mg | ORAL_CAPSULE | Freq: Every day | ORAL | 3 refills | Status: AC

## 2016-09-13 MED ORDER — FUROSEMIDE 20 MG PO TABS: 20.0000 mg | ORAL_TABLET | Freq: Every day | ORAL | 3 refills | Status: AC

## 2016-09-13 MED ORDER — SPIRONOLACTONE 25 MG PO TABS: ORAL_TABLET | ORAL | 3 refills | Status: DC

## 2016-09-13 NOTE — Patient Instructions (Signed)
Medication Instructions:  Your physician recommends that you continue on your current medications as directed. Please refer to the Current Medication list given to you today.   Labwork: TODAY - basic metabolic panel   Testing/Procedures: None Ordered   Follow-Up: Your physician wants you to follow-up in: 6 months with Dr. Nahser. You will receive a reminder letter in the mail two months in advance. If you don't receive a letter, please call our office to schedule the follow-up appointment.   If you need a refill on your cardiac medications before your next appointment, please call your pharmacy.   Thank you for choosing [facility]! Michelle Swinyer, RN [phone]    

## 2016-09-13 NOTE — Progress Notes (Addendum)
Cardiology Office Note   Date:  09/13/2016   ID:  [name] [name], DOB 03-18-1932, MRN [id number]  PCP:  [name], MD  Cardiologist:   Mertie Moores, MD   No chief complaint on file.  Problem List:  1. Atrial fibrillation-we have tried her on Rythmol, flecainide, Sotalol, and amiodarone. Her insurance company will not pay for [name]. 2. Hypertension 3. Thoracic aortic aneurism Servando Snare) - s/p stent graft repair. Complicated by a type III endoleak. Repaired with another stenting.  4. Pulmonary Hypertension - moderate , est. PA pressures of 55.   August 25, 2014   [name] [name] is a 83 y.o. female who presents for her atrial fib.  Same symptoms.  Still short of breath.  Has lots of fatigue with any exercise. She continues to have Afib.  Has tried multiple meds.  Has had lots of back pain - since her thoracic aortic surgery .  She gets very fatigued with any sort of walking or exercise  February 23, 2015:  Still having lots of back pain - has lost her balance.  Is short of breath also  Has fallen on several occasions  Needs to have vascular surgery later this month.   Her stent graft has an endoleak .   Feb. 8, 2017  Doing ok Still has DOE - possibly a bit worse than before  Still in atrial fib   Has had an aortic aneurism stent graft repair.  Had an endoleak, with repair.     Aug. 28 , 2017: Doing about the same.  Breathing is still short - slightly worse Has had balance issues.  Has not seen neurology   September 13, 2016:  Doing well Reading [name] [name] [facility] [employer] [place] -  Cardiac situation is stable .  Gets winded with any exertion . Has pulmonary HTN . Having balance issues.   Past Medical History:  Diagnosis Date  . AF (atrial fibrillation) ([place])   . Anemia   . Aortic aneurysm ([place])   . Aortic insufficiency   . Arthritis   . Broken clavicle August 2016  . Broken neck ([place])    40 years ago  . CAD (coronary artery disease)   . CHF  (congestive heart failure) ([place])   . Claustrophobia   . COPD (chronic obstructive pulmonary disease) ([place])   . Dysrhythmia   . GERD (gastroesophageal reflux disease)   . Heart murmur   . History of bronchitis   . History of pneumonia   . Hypertension   . Mitral regurgitation   . Shortness of breath   . Tricuspid regurgitation   . Urinary frequency   . Urinary incontinence   . Wears glasses     Past Surgical History:  Procedure Laterality Date  . CARDIAC CATHETERIZATION    . CYSTECTOMY     sacrum  . RADIOLOGY WITH ANESTHESIA N/A 03/16/2015   Procedure: Endoleak Rapair;  Surgeon: Jacqulynn Cadet, MD;  Location: [facility];  Service: Radiology;  Laterality: N/A;  . THORACIC AORTIC ENDOVASCULAR STENT GRAFT N/A 10/02/2012   Procedure: THORACIC AORTIC ENDOVASCULAR STENT GRAFT;  Surgeon: [name] Mitchell, MD;  Location: [facility];  Service: Vascular;  Laterality: N/A;  Ultrasound guided  . THORACIC AORTIC ENDOVASCULAR STENT GRAFT N/A 10/05/2012   Procedure: Endovascular repair of THORACIC AORTIC ANEURYSM;  Surgeon: [name] Mitchell, MD;  [facility];  Service: Vascular;  Laterality: N/A;  Angioplasty of Aortic throasic aneurysm, x1 aortagram,  Percutaneous acess right femoral artery.  [name] [name]  TONSILLECTOMY       Current Outpatient Prescriptions  Medication Sig Dispense Refill  . acetaminophen (TYLENOL) 500 MG tablet Take 1,000 mg by mouth every 4 (four) hours as needed for pain. Reported on 09/14/2015    . furosemide (LASIX) 20 MG tablet Take 1 tablet (20 mg total) by mouth daily. 90 tablet 1  . prazosin (MINIPRESS) 2 MG capsule Take 1 capsule (2 mg total) by mouth at bedtime. 30 capsule 11  . propranolol ER (INDERAL LA) 160 MG SR capsule Take 1 capsule (160 mg total) by mouth at bedtime. 30 capsule 11  . spironolactone (ALDACTONE) 25 MG tablet TAKE ONE HALF TABLET BY MOUTH EVERY MORNING 45 tablet 1  . warfarin (COUMADIN) 5 MG tablet Take as directed by Coumadin clinic. 60 tablet 1   No current  facility-administered medications for this visit.     Allergies:   Atorvastatin; Amiodarone hcl; Diltiazem hcl; Flecainide acetate; Metoprolol succinate; and Sotalol hcl    Social History:  The patient  reports that she quit smoking about 31 years ago. Her smoking use included Cigarettes. She has a 20.00 pack-year smoking history. She has never used smokeless tobacco. She reports that she drinks about 5.4 oz of alcohol per week . She reports that she does not use drugs.   Family History:  The patient's family history includes Aneurysm (age of onset: 42) in her mother; Cancer in her father and mother; Heart disease in her mother; Hodgkin's lymphoma (age of onset: 5) in her father; Hypertension in her mother.    ROS:  Please see the history of present illness.    Review of Systems: Constitutional:  denies fever, chills, diaphoresis, appetite change and fatigue.  HEENT: denies photophobia, eye pain, redness, hearing loss, ear pain, congestion, sore throat, rhinorrhea, sneezing, neck pain, neck stiffness and tinnitus.  Respiratory: denies SOB, DOE, cough, chest tightness, and wheezing.  Cardiovascular: denies chest pain, palpitations and leg swelling.  Gastrointestinal: denies nausea, vomiting, abdominal pain, diarrhea, constipation, blood in stool.  Genitourinary: denies dysuria, urgency, frequency, hematuria, flank pain and difficulty urinating.  Musculoskeletal: denies  myalgias, back pain, joint swelling, arthralgias and gait problem.   Skin: denies pallor, rash and wound.  Neurological: denies dizziness, seizures, syncope, weakness, light-headedness, numbness and headaches.   Hematological: denies adenopathy, easy bruising, personal or family bleeding history.  Psychiatric/ Behavioral: denies suicidal ideation, mood changes, confusion, nervousness, sleep disturbance and agitation.       All other systems are reviewed and negative.    PHYSICAL EXAM: VS:  BP (!) 128/92 (BP Location:  Left Arm, Patient Position: Sitting, Cuff Size: Normal)   Pulse 84   Ht 5\' 2"  (1.575 m)   Wt 109 lb (49.4 kg)   SpO2 95%   BMI 19.94 kg/m  , BMI Body mass index is 19.94 kg/m. GEN: Well nourished, well developed, in no acute distress  HEENT: normal  Neck: no JVD, carotid bruits, or masses Cardiac: irreg. Irreg. ; soft systolic murmur ,  2/6 diastolic murmur ,     ,no edema  Respiratory:  clear to auscultation bilaterally, normal work of breathing GI: soft, nontender, nondistended, + BS MS: no deformity or atrophy  Skin: warm and dry, no rash Neuro:  Strength and sensation are intact Psych: normal   EKG:  EKG is ordered today. The ekg ordered today demonstrates atrial fib with rate of 84    Recent Labs: 10/20/2015: BUN 27; Creat 1.09    Lipid Panel    Component  Value Date/Time   CHOL 204 (H) 09/07/2014 1132   TRIG 64 09/07/2014 1132   HDL 55 09/07/2014 1132   CHOLHDL 3.7 09/07/2014 1132   VLDL 13 09/07/2014 1132   LDLCALC 136 (H) 09/07/2014 1132   LDLDIRECT 127.6 08/18/2012 1011      Wt Readings from Last 3 Encounters:  09/13/16 109 lb (49.4 kg)  04/18/16 108 lb 4 oz (49.1 kg)  03/12/16 109 lb 6.4 oz (49.6 kg)      Other studies Reviewed: Additional studies/ records that were reviewed today include: . Review of the above records demonstrates:   ECG :  Personally reviewed  September 13, 2016:   Atrial fib with occasionally aberrantly conducted beats. , NS ST abn.   ASSESSMENT AND PLAN:  1. Atrial fibrillation-we have tried her on Rythmol, flecainide, Sotalol, and amiodarone. Her insurance company will not pay for [name].  Her rate is well controlled. Continue current meds.   2. Hypertension - she has been on her BP meds for years.  Will continue   3. Thoracic aortic aneurism Servando Snare) - s/p stent graft repair. Complicated by a type III endoleak. Repaired with another stenting.  She continues to have some back pain .  Will discuss with Dr. Servando Snare at next  apt.    4. Pulmonary Hypertension - Her pulmonary pressures have increased since her previous echocardiogram. The pulmonary hypertension is now severe, estimated PA pressure of 77.     She has been having some issues with urinary urgency after taking the lasix.   We discussed changing the time of when she takes the lasix so that she is able to run errands.   Has moderate COPD by PFTs in March 2015. Has moderate AI  Normal LV systolic function  She has been seen by the Pulmonary HTN clinic. Her p-HTN appears to be fixed and there really is no role for vasodilators. Will continue volume management.   I will see her back in 6 months   Current medicines are reviewed at length with the patient today.  The patient does not have concerns regarding medicines.  The following changes have been made:  no change   Disposition:   FU with me in 6 months .     Signed, Mertie Moores, MD  09/13/2016 2:53 PM    [facility] [address], [place], [place]  [address] Phone: [phone]; Fax: [phone]

## 2016-09-14 LAB — BASIC METABOLIC PANEL
BUN/Creatinine Ratio: 28 (ref 12–28)
BUN: 37 mg/dL — ABNORMAL HIGH (ref 8–27)
CO2: 24 mmol/L (ref 18–29)
CREATININE: 1.33 mg/dL — AB (ref 0.57–1.00)
Calcium: 10.1 mg/dL (ref 8.7–10.3)
Chloride: 98 mmol/L (ref 96–106)
GFR, EST AFRICAN AMERICAN: 43 mL/min/{1.73_m2} — AB (ref >59)
GFR, EST NON AFRICAN AMERICAN: 37 mL/min/{1.73_m2} — AB (ref >59)
Glucose: 103 mg/dL — ABNORMAL HIGH (ref 65–99)
Potassium: 5 mmol/L (ref 3.5–5.2)
SODIUM: 140 mmol/L (ref 134–144)

## 2016-10-22 ENCOUNTER — Other Ambulatory Visit (HOSPITAL_COMMUNITY): Payer: Self-pay | Admitting: Interventional Radiology

## 2016-10-22 DIAGNOSIS — IMO0001 Reserved for inherently not codable concepts without codable children: Secondary | ICD-10-CM

## 2016-10-22 DIAGNOSIS — T82330D Leakage of aortic (bifurcation) graft (replacement), subsequent encounter: Secondary | ICD-10-CM

## 2016-10-23 ENCOUNTER — Other Ambulatory Visit: Payer: Self-pay | Admitting: *Deleted

## 2016-10-23 DIAGNOSIS — T82330A Leakage of aortic (bifurcation) graft (replacement), initial encounter: Secondary | ICD-10-CM

## 2016-10-23 DIAGNOSIS — IMO0002 Reserved for concepts with insufficient information to code with codable children: Secondary | ICD-10-CM

## 2016-10-24 ENCOUNTER — Ambulatory Visit (INDEPENDENT_AMBULATORY_CARE_PROVIDER_SITE_OTHER): Payer: Medicare Other | Admitting: *Deleted

## 2016-10-24 DIAGNOSIS — Z5181 Encounter for therapeutic drug level monitoring: Secondary | ICD-10-CM

## 2016-10-24 DIAGNOSIS — I4891 Unspecified atrial fibrillation: Secondary | ICD-10-CM | POA: Diagnosis not present

## 2016-10-24 LAB — POCT INR: INR: 2.2

## 2016-10-24 MED ORDER — WARFARIN SODIUM 5 MG PO TABS: ORAL_TABLET | ORAL | 1 refills | Status: AC

## 2016-11-14 ENCOUNTER — Ambulatory Visit (INDEPENDENT_AMBULATORY_CARE_PROVIDER_SITE_OTHER): Payer: Medicare Other | Admitting: Family Medicine

## 2016-11-14 ENCOUNTER — Ambulatory Visit
Admission: RE | Admit: 2016-11-14 | Discharge: 2016-11-14 | Disposition: A | Discharge status: Home / Self Care | Payer: Medicare Other | Source: Ambulatory Visit | Attending: Family Medicine | Admitting: Family Medicine

## 2016-11-14 ENCOUNTER — Encounter: Payer: Self-pay | Admitting: Family Medicine

## 2016-11-14 VITALS — BP 120/70 | HR 68 | Temp 97.6°F | Wt 108.2 lb

## 2016-11-14 DIAGNOSIS — W19XXXA Unspecified fall, initial encounter: Secondary | ICD-10-CM

## 2016-11-14 DIAGNOSIS — M533 Sacrococcygeal disorders, not elsewhere classified: Secondary | ICD-10-CM | POA: Diagnosis not present

## 2016-11-14 DIAGNOSIS — M25551 Pain in right hip: Secondary | ICD-10-CM

## 2016-11-14 DIAGNOSIS — M25552 Pain in left hip: Secondary | ICD-10-CM | POA: Diagnosis not present

## 2016-11-14 NOTE — Progress Notes (Signed)
Subjective:    Patient ID: [name] [name], female    DOB: [birth year], 83 y.o.   MRN: [id number]  HPI Chief Complaint  Patient presents with  . fell at home    fell at home and landed on behind.    She is here with complaints of tripping and falling. States the fall occurred at 8 pm last night at her home. She fell in her dining room on hardwood floors. States she tripped over a rug in her dining room and fell backwards onto her buttocks. States she was able to get up quickly on her own.  Denies loc, states she cannot say for sure that she did not hit her head but she does not think she did. States she had a headache last night but not today.   She complains of pain to her buttocks and right posterior hip. Pain is worse with sitting and ambulating.  Denies fever, chills, headache, dizziness, blurred or double vision, confusion, neck pain, chest pain, shortness of breath, abdominal pain, N/V/D.  Denies numbness, tingling or weakness.   States her balance is not good. She ambulates with a cane. States she has not fallen in 2-3 years.   She is taking warfarin. States she found a scratch on the back of her left upper arm that bled briefly. No other bleeding per patient.  Tdap is up to date.    Past Medical History:  Diagnosis Date  . AF (atrial fibrillation) ([place])   . Anemia   . Aortic aneurysm ([place])   . Aortic insufficiency   . Arthritis   . Broken clavicle August 2016  . Broken neck ([place])    40 years ago  . CAD (coronary artery disease)   . CHF (congestive heart failure) ([place])   . Claustrophobia   . COPD (chronic obstructive pulmonary disease) ([place])   . Dysrhythmia   . GERD (gastroesophageal reflux disease)   . Heart murmur   . History of bronchitis   . History of pneumonia   . Hypertension   . Mitral regurgitation   . Shortness of breath   . Tricuspid regurgitation   . Urinary frequency   . Urinary incontinence   . Wears glasses    Past Surgical History:  Procedure  Laterality Date  . CARDIAC CATHETERIZATION    . CYSTECTOMY     sacrum  . RADIOLOGY WITH ANESTHESIA N/A 03/16/2015   Procedure: Endoleak Rapair;  Surgeon: Jacqulynn Cadet, MD;  Location: [facility];  Service: Radiology;  Laterality: N/A;  . THORACIC AORTIC ENDOVASCULAR STENT GRAFT N/A 10/02/2012   Procedure: THORACIC AORTIC ENDOVASCULAR STENT GRAFT;  Surgeon: Serafina Mitchell, MD;  Location: [facility];  Service: Vascular;  Laterality: N/A;  Ultrasound guided  . THORACIC AORTIC ENDOVASCULAR STENT GRAFT N/A 10/05/2012   Procedure: Endovascular repair of THORACIC AORTIC ANEURYSM;  Surgeon: Serafina Mitchell, MD;  [facility];  Service: Vascular;  Laterality: N/A;  Angioplasty of Aortic throasic aneurysm, x1 aortagram,  Percutaneous acess right femoral artery.  . TONSILLECTOMY       Review of Systems Pertinent positives and negatives in the history of present illness.     Objective:   Physical Exam  Constitutional: She is oriented to person, place, and time. She appears well-developed and well-nourished. No distress.  HENT:  Head: Normocephalic and atraumatic.  Right Ear: Tympanic membrane and ear canal normal.  Left Ear: Tympanic membrane and ear canal normal.  Nose: Nose normal.  Mouth/Throat: Uvula is midline, oropharynx  is clear and moist and mucous membranes are normal.  Eyes: Conjunctivae, EOM and lids are normal. Pupils are equal, round, and reactive to light.  Neck: Trachea normal, normal range of motion and full passive range of motion without pain. Neck supple. No spinous process tenderness and no muscular tenderness present.  Cardiovascular: Normal pulses.   Pulmonary/Chest: Effort normal and breath sounds normal.  Abdominal: Soft. Normal appearance. There is no tenderness. There is no CVA tenderness.  Musculoskeletal:       Right hip: She exhibits tenderness. She exhibits normal range of motion.       Left hip: Normal.       Cervical back: Normal.       Thoracic back: Normal.        Lumbar back: Normal.  Sacral tenderness.  Right sacroiliac tenderness.    Neurological: She is alert and oriented to person, place, and time. She has normal strength and normal reflexes. No cranial nerve deficit or sensory deficit. Gait abnormal. Coordination normal.  Skin: Skin is warm and dry. Abrasion noted. No rash noted. No pallor.     Abrasion, superficial, to her left posterior arm. Scab noted. No sign of infection   Psychiatric: She has a normal mood and affect. Her speech is normal and behavior is normal. Thought content normal.   BP 120/70   Pulse 68   Temp 97.6 F (36.4 C) (Oral)   Wt 108 lb 3.2 oz (49.1 kg)   BMI 19.79 kg/m       Assessment & Plan:  Fall, initial encounter - Plan: DG HIPS BILAT WITH PELVIS 3-4 VIEWS, DG Sacrum/Coccyx  Acute right hip pain - Plan: DG HIPS BILAT WITH PELVIS 3-4 VIEWS  Plan to send her for an XR to rule out fracture.  Advised that if she develops headache, vomiting or confusion then she should seek immediate medical care. No obvious trauma to her head, no sign of bleeding.  Discussed taking Tylenol for pain and using ice or heat.  Will follow up pending XR result.

## 2016-11-20 ENCOUNTER — Ambulatory Visit (HOSPITAL_COMMUNITY)
Admission: RE | Admit: 2016-11-20 | Discharge: 2016-11-20 | Disposition: A | Discharge status: Home / Self Care | Payer: Medicare Other | Source: Ambulatory Visit | Attending: Interventional Radiology | Admitting: Interventional Radiology

## 2016-11-20 ENCOUNTER — Encounter (HOSPITAL_COMMUNITY): Payer: Self-pay

## 2016-11-20 ENCOUNTER — Ambulatory Visit
Admission: RE | Admit: 2016-11-20 | Discharge: 2016-11-20 | Disposition: A | Discharge status: Home / Self Care | Payer: Medicare Other | Source: Ambulatory Visit | Attending: Interventional Radiology | Admitting: Interventional Radiology

## 2016-11-20 DIAGNOSIS — X58XXXD Exposure to other specified factors, subsequent encounter: Secondary | ICD-10-CM | POA: Insufficient documentation

## 2016-11-20 DIAGNOSIS — I712 Thoracic aortic aneurysm, without rupture: Secondary | ICD-10-CM | POA: Diagnosis not present

## 2016-11-20 DIAGNOSIS — IMO0001 Reserved for inherently not codable concepts without codable children: Secondary | ICD-10-CM

## 2016-11-20 DIAGNOSIS — T82330D Leakage of aortic (bifurcation) graft (replacement), subsequent encounter: Secondary | ICD-10-CM | POA: Insufficient documentation

## 2016-11-20 DIAGNOSIS — I517 Cardiomegaly: Secondary | ICD-10-CM | POA: Diagnosis not present

## 2016-11-20 HISTORY — PX: IR RADIOLOGIST EVAL & MGMT: IMG5224

## 2016-11-20 LAB — POCT I-STAT CREATININE: Creatinine, Ser: 1.3 mg/dL — ABNORMAL HIGH (ref 0.44–1.00)

## 2016-11-20 MED ORDER — IOPAMIDOL (ISOVUE-370) INJECTION 76%
INTRAVENOUS | Status: AC
  Administered 2016-11-20: 80 mL via INTRAVENOUS
  Filled 2016-11-20: qty 100

## 2016-11-20 NOTE — Progress Notes (Signed)
Chief Complaint: Patient was seen in follow-up today for  Chief Complaint  Patient presents with  . Follow-up    follow up Endoleak   at the request of McCullough,Heath  Referring Physician(s): McCullough,Heath  History of Present Illness: [name] [name] is a 83 y.o. female with a large descending thoracic aneurysm previously repaired by TEVAR. She had an endoleak and definite growth of the sac over time. She underwent a direct sac puncture repair on 03/16/15. At that time, angiography revealed what we expected to be a Type 2 endoleak was actually a Type 1b. Thanks to fortuitous bottle-necking of the endoleak entry site into the sac, we were able to place a coil and Onyx liquid embolic plug into this bottle neck which we felt repaired the endoleak.   She is doing very well today and is essentially clinically asymptomatic.  She denies, CP, SOB, abd pain, N/V. She continues to see Dr. Redmond School, her PCP, and Dr. Acie Fredrickson, her Cardiologist. Her Coumadin is managed at the outpt [facility].  CTA imaging demonstrates no evidence of residual endoleak and a stable to slightly decreased excluded aneurysm sac.  Past Medical History:  Diagnosis Date  . AF (atrial fibrillation) ([place])   . Anemia   . Aortic aneurysm ([place])   . Aortic insufficiency   . Arthritis   . Broken clavicle August 2016  . Broken neck ([place])    40 years ago  . CAD (coronary artery disease)   . CHF (congestive heart failure) ([place])   . Claustrophobia   . COPD (chronic obstructive pulmonary disease) ([place])   . Dysrhythmia   . GERD (gastroesophageal reflux disease)   . Heart murmur   . History of bronchitis   . History of pneumonia   . Hypertension   . Mitral regurgitation   . Shortness of breath   . Tricuspid regurgitation   . Urinary frequency   . Urinary incontinence   . Wears glasses     Past Surgical History:  Procedure Laterality Date  . CARDIAC CATHETERIZATION    . CYSTECTOMY     sacrum  .  RADIOLOGY WITH ANESTHESIA N/A 03/16/2015   Procedure: Endoleak [place];  Surgeon: Jacqulynn Cadet, MD;  Location: [facility];  Service: Radiology;  Laterality: N/A;  . THORACIC AORTIC ENDOVASCULAR STENT GRAFT N/A 10/02/2012   Procedure: THORACIC AORTIC ENDOVASCULAR STENT GRAFT;  Surgeon: Serafina Mitchell, MD;  Location: [facility];  Service: Vascular;  Laterality: N/A;  Ultrasound guided  . THORACIC AORTIC ENDOVASCULAR STENT GRAFT N/A 10/05/2012   Procedure: Endovascular repair of THORACIC AORTIC ANEURYSM;  Surgeon: Serafina Mitchell, MD;  Location: [facility];  Service: Vascular;  Laterality: N/A;  Angioplasty of Aortic throasic aneurysm, x1 aortagram,  Percutaneous acess right femoral artery.  . TONSILLECTOMY      Allergies: Atorvastatin; Amiodarone hcl; Diltiazem hcl; Flecainide acetate; Metoprolol succinate; and Sotalol hcl  Medications: Prior to Admission medications   Medication Sig Start Date End Date Taking? Authorizing Provider  acetaminophen (TYLENOL) 500 MG tablet Take 1,000 mg by mouth every 4 (four) hours as needed for pain. Reported on 09/14/2015   Yes Historical Provider, MD  furosemide (LASIX) 20 MG tablet Take 1 tablet (20 mg total) by mouth daily. 08/24/15 10/08/16 Yes Thayer Headings, MD  prazosin (MINIPRESS) 2 MG capsule Take 1 capsule (2 mg total) by mouth at bedtime. 08/24/15  Yes Thayer Headings, MD  propranolol ER (INDERAL LA) 160 MG SR capsule Take 1 capsule (160 mg total) by  mouth at bedtime. 08/24/15  Yes Thayer Headings, MD  spironolactone (ALDACTONE) 25 MG tablet TAKE ONE HALF TABLET BY MOUTH EVERY MORNING 08/24/15  Yes Thayer Headings, MD  triamcinolone (KENALOG) 0.1 % paste Use as directed 1 application in the mouth or throat 2 (two) times daily. Patient not taking: Reported on 10/27/2015 09/14/15   Rita Ohara, MD  warfarin (COUMADIN) 5 MG tablet Take as directed by Coumadin clinic. 10/17/15   Thayer Headings, MD     Family History  Problem Relation Age of Onset  . Aneurysm Mother 65  . Cancer  Mother     breast  . Heart disease Mother   . Hypertension Mother   . Hodgkin's lymphoma Father 55  . Cancer Father     hodgkins    Social History   Social History  . Marital status: Widowed    Spouse name: N/A  . Number of children: 3  . Years of education: N/A   Occupational History  . Architect Retired   Social History Main Topics  . Smoking status: Former Smoker    Packs/day: 1.00    Years: 20.00    Types: Cigarettes    Quit date: 08/22/1985  . Smokeless tobacco: Never Used  . Alcohol use 5.4 oz/week    9 Glasses of wine per week     Comment: a glass of wine every night  . Drug use: No  . Sexual activity: No   Other Topics Concern  . Not on file   Social History Narrative  . No narrative on file   Review of Systems: A 12 point ROS discussed and pertinent positives are indicated in the HPI above.  All other systems are negative.  Review of Systems  Vital Signs: BP (!) 121/56   Pulse 71   Temp 98 F (36.7 C) (Oral)   Resp 14   Ht 5\' 2"  (1.575 m)   Wt 106 lb (48.1 kg)   SpO2 97%   BMI 19.39 kg/m   Physical Exam  Constitutional: She is oriented to person, place, and time. She appears well-developed and well-nourished.  HENT:  Head: Normocephalic and atraumatic.  Eyes: No scleral icterus.  Cardiovascular: Normal rate.   Pulmonary/Chest: Effort normal and breath sounds normal.  Abdominal: Soft. She exhibits no distension. There is no tenderness.  Neurological: She is alert and oriented to person, place, and time.  Skin: Skin is warm and dry.  Psychiatric: She has a normal mood and affect. Her behavior is normal.  Nursing note and vitals reviewed.   Imaging:   Ct Angio Chest Aorta W/cm &/or Wo/cm  Result Date: 11/20/2016 CLINICAL DATA:  83 year old with thoracic aortic aneurysm and status post stent graft placement. Patient has been treated for a type 1B leak and presents for follow-up evaluation. EXAM: CT ANGIOGRAPHY CHEST WITH CONTRAST  TECHNIQUE: Multidetector CT imaging of the chest was performed using the standard protocol during bolus administration of intravenous contrast. Multiplanar CT image reconstructions and MIPs were obtained to evaluate the vascular anatomy. CONTRAST:  80 mL Isovue 370 COMPARISON:  10/27/2015 FINDINGS: Cardiovascular: Aortic stent graft involving the descending thoracic aorta. Again noted are embolization coils along the anterior aspect of the aneurysm sac in the lower chest. The thoracic aorta and stent graft are patent. The ascending thoracic aorta is mildly aneurysmal measuring up to 4.0 cm and stable. Negative for an aortic dissection. Great vessels are patent. Pulmonary arteries are patent without filling defects. The aneurysm sac involving the  descending thoracic aorta measures 7.7 x 6.2 cm and previously measured 7.4 x 6.4 cm. No clear evidence for an endoleak. Large amount of contrast refluxing into the hepatic veins suggesting increased right heart pressures. Again noted is marked enlargement of the right atrium. Left atrium is also enlarged measuring 6.6 cm in the AP dimension. Proximal abdominal aorta near the hiatus measures 4.1 cm in AP dimension and this is stable. Coronary arteries are heavily calcified. There was concern for a penetrating ulcer near the distal aspect of the aortic stent on the previous examination. Vascular detail at the aortic hiatus and distal aspect of the stent is limited but the overall size and configuration has minimally changed from the previous examination. Mediastinum/Nodes: No significant chest lymphadenopathy. No significant pericardial fluid. Lungs/Pleura: There appears to be mild left pleural thickening which is unchanged from the previous examination. No significant pleural fluid. Trachea and mainstem bronchi are patent. Stable punctate nodule in the left lower lobe on sequence 15, image 106. Volume loss in left lower lobe related to the descending thoracic aortic  aneurysm. Evidence for scarring in the medial left upper lobe. No new areas of airspace disease or consolidation. Subtle ground-glass density in the right upper lung on sequence 15, image 34 is chronic. Upper Abdomen: The liver is mildly heterogeneous and most likely related to the timing of the study. No acute abnormality in the upper abdomen. Musculoskeletal: Disc space narrowing and endplate changes in lower cervical spine. Review of the MIP images confirms the above findings. IMPRESSION: Endovascular repair of the descending thoracic aortic aneurysm with coil and liquid embolization of the type 1B endoleak. The aneurysm sac has minimally changed in size and there is no convincing evidence of an active endoleak. No acute chest abnormalities. Cardiomegaly with marked enlargement of the right atrium and left atrium. Evidence for increased right heart pressures. Electronically Signed   By: Markus Daft M.D.   On: 11/20/2016 12:35   Assessment and Plan:  Clinically doing very well. She looks great today.  CTA imaging again demonstrates no evidence of residual endoleak and a stable to slightly decreased thoracic aneurysm sac. Endovascular repair of her type I b endoleak continues to be effective. Repeat CTA and clinic visit in 1 year   Electronically Signed: Ascencion Dike 11/20/2016, 2:13 PM   I spent a total of 15 Minutes in face to face in clinical consultation, greater than 50% of which was counseling/coordinating care for Thoracic aortic aneurysm withType I b endoleak

## 2016-12-05 ENCOUNTER — Ambulatory Visit (INDEPENDENT_AMBULATORY_CARE_PROVIDER_SITE_OTHER): Payer: Medicare Other | Admitting: *Deleted

## 2016-12-05 DIAGNOSIS — Z5181 Encounter for therapeutic drug level monitoring: Secondary | ICD-10-CM | POA: Diagnosis not present

## 2016-12-05 DIAGNOSIS — I4891 Unspecified atrial fibrillation: Secondary | ICD-10-CM

## 2016-12-05 LAB — POCT INR: INR: 2.3

## 2016-12-28 ENCOUNTER — Encounter: Payer: Self-pay | Admitting: Interventional Radiology

## 2017-01-11 ENCOUNTER — Ambulatory Visit (INDEPENDENT_AMBULATORY_CARE_PROVIDER_SITE_OTHER): Payer: Medicare Other | Admitting: Pharmacist

## 2017-01-11 DIAGNOSIS — Z5181 Encounter for therapeutic drug level monitoring: Secondary | ICD-10-CM

## 2017-01-11 DIAGNOSIS — I4891 Unspecified atrial fibrillation: Secondary | ICD-10-CM

## 2017-01-11 LAB — POCT INR: INR: 2.6

## 2017-01-14 ENCOUNTER — Other Ambulatory Visit: Payer: Self-pay | Admitting: Pharmacist

## 2017-01-14 MED ORDER — WARFARIN SODIUM 5 MG PO TABS: 5.0000 mg | ORAL_TABLET | Freq: Every day | ORAL | 0 refills | Status: DC

## 2017-02-20 ENCOUNTER — Ambulatory Visit (INDEPENDENT_AMBULATORY_CARE_PROVIDER_SITE_OTHER): Payer: Medicare Other | Admitting: *Deleted

## 2017-02-20 DIAGNOSIS — I4891 Unspecified atrial fibrillation: Secondary | ICD-10-CM | POA: Diagnosis not present

## 2017-02-20 DIAGNOSIS — Z5181 Encounter for therapeutic drug level monitoring: Secondary | ICD-10-CM

## 2017-02-20 LAB — POCT INR: INR: 2.2

## 2017-02-21 DIAGNOSIS — D485 Neoplasm of uncertain behavior of skin: Secondary | ICD-10-CM | POA: Diagnosis not present

## 2017-02-21 DIAGNOSIS — C44519 Basal cell carcinoma of skin of other part of trunk: Secondary | ICD-10-CM | POA: Diagnosis not present

## 2017-02-21 DIAGNOSIS — D692 Other nonthrombocytopenic purpura: Secondary | ICD-10-CM | POA: Diagnosis not present

## 2017-02-21 DIAGNOSIS — L821 Other seborrheic keratosis: Secondary | ICD-10-CM | POA: Diagnosis not present

## 2017-02-21 DIAGNOSIS — D1801 Hemangioma of skin and subcutaneous tissue: Secondary | ICD-10-CM | POA: Diagnosis not present

## 2017-02-21 DIAGNOSIS — L918 Other hypertrophic disorders of the skin: Secondary | ICD-10-CM | POA: Diagnosis not present

## 2017-03-27 DIAGNOSIS — C44519 Basal cell carcinoma of skin of other part of trunk: Secondary | ICD-10-CM | POA: Diagnosis not present

## 2017-04-03 ENCOUNTER — Ambulatory Visit (INDEPENDENT_AMBULATORY_CARE_PROVIDER_SITE_OTHER): Payer: Medicare Other | Admitting: Family Medicine

## 2017-04-03 ENCOUNTER — Encounter: Payer: Self-pay | Admitting: Family Medicine

## 2017-04-03 VITALS — BP 120/70 | HR 76 | Resp 16 | Ht 62.0 in | Wt 103.8 lb

## 2017-04-03 DIAGNOSIS — I4819 Other persistent atrial fibrillation: Secondary | ICD-10-CM

## 2017-04-03 DIAGNOSIS — I481 Persistent atrial fibrillation: Secondary | ICD-10-CM | POA: Diagnosis not present

## 2017-04-03 DIAGNOSIS — I10 Essential (primary) hypertension: Secondary | ICD-10-CM

## 2017-04-03 DIAGNOSIS — Z7901 Long term (current) use of anticoagulants: Secondary | ICD-10-CM

## 2017-04-03 DIAGNOSIS — R0600 Dyspnea, unspecified: Secondary | ICD-10-CM | POA: Diagnosis not present

## 2017-04-03 DIAGNOSIS — R296 Repeated falls: Secondary | ICD-10-CM | POA: Diagnosis not present

## 2017-04-03 DIAGNOSIS — Z23 Encounter for immunization: Secondary | ICD-10-CM

## 2017-04-03 DIAGNOSIS — I272 Pulmonary hypertension, unspecified: Secondary | ICD-10-CM | POA: Diagnosis not present

## 2017-04-03 DIAGNOSIS — I700 Atherosclerosis of aorta: Secondary | ICD-10-CM | POA: Diagnosis not present

## 2017-04-03 DIAGNOSIS — E785 Hyperlipidemia, unspecified: Secondary | ICD-10-CM | POA: Diagnosis not present

## 2017-04-03 DIAGNOSIS — Z8349 Family history of other endocrine, nutritional and metabolic diseases: Secondary | ICD-10-CM

## 2017-04-03 NOTE — Patient Instructions (Signed)
  [name] [name] , Thank you for taking time to come for your Medicare Wellness Visit. I appreciate your ongoing commitment to your health goals. Please review the following plan we discussed and let me know if I can assist you in the future.   These are the goals we discussed: Goals    None      This is a list of the screening recommended for you and due dates:  Health Maintenance  Topic Date Due  . DEXA scan (bone density measurement)  01/26/1999  . Pneumonia vaccines (2 of 2 - PPSV23) 09/08/2015  . Flu Shot  02/13/2017  . Tetanus Vaccine  05/16/2019

## 2017-04-03 NOTE — Progress Notes (Signed)
[name] [name] is a 83 y.o. female who presents for annual wellness visit and follow-up on chronic medical conditions.  She has the following concerns: She does have a history of multiple falls and does live in a dwelling that requires her to go up and down steps. She does use a med alert at home. She does still quite fatigued with becoming easily short of breath with some dyspnea. She does not complain of chest pain. She has a history of atrial fibrillation as well as pulmonary hypertension. Doesn't really she is not involved in an exercise program. Her medications were reviewed and are listed in the record. She has a follow-up appointment with cardiology in the near future. There is also a family history of B12 deficiency with a sister and daughter both on B12 supplements. Discussion with her indicates no cognitive dysfunction. She does continue to drive. He does have a previous history of thoracic aneurysm with [name]. There is also question of abdominal aortic aneurysm but I cannot find adequate documentation. Scans done does show evidence of atherosclerosis of the abdominal aorta  Immunizations and Health Maintenance Immunization History  Administered Date(s) Administered  . Influenza Split 04/15/2013, 04/15/2014, 05/11/2015  . Pneumococcal Conjugate-13 09/07/2014  . Tdap 05/15/2009   Health Maintenance Due  Topic Date Due  . DEXA SCAN  01/26/1999  . PNA vac Low Risk Adult (2 of 2 - PPSV23) 09/08/2015  . INFLUENZA VACCINE  02/13/2017    Last Pap smear: 5 yrs ago Last mammogram:  2 yrs ago- "states she has aged out"  Last colonoscopy: never Last DEXA: Negative  Dentist:  [place]- can't pronounce name.  Ophtho:  [name]: Unable to exercise  [name] [name]. Advanced directives: Yes    Depression screen:  See questionnaire below.  Depression screen [facility] 2/9 04/03/2017 09/07/2014  Decreased Interest 0 0   Down, Depressed, Hopeless 0 0  PHQ - 2 Score 0 0    Fall Risk Screen: see questionnaire below. Fall Risk  04/03/2017 03/15/2016 09/07/2014  Falls in the past year? Yes Yes No  Comment - [facility] Telephone Survey: data to providers prior to load -  Number falls in past yr: 1 2 or more -  Comment - [facility] Telephone Survey Actual Response = 2 -  Injury with Fall? No Yes -  Risk for fall due to : Impaired balance/gait - -    ADL screen:  See questionnaire below Functional Status Survey: Is the patient deaf or have difficulty hearing?: No Does the patient have difficulty seeing, even when wearing glasses/contacts?: No Does the patient have difficulty concentrating, remembering, or making decisions?: No Does the patient have difficulty walking or climbing stairs?: Yes Does the patient have difficulty dressing or bathing?: No Does the patient have difficulty doing errands alone such as visiting a doctor's office or shopping?: No   Review of Systems Constitutional: -, -unexpected weight change, -anorexia, -fatigue Allergy: -sneezing, -itching, -congestion Dermatology: denies changing moles, rash, lumps ENT: -runny nose, -ear pain, -sore throat,  Cardiology:  -chest pain, -palpitations, -orthopnea, Respiratory: -cough, -shortness of breath, -dyspnea on exertion, -wheezing,  Gastroenterology: -abdominal pain, -nausea, -vomiting, -diarrhea, -constipation, -dysphagia Hematology: -bleeding or bruising problems Musculoskeletal: -arthralgias, -myalgias, -joint swelling, -back pain, - Ophthalmology: -vision changes,  Urology: -dysuria, -difficulty urinating,  -urinary frequency, -urgency, incontinence Neurology: -, -numbness, , -memory loss, -falls, -dizziness    PHYSICAL EXAM:  General Appearance: Alert, cooperative, no distress, appears stated  age Head: Normocephalic, without obvious abnormality, atraumatic Eyes: PERRL, conjunctiva/corneas clear, EOM's intact, fundi benign Ears: Normal TM's  and external ear canals Nose: Nares normal, mucosa normal, no drainage or sinus tenderness Throat: Lips, mucosa, and tongue normal; teeth and gums normal Neck: Supple, no lymphadenopathy;  thyroid:  no enlargement/tenderness/nodules; no carotid bruit or JVD Lungs: Clear to auscultation bilaterally without wheezes, rales or ronchi; respirations unlabored Heart: Regular rate and rhythm, S1 and S2 normal, no murmur, rubor gallop Abdomen: Soft, non-tender, nondistended, normoactive bowel sounds,  no masses, no hepatosplenomegaly Extremities: No clubbing, cyanosis or edema area and lower extremities do show chronic skin changes Pulses: 2+ and symmetric all extremities Skin:  Skin color, texture, turgor normal, no rashes or lesions Lymph nodes: Cervical, supraclavicular, and axillary nodes normal Neurologic:  CNII-XII intact, normal strength, sensation and gait; reflexes 2+ and symmetric throughout Psych: Normal mood, affect, hygiene and grooming.  ASSESSMENT/PLAN: Need for vaccination against Streptococcus pneumoniae - Plan: Pneumococcal polysaccharide vaccine 23-valent greater than or equal to 2yo subcutaneous/IM  Family history of B12 deficiency - Plan: Vitamin B12  Multiple falls - Plan: CBC with Differential/Platelet, Comprehensive metabolic panel  Current use of long term anticoagulation  Persistent atrial fibrillation ([place]) - Plan: CBC with Differential/Platelet, Comprehensive metabolic panel  Pulmonary hypertension ([name]) - Plan: CBC with Differential/Platelet, Comprehensive metabolic panel  Long term current use of anticoagulant therapy  Essential hypertension - Plan: CBC with Differential/Platelet, Comprehensive metabolic panel  Hyperlipidemia, unspecified hyperlipidemia type - Plan: Lipid panel  Dyspnea, unspecified type  Atherosclerosis of abdominal aorta ([facility])     Discussed monthly self breast exams and yearly mammograms; at least 30 minutes of aerobic activity at least  5 days/week and weight-bearing exercise 2x/week; proper sunscreen use reviewed; healthy diet, including goals of calcium and vitamin D intake and alcohol recommendations (less than or equal to 1 drink/day) reviewed; regular seatbelt use; changing batteries in smoke detectors.  Immunization recommendations discussed.  Colonoscopy recommendations reviewed   Medicare Attestation I have personally reviewed: The patient's medical and social history Their use of alcohol, tobacco or illicit drugs Their current medications and supplements The patient's functional ability including ADLs,fall risks, home safety risks, cognitive, and hearing and visual impairment Diet and physical activities Evidence for depression or mood disorders  The patient's weight, height, and BMI have been recorded in the chart.  I have made referrals, counseling, and provided education to the patient based on review of the above and I have provided the patient with a written personalized care plan for preventive services.     [name], MD   04/03/2017

## 2017-04-04 LAB — COMPREHENSIVE METABOLIC PANEL
AG Ratio: 1.6 (calc) (ref 1.0–2.5)
ALT: 12 U/L (ref 6–29)
AST: 17 U/L (ref 10–35)
Albumin: 4.4 g/dL (ref 3.6–5.1)
Alkaline phosphatase (APISO): 202 U/L — ABNORMAL HIGH (ref 33–130)
BUN / CREAT RATIO: 30 (calc) — AB (ref 6–22)
BUN: 36 mg/dL — AB (ref 7–25)
CO2: 25 mmol/L (ref 20–32)
CREATININE: 1.21 mg/dL — AB (ref 0.60–0.88)
Calcium: 9.7 mg/dL (ref 8.6–10.4)
Chloride: 104 mmol/L (ref 98–110)
GLUCOSE: 109 mg/dL — AB (ref 65–99)
Globulin: 2.8 g/dL (calc) (ref 1.9–3.7)
Potassium: 4.4 mmol/L (ref 3.5–5.3)
SODIUM: 140 mmol/L (ref 135–146)
TOTAL PROTEIN: 7.2 g/dL (ref 6.1–8.1)
Total Bilirubin: 1.1 mg/dL (ref 0.2–1.2)

## 2017-04-04 LAB — CBC WITH DIFFERENTIAL/PLATELET
BASOS ABS: 59 {cells}/uL (ref 0–200)
Basophils Relative: 1 %
EOS PCT: 1.9 %
Eosinophils Absolute: 112 cells/uL (ref 15–500)
HCT: 34.9 % — ABNORMAL LOW (ref 35.0–45.0)
HEMOGLOBIN: 11.2 g/dL — AB (ref 11.7–15.5)
Lymphs Abs: 926 cells/uL (ref 850–3900)
MCH: 28.6 pg (ref 27.0–33.0)
MCHC: 32.1 g/dL (ref 32.0–36.0)
MCV: 89 fL (ref 80.0–100.0)
MPV: 9.3 fL (ref 7.5–12.5)
Monocytes Relative: 12.6 %
NEUTROS ABS: 4059 {cells}/uL (ref 1500–7800)
NEUTROS PCT: 68.8 %
Platelets: 242 10*3/uL (ref 140–400)
RBC: 3.92 10*6/uL (ref 3.80–5.10)
RDW: 13.1 % (ref 11.0–15.0)
Total Lymphocyte: 15.7 %
WBC mixed population: 743 cells/uL (ref 200–950)
WBC: 5.9 10*3/uL (ref 3.8–10.8)

## 2017-04-04 LAB — LIPID PANEL
Cholesterol: 197 mg/dL (ref <200)
HDL: 51 mg/dL (ref >50)
LDL CHOLESTEROL (CALC): 128 mg/dL — AB
Non-HDL Cholesterol (Calc): 146 mg/dL (calc) — ABNORMAL HIGH (ref <130)
TRIGLYCERIDES: 82 mg/dL (ref <150)
Total CHOL/HDL Ratio: 3.9 (calc) (ref <5.0)

## 2017-04-04 LAB — VITAMIN B12: Vitamin B-12: 326 pg/mL (ref 200–1100)

## 2017-04-10 ENCOUNTER — Encounter: Payer: Self-pay | Admitting: Cardiovascular Disease

## 2017-04-10 ENCOUNTER — Ambulatory Visit (INDEPENDENT_AMBULATORY_CARE_PROVIDER_SITE_OTHER): Payer: Medicare Other | Admitting: Pharmacist

## 2017-04-10 DIAGNOSIS — I4891 Unspecified atrial fibrillation: Secondary | ICD-10-CM

## 2017-04-10 DIAGNOSIS — Z5181 Encounter for therapeutic drug level monitoring: Secondary | ICD-10-CM

## 2017-04-10 LAB — POCT INR: INR: 2.4

## 2017-04-19 ENCOUNTER — Ambulatory Visit (INDEPENDENT_AMBULATORY_CARE_PROVIDER_SITE_OTHER): Payer: Medicare Other | Admitting: Cardiovascular Disease

## 2017-04-19 ENCOUNTER — Encounter: Payer: Self-pay | Admitting: Cardiovascular Disease

## 2017-04-19 VITALS — BP 106/72 | HR 59 | Ht 62.0 in | Wt 103.0 lb

## 2017-04-19 DIAGNOSIS — I482 Chronic atrial fibrillation, unspecified: Secondary | ICD-10-CM

## 2017-04-19 DIAGNOSIS — I272 Pulmonary hypertension, unspecified: Secondary | ICD-10-CM

## 2017-04-19 MED ORDER — SPIRONOLACTONE 25 MG PO TABS: ORAL_TABLET | ORAL | 3 refills | Status: AC

## 2017-04-19 NOTE — Patient Instructions (Signed)
Medication Instructions:  Your physician recommends that you continue on your current medications as directed. Please refer to the Current Medication list given to you today.   Labwork: None Ordered   Testing/Procedures: None Ordered   Follow-Up: Your physician wants you to follow-up in: 6 months with Dr. Nahser.  You will receive a reminder letter in the mail two months in advance. If you don't receive a letter, please call our office to schedule the follow-up appointment.   If you need a refill on your cardiac medications before your next appointment, please call your pharmacy.   Thank you for choosing [facility]! Tully Mcinturff, RN [phone]    

## 2017-04-19 NOTE — Addendum Note (Signed)
Addended by: Emmaline Life on: 04/19/2017 11:16 AM   Modules accepted: Orders

## 2017-04-19 NOTE — Progress Notes (Signed)
Cardiology Office Note   Date:  04/19/2017   ID:  [name] [name], DOB [birth year], MRN [id number]  PCP:  [name], MD  Cardiologist:   Mertie Moores, MD   Chief Complaint  Patient presents with  . Atrial Fibrillation   Problem List:  1. Atrial fibrillation-we have tried her on Rythmol, flecainide, Sotalol, and amiodarone. Her insurance company will not pay for [name]. 2. Hypertension 3. Thoracic aortic aneurism [employer]) - s/p stent graft repair. Complicated by a type III endoleak. Repaired with another stenting.  4. Pulmonary Hypertension - moderate , est. PA pressures of 55.   August 25, 2014   [name] [name] is a 83 y.o. female who presents for her atrial fib.  Same symptoms.  Still short of breath.  Has lots of fatigue with any exercise. She continues to have Afib.  Has tried multiple meds.  Has had lots of back pain - since her thoracic aortic surgery .  She gets very fatigued with any sort of walking or exercise  February 23, 2015:  Still having lots of back pain - has lost her balance.  Is short of breath also  Has fallen on several occasions  Needs to have vascular surgery later this month.   Her stent graft has an endoleak .   Feb. 8, 2017  Doing ok Still has DOE - possibly a bit worse than before  Still in atrial fib   Has had an aortic aneurism stent graft repair.  Had an endoleak, with repair.     Aug. 28 , 2017: Doing about the same.  Breathing is still short - slightly worse Has had balance issues.  Has not seen neurology   September 13, 2016:  Doing well Reading [name] -  Cardiac situation is stable .  Gets winded with any exertion . Has pulmonary HTN . Having balance issues.   Oct. 5, 2018:  [name] [name] is seen today for her AF Has severe DOE,  O2 sats are good at rest . Is taking B12 liquid .  She was found to have very low end of normal B12 level  Past Medical History:  Diagnosis Date  . AF (atrial  fibrillation) ([place])   . Anemia   . Aortic aneurysm ([place])   . Aortic insufficiency   . Arthritis   . Broken clavicle August 2016  . Broken neck ([place])    40 years ago  . CAD (coronary artery disease)   . CHF (congestive heart failure) ([place])   . Claustrophobia   . COPD (chronic obstructive pulmonary disease) ([place])   . Dysrhythmia   . GERD (gastroesophageal reflux disease)   . Heart murmur   . History of bronchitis   . History of pneumonia   . Hypertension   . Mitral regurgitation   . Shortness of breath   . Tricuspid regurgitation   . Urinary frequency   . Urinary incontinence   . Wears glasses     Past Surgical History:  Procedure Laterality Date  . CARDIAC CATHETERIZATION    . CYSTECTOMY     sacrum  . IR RADIOLOGIST EVAL & MGMT  11/20/2016  . RADIOLOGY WITH ANESTHESIA N/A 03/16/2015   Procedure: Endoleak Rapair;  Surgeon: Jacqulynn Cadet, MD;  Location: [facility];  Service: Radiology;  Laterality: N/A;  . THORACIC AORTIC ENDOVASCULAR STENT GRAFT N/A 10/02/2012   Procedure: THORACIC AORTIC ENDOVASCULAR STENT GRAFT;  Surgeon: Serafina Mitchell, MD;  Location: [facility];  Service: Vascular;  Laterality: N/A;  Ultrasound guided  . THORACIC AORTIC ENDOVASCULAR STENT GRAFT N/A 10/05/2012   Procedure: Endovascular repair of THORACIC AORTIC ANEURYSM;  Surgeon: Serafina Mitchell, MD;  [facility];  Service: Vascular;  Laterality: N/A;  Angioplasty of Aortic throasic aneurysm, x1 aortagram,  Percutaneous acess right femoral artery.  . TONSILLECTOMY       Current Outpatient Prescriptions  Medication Sig Dispense Refill  . acetaminophen (TYLENOL) 500 MG tablet Take 1,000 mg by mouth every 4 (four) hours as needed for pain. Reported on 09/14/2015    . furosemide (LASIX) 20 MG tablet Take 1 tablet (20 mg total) by mouth daily. 90 tablet 3  . prazosin (MINIPRESS) 2 MG capsule Take 1 capsule (2 mg total) by mouth at bedtime. 90 capsule 3  . propranolol ER (INDERAL LA) 160 MG SR capsule Take 1 capsule  (160 mg total) by mouth at bedtime. 90 capsule 3  . spironolactone (ALDACTONE) 25 MG tablet TAKE ONE HALF TABLET BY MOUTH EVERY MORNING 45 tablet 3  . warfarin (COUMADIN) 5 MG tablet Take as directed by Coumadin clinic. 60 tablet 1   No current facility-administered medications for this visit.     Allergies:   Atorvastatin; Amiodarone hcl; Diltiazem hcl; Flecainide acetate; Metoprolol succinate; and Sotalol hcl    Social History:  The patient  reports that she quit smoking about 31 years ago. Her smoking use included Cigarettes. She has a 20.00 pack-year smoking history. She has never used smokeless tobacco. She reports that she drinks about 5.4 oz of alcohol per week . She reports that she does not use drugs.   Family History:  The patient's family history includes Aneurysm (age of onset: 60) in her mother; Cancer in her father and mother; Heart disease in her mother; Hodgkin's lymphoma (age of onset: 78) in her father; Hypertension in her mother.    ROS:  Please see the history of present illness.    Review of Systems: Constitutional:  denies fever, chills, diaphoresis, appetite change and fatigue.  HEENT: denies photophobia, eye pain, redness, hearing loss, ear pain, congestion, sore throat, rhinorrhea, sneezing, neck pain, neck stiffness and tinnitus.  Respiratory: denies SOB, DOE, cough, chest tightness, and wheezing.  Cardiovascular: denies chest pain, palpitations and leg swelling.  Gastrointestinal: denies nausea, vomiting, abdominal pain, diarrhea, constipation, blood in stool.  Genitourinary: denies dysuria, urgency, frequency, hematuria, flank pain and difficulty urinating.  Musculoskeletal: denies  myalgias, back pain, joint swelling, arthralgias and gait problem.   Skin: denies pallor, rash and wound.  Neurological: denies dizziness, seizures, syncope, weakness, light-headedness, numbness and headaches.   Hematological: denies adenopathy, easy bruising, personal or family  bleeding history.  Psychiatric/ Behavioral: denies suicidal ideation, mood changes, confusion, nervousness, sleep disturbance and agitation.       All other systems are reviewed and negative.   Physical Exam: Blood pressure 106/72, pulse (!) 59, height 5\' 2"  (1.575 m), weight 103 lb (46.7 kg), SpO2 99 %.  GEN:  Well nourished, well developed in no acute distress HEENT: Normal NECK: No JVD; No carotid bruits LYMPHATICS: No lymphadenopathy CARDIAC: Irreg, Irreg. , no murmurs, rubs, gallops RESPIRATORY:  Clear to auscultation without rales, wheezing or rhonchi  ABDOMEN: Soft, non-tender, non-distended MUSCULOSKELETAL:  No edema; No deformity  SKIN: Warm and dry NEUROLOGIC:  Alert and oriented x 3    EKG:  EKG is ordered today. The ekg ordered today demonstrates atrial fib with rate of 84    Recent Labs: 04/03/2017: ALT  12; BUN 36; Creat 1.21; Hemoglobin 11.2; Platelets 242; Potassium 4.4; Sodium 140    Lipid Panel    Component Value Date/Time   CHOL 197 04/03/2017 1016   TRIG 82 04/03/2017 1016   HDL 51 04/03/2017 1016   CHOLHDL 3.9 04/03/2017 1016   VLDL 13 09/07/2014 1132   LDLCALC 136 (H) 09/07/2014 1132   LDLDIRECT 127.6 08/18/2012 1011      Wt Readings from Last 3 Encounters:  04/19/17 103 lb (46.7 kg)  04/03/17 103 lb 12.8 oz (47.1 kg)  11/20/16 106 lb (48.1 kg)      Other studies Reviewed: Additional studies/ records that were reviewed today include: . Review of the above records demonstrates:   ECG :  Personally reviewed  .   ASSESSMENT AND PLAN:  1. Atrial fibrillation- Chronic AF .   HR is stable   2. Hypertension -   Blood pressures well-controlled.  3. Thoracic aortic aneurism ( [place]) -   4. Pulmonary Hypertension -  She has significant pulmonary hypertension due to her COPD. She has seen pulmonary   4. Aortic insufficiency  ;  Stable   I will see her back in 6 months   Current medicines are reviewed at length with the  patient today.  The patient does not have concerns regarding medicines.  The following changes have been made:  no change   Disposition:   FU with me in 6 months .     Signed, Mertie Moores, MD  04/19/2017 10:58 AM    [facility] [place], [place], [place]  [address] Phone: [phone]; Fax: [phone]

## 2017-05-22 ENCOUNTER — Ambulatory Visit (INDEPENDENT_AMBULATORY_CARE_PROVIDER_SITE_OTHER): Payer: Medicare Other

## 2017-05-22 DIAGNOSIS — Z5181 Encounter for therapeutic drug level monitoring: Secondary | ICD-10-CM | POA: Diagnosis not present

## 2017-05-22 DIAGNOSIS — I4891 Unspecified atrial fibrillation: Secondary | ICD-10-CM | POA: Diagnosis not present

## 2017-05-22 LAB — POCT INR: INR: 2.4

## 2017-07-03 ENCOUNTER — Ambulatory Visit (INDEPENDENT_AMBULATORY_CARE_PROVIDER_SITE_OTHER): Payer: Medicare Other

## 2017-07-03 DIAGNOSIS — Z5181 Encounter for therapeutic drug level monitoring: Secondary | ICD-10-CM | POA: Diagnosis not present

## 2017-07-03 DIAGNOSIS — I4891 Unspecified atrial fibrillation: Secondary | ICD-10-CM

## 2017-07-03 LAB — POCT INR: INR: 3.4

## 2017-07-03 NOTE — Patient Instructions (Signed)
Skip today's dosage of Coumadin, then resume same dosage 1/2 tablet daily.  Recheck INR 4 weeks. Call with any new or different & any procedures [phone].

## 2017-07-19 ENCOUNTER — Ambulatory Visit (INDEPENDENT_AMBULATORY_CARE_PROVIDER_SITE_OTHER): Payer: Medicare Other | Admitting: Family Medicine

## 2017-07-19 ENCOUNTER — Encounter: Payer: Self-pay | Admitting: Family Medicine

## 2017-07-19 ENCOUNTER — Telehealth: Payer: Self-pay | Admitting: Cardiovascular Disease

## 2017-07-19 ENCOUNTER — Ambulatory Visit
Admission: RE | Admit: 2017-07-19 | Discharge: 2017-07-19 | Disposition: A | Discharge status: Home / Self Care | Payer: Medicare Other | Source: Ambulatory Visit | Attending: Family Medicine | Admitting: Family Medicine

## 2017-07-19 VITALS — BP 112/62 | HR 63 | Ht 62.0 in | Wt 102.8 lb

## 2017-07-19 DIAGNOSIS — R0600 Dyspnea, unspecified: Secondary | ICD-10-CM

## 2017-07-19 DIAGNOSIS — M542 Cervicalgia: Secondary | ICD-10-CM | POA: Diagnosis not present

## 2017-07-19 DIAGNOSIS — R062 Wheezing: Secondary | ICD-10-CM | POA: Diagnosis not present

## 2017-07-19 DIAGNOSIS — I4891 Unspecified atrial fibrillation: Secondary | ICD-10-CM

## 2017-07-19 NOTE — Telephone Encounter (Signed)
New Message  Dr. Redmond School call requesting to speak with Dr. Acie Fredrickson.

## 2017-07-19 NOTE — Progress Notes (Signed)
   Subjective:    Patient ID: [name] [name], female    DOB: Jan 14, 1932, 84 y.o.   MRN: [id number]  HPI She complains of a several year history of difficulty with right-sided neck pain.  She states that when she starts to walk she will become short of breath, developed right-sided neck pain as well as pain down her entire right side and into her back as well as fatigue.  She also has some slight shortness of breath with this.  It takes roughly 15 minutes for her to get her strength back.  She is followed by Dr. Acie Fredrickson for her underlying atrial fibrillation.  His notes were reviewed.  She also states that over the last week she has noted intermittent difficulty with wheezing.  Has no previous history of wheezing.  No cough, congestion, fever or chills.  She also complains of occasional difficulty with acid reflux symptoms. She would like to be considered for a wheelchair because of her inability to walk.  Review of Systems     Objective:   Physical Exam Alert and in no distress.  Full motion of her neck without pain.  Cardiac exam shows an irregular rhythm.  Lungs are clear to auscultation.      Assessment & Plan:  Wheezing - Plan: DG Chest 2 View  Atrial fibrillation, unspecified type (HCC)  Dyspnea, unspecified type  Neck pain Some of her symptoms sound cardiac in origin but I cannot really explain why she would have neck pain with this.  I will discuss this further with her cardiologist. After discussion with him, he will call her for follow-up appointment to evaluate her cardiac risk.

## 2017-07-23 ENCOUNTER — Encounter: Payer: Self-pay | Admitting: Cardiovascular Disease

## 2017-07-23 ENCOUNTER — Ambulatory Visit (INDEPENDENT_AMBULATORY_CARE_PROVIDER_SITE_OTHER): Payer: Medicare Other | Admitting: Cardiovascular Disease

## 2017-07-23 VITALS — BP 114/86 | HR 71 | Ht 62.0 in | Wt 105.4 lb

## 2017-07-23 DIAGNOSIS — I200 Unstable angina: Secondary | ICD-10-CM | POA: Diagnosis not present

## 2017-07-23 DIAGNOSIS — I25110 Atherosclerotic heart disease of native coronary artery with unstable angina pectoris: Secondary | ICD-10-CM

## 2017-07-23 DIAGNOSIS — I272 Pulmonary hypertension, unspecified: Secondary | ICD-10-CM

## 2017-07-23 NOTE — Patient Instructions (Addendum)
Medication Instructions:  Your physician recommends that you continue on your current medications as directed. Please refer to the Current Medication list given to you today.   Labwork: Your physician recommends that you return for lab work in: on Thursday 1/17   Testing/Procedures: Your physician has requested that you have a cardiac catheterization. Cardiac catheterization is used to diagnose and/or treat various heart conditions. Doctors may recommend this procedure for a number of different reasons. The most common reason is to evaluate chest pain. Chest pain can be a symptom of coronary artery disease (CAD), and cardiac catheterization can show whether plaque is narrowing or blocking your heart's arteries. This procedure is also used to evaluate the valves, as well as measure the blood flow and oxygen levels in different parts of your heart. For further information please visit [email or web address]. Please follow instruction sheet, as given.   Follow-Up: Your physician recommends that you schedule a follow-up appointment in: 3 months with Dr. Acie Fredrickson   If you need a refill on your cardiac medications before your next appointment, please call your pharmacy.   Thank you for choosing [facility]! Christen Bame, RN [phone]    [facility] OFFICE [address], [place] [address] [place] [address] Dept: [phone] Loc: [place]  07/23/2017  You are scheduled for a Cardiac Catheterization on Friday, January 18 with Dr. Harrell Gave End.  1. Please arrive at the [facility] (Main Entrance A) at [facility]: [place], [place] [address] at 8:30 AM (two hours before your procedure to ensure your preparation). Free valet parking service is available.   Special note: Every effort is made to have your procedure done on time. Please understand that emergencies  sometimes delay scheduled procedures.  2. Diet: Do not eat or drink anything after midnight prior to your procedure except sips of water to take medications.  3. Labs: You will need to have blood drawn on Thursday, January 17 at [facility]. [address]  Open: 7:30am - 5pm    Phone: [phone]. You do not need to be fasting.  4. Medication instructions in preparation for your procedure:   Stop taking Coumadin (Warfarin) on Sunday, January 13. - This is your last dose HOLD Lasix (Furosemide) on the morning of the cath    On the morning of your procedure, take any morning medicines NOT listed above.  You may use sips of water.  5. Plan for one night stay--bring personal belongings. 6. Bring a current list of your medications and current insurance cards. 7. You MUST have a responsible person to drive you home. 8. Someone MUST be with you the first 24 hours after you arrive home or your discharge will be delayed. 9. Please wear clothes that are easy to get on and off and wear slip-on shoes.  Thank you for allowing [place] to care for you!   -- [facility]

## 2017-07-23 NOTE — Progress Notes (Signed)
Cardiology Office Note   Date:  07/23/2017   ID:  [name] [name], DOB [birth year], MRN [id number]  PCP:  [name], MD  Cardiologist:   Mertie Moores, MD   Chief Complaint  Patient presents with  . Chest Pain  . Atrial Fibrillation   Problem List:  1. Atrial fibrillation-we have tried her on Rythmol, flecainide, Sotalol, and amiodarone. Her insurance company will not pay for [name]. 2. Hypertension 3. Thoracic aortic aneurism [employer]) - s/p stent graft repair. Complicated by a type III endoleak. Repaired with another stenting.  4. Pulmonary Hypertension - moderate , est. PA pressures of 55.   August 25, 2014   [name] [name] is a 84 y.o. female who presents for her atrial fib.  Same symptoms.  Still short of breath.  Has lots of fatigue with any exercise. She continues to have Afib.  Has tried multiple meds.  Has had lots of back pain - since her thoracic aortic surgery .  She gets very fatigued with any sort of walking or exercise  February 23, 2015:  Still having lots of back pain - has lost her balance.  Is short of breath also  Has fallen on several occasions  Needs to have vascular surgery later this month.   Her stent graft has an endoleak .   Feb. 8, 2017  Doing ok Still has DOE - possibly a bit worse than before  Still in atrial fib   Has had an aortic aneurism stent graft repair.  Had an endoleak, with repair.     Aug. 28 , 2017: Doing about the same.  Breathing is still short - slightly worse Has had balance issues.  Has not seen neurology   September 13, 2016:  Doing well Reading [name] [name] [employer] -  Cardiac situation is stable .  Gets winded with any exertion . Has pulmonary HTN . Having balance issues.   Oct. 5, 2018:  [name] [name] is seen today for her AF Has severe DOE,  O2 sats are good at rest . Is taking B12 liquid .  She was found to have very low end of normal B12 level  July 23, 2016: [name] [name] is seen today for work  in visit.  She is been having some right sided neck pain with exertion.  She states that she can walk 10 or 12 steps before she develops right neck pain that radiates down to her shoulder and into her back.  It radiates down her side and then develops a burning sensation inside her body.  This episode might last for a minute or so. She developed this right shoulder pain every time she walks any distance at all. She is fairly comfortable at rest.  She has a history of chronic atrial fibrillation.  She is on chronic Coumadin therapy. Has severe pulmonary HTN Has been eating some salty almonds Has had left ankle swelling for a week.   Past Medical History:  Diagnosis Date  . AF (atrial fibrillation) ([place])   . Anemia   . Aortic aneurysm ([place])   . Aortic insufficiency   . Arthritis   . Broken clavicle August 2016  . Broken neck ([place])    40 years ago  . CAD (coronary artery disease)   . CHF (congestive heart failure) ([place])   . Claustrophobia   . COPD (chronic obstructive pulmonary disease) ([place])   . Dysrhythmia   . GERD (gastroesophageal reflux disease)   . Heart murmur   .  History of bronchitis   . History of pneumonia   . Hypertension   . Mitral regurgitation   . Shortness of breath   . Tricuspid regurgitation   . Urinary frequency   . Urinary incontinence   . Wears glasses     Past Surgical History:  Procedure Laterality Date  . CARDIAC CATHETERIZATION    . CYSTECTOMY     sacrum  . IR RADIOLOGIST EVAL & MGMT  11/20/2016  . RADIOLOGY WITH ANESTHESIA N/A 03/16/2015   Procedure: Endoleak Rapair;  Surgeon: Jacqulynn Cadet, MD;  Location: [facility];  Service: Radiology;  Laterality: N/A;  . THORACIC AORTIC ENDOVASCULAR STENT GRAFT N/A 10/02/2012   Procedure: THORACIC AORTIC ENDOVASCULAR STENT GRAFT;  Surgeon: Serafina Mitchell, MD;  Location: [facility];  Service: Vascular;  Laterality: N/A;  Ultrasound guided  . THORACIC AORTIC ENDOVASCULAR STENT GRAFT N/A 10/05/2012   Procedure:  Endovascular repair of THORACIC AORTIC ANEURYSM;  Surgeon: Serafina Mitchell, MD;  [facility];  Service: Vascular;  Laterality: N/A;  Angioplasty of Aortic throasic aneurysm, x1 aortagram,  Percutaneous acess right femoral artery.  . TONSILLECTOMY       Current Outpatient Medications  Medication Sig Dispense Refill  . acetaminophen (TYLENOL) 500 MG tablet Take 1,000 mg by mouth every 4 (four) hours as needed for pain. Reported on 09/14/2015    . furosemide (LASIX) 20 MG tablet Take 1 tablet (20 mg total) by mouth daily. 90 tablet 3  . prazosin (MINIPRESS) 2 MG capsule Take 1 capsule (2 mg total) by mouth at bedtime. 90 capsule 3  . propranolol ER (INDERAL LA) 160 MG SR capsule Take 1 capsule (160 mg total) by mouth at bedtime. 90 capsule 3  . spironolactone (ALDACTONE) 25 MG tablet TAKE ONE HALF TABLET BY MOUTH EVERY MORNING 45 tablet 3  . warfarin (COUMADIN) 5 MG tablet Take as directed by Coumadin clinic. 60 tablet 1   No current facility-administered medications for this visit.     Allergies:   Atorvastatin; Amiodarone hcl; Diltiazem hcl; Flecainide acetate; Metoprolol succinate; and Sotalol hcl    Social History:  The patient  reports that she quit smoking about 31 years ago. Her smoking use included cigarettes. She has a 20.00 pack-year smoking history. she has never used smokeless tobacco. She reports that she drinks about 5.4 oz of alcohol per week. She reports that she does not use drugs.   Family History:  The patient's family history includes Aneurysm (age of onset: 41) in her mother; Cancer in her father and mother; Heart disease in her mother; Hodgkin's lymphoma (age of onset: 67) in her father; Hypertension in her mother.    ROS:  Please see the history of present illness.   Physical Exam: Blood pressure 114/86, pulse 71, height 5\' 2"  (1.575 m), weight 105 lb 6.4 oz (47.8 kg).  GEN:  Well nourished, well developed in no acute distress HEENT: Normal NECK: No JVD; No  carotid bruits LYMPHATICS: No lymphadenopathy CARDIAC: Irreg. Irreg.   2/6 systolic murmur at the LSB radiating to the axilla  RESPIRATORY:  Clear to auscultation without rales, wheezing or rhonchi  ABDOMEN: Soft, non-tender, non-distended MUSCULOSKELETAL:  Focal area of swelling in her left ankle  SKIN: Warm and dry NEUROLOGIC:  Alert and oriented x 3  EKG:    Recent Labs: 04/03/2017: ALT 12; BUN 36; Creat 1.21; Hemoglobin 11.2; Platelets 242; Potassium 4.4; Sodium 140    Lipid Panel    Component Value Date/Time   CHOL 197  04/03/2017 1016   TRIG 82 04/03/2017 1016   HDL 51 04/03/2017 1016   CHOLHDL 3.9 04/03/2017 1016   VLDL 13 09/07/2014 1132   LDLCALC 136 (H) 09/07/2014 1132   LDLDIRECT 127.6 08/18/2012 1011      Wt Readings from Last 3 Encounters:  07/23/17 105 lb 6.4 oz (47.8 kg)  07/19/17 102 lb 12.8 oz (46.6 kg)  04/19/17 103 lb (46.7 kg)      Other studies Reviewed: Additional studies/ records that were reviewed today include: . Review of the above records demonstrates:   ECG :    ASSESSMENT AND PLAN:  1.  Chest discomfort:  Symptoms  are very suspicious for angina .  Has these symptoms every times she walks . These symptoms may be related to her pulmonary HTN but may also be due to unstable angina  I would like to schedule her for right and left heart catheterization.  He will be important for [place] to differentiate whether or not she has a critical coronary lesion. She is on Coumadin therapy.  Will discontinue her Coumadin 5 days prior to her heart catheterization.   2. Atrial fibrillation-  Chronic , stable      3. Thoracic aortic aneurism ( [place]) -   4. Pulmonary Hypertension -  She has significant pulmonary hypertension due to her COPD. She has seen pulmonary   4. Aortic insufficiency   5.   Hypertension stable   I will see her back in 6 months   Current medicines are reviewed at length with the patient today.  The patient does not have  concerns regarding medicines.  The following changes have been made:  no change   Disposition:   FU with me in 3  months .     Signed, Mertie Moores, MD  07/23/2017 2:14 PM    [facility] [place], [place], [place]  [address] Phone: [phone]; Fax: [phone]

## 2017-07-24 ENCOUNTER — Telehealth: Payer: Self-pay | Admitting: Cardiovascular Disease

## 2017-07-24 NOTE — Telephone Encounter (Signed)
New message   Patient says she would like to cancel her hospital procedure and let the nurse know why Please call

## 2017-07-25 DIAGNOSIS — D485 Neoplasm of uncertain behavior of skin: Secondary | ICD-10-CM | POA: Diagnosis not present

## 2017-07-25 DIAGNOSIS — D0472 Carcinoma in situ of skin of left lower limb, including hip: Secondary | ICD-10-CM | POA: Diagnosis not present

## 2017-07-25 DIAGNOSIS — D0471 Carcinoma in situ of skin of right lower limb, including hip: Secondary | ICD-10-CM | POA: Diagnosis not present

## 2017-07-25 DIAGNOSIS — Z85828 Personal history of other malignant neoplasm of skin: Secondary | ICD-10-CM | POA: Diagnosis not present

## 2017-07-25 DIAGNOSIS — L853 Xerosis cutis: Secondary | ICD-10-CM | POA: Diagnosis not present

## 2017-07-25 DIAGNOSIS — L821 Other seborrheic keratosis: Secondary | ICD-10-CM | POA: Diagnosis not present

## 2017-07-25 NOTE — Telephone Encounter (Signed)
New message ° °Pt verbalized that she is returning call for RN °

## 2017-07-25 NOTE — Telephone Encounter (Signed)
Spoke with patient who requests to cancel cardiac cath. She states she discussed her symptoms with her family and they request that she see a neurosurgeon before having the cath. She requests a referral to neurosurgery from Dr. Acie Fredrickson. I advised her that once the referral is placed (likely tomorrow), she should expect a call from their office to be scheduled. I advised her to call back if she has questions or concerns for [place].  I called [facility] cath lab and spoke with Reino Bellis to cancel patient's cath on 1/18. She verbalized understanding and states she will take care of the cancellation.

## 2017-07-25 NOTE — Telephone Encounter (Signed)
Follow up ° ° °Patient is returning a call. Please call. °

## 2017-07-25 NOTE — Telephone Encounter (Signed)
Left message for patient to call back  

## 2017-07-26 NOTE — Telephone Encounter (Signed)
We determined she needs to see urology with Dr. not sure does comment that she needs to have her heart looked at first and I cannot make the referral.  If they want to go on their own they are certainly welcome to try but I think it would delay her care needlessly

## 2017-07-26 NOTE — Telephone Encounter (Signed)
Pt has canceled cath and she and her family have requested a referral to neurosurgery .  I do not think her symptoms of dyspnea with exertion are a neurosurgical issue.   I think the dyspnea is related to either pulmonary HTN , COPD  or perhaps angina and have recommended a [facility] and [facility] heart  I would request that she get the referral from Dr. Redmond School if he thinks its appropriate.

## 2017-07-26 NOTE — Telephone Encounter (Signed)
Left detailed message for patient that Dr. Acie Fredrickson has asked PCP to make the referral to neurosurgery. I advised patient to call back with questions.

## 2017-08-01 ENCOUNTER — Ambulatory Visit (INDEPENDENT_AMBULATORY_CARE_PROVIDER_SITE_OTHER): Payer: Medicare Other

## 2017-08-01 ENCOUNTER — Other Ambulatory Visit: Payer: Medicare Other

## 2017-08-01 DIAGNOSIS — I4891 Unspecified atrial fibrillation: Secondary | ICD-10-CM

## 2017-08-01 DIAGNOSIS — L0889 Other specified local infections of the skin and subcutaneous tissue: Secondary | ICD-10-CM | POA: Diagnosis not present

## 2017-08-01 DIAGNOSIS — Z5181 Encounter for therapeutic drug level monitoring: Secondary | ICD-10-CM

## 2017-08-01 LAB — POCT INR: INR: 4

## 2017-08-01 NOTE — Patient Instructions (Signed)
Description   Skip today's dosage of Coumadin, then start taking 1/2 tablet daily except NO Coumadin on Thursdays.  Recheck INR 2 weeks. Call with any new or different & any procedures [phone].

## 2017-08-02 ENCOUNTER — Ambulatory Visit (HOSPITAL_COMMUNITY): Admission: RE | Admit: 2017-08-02 | Payer: Medicare Other | Source: Ambulatory Visit | Admitting: Internal Medicine

## 2017-08-02 ENCOUNTER — Encounter (HOSPITAL_COMMUNITY): Admission: RE | Payer: Self-pay | Source: Ambulatory Visit

## 2017-08-02 SURGERY — RIGHT/LEFT HEART CATH AND CORONARY ANGIOGRAPHY
Anesthesia: LOCAL

## 2017-08-13 ENCOUNTER — Telehealth: Payer: Self-pay | Admitting: Cardiovascular Disease

## 2017-08-13 NOTE — Telephone Encounter (Signed)
Original D/C received from [place]. Gave to [place] to see when Dr.Nahser can sign. If he will be the signing Physician.

## 2017-08-13 NOTE — Telephone Encounter (Signed)
Original D/C signed By Dr.Nahser faxed copy to [facility]. Their aware original ready for pick up.

## 2017-08-16 DIAGNOSIS — 419620001 Death: Secondary | SNOMED CT | POA: Diagnosis not present

## 2017-08-16 DEATH — deceased

## 2017-09-07 ENCOUNTER — Other Ambulatory Visit: Payer: Self-pay | Admitting: Cardiovascular Disease

## 2017-09-07 DIAGNOSIS — I482 Chronic atrial fibrillation, unspecified: Secondary | ICD-10-CM

## 2017-09-17 ENCOUNTER — Telehealth: Payer: Self-pay | Admitting: Family Medicine

## 2017-09-17 NOTE — Telephone Encounter (Signed)
Sympathy card sent 

## 2017-10-18 ENCOUNTER — Other Ambulatory Visit: Payer: Self-pay | Admitting: Cardiovascular Disease

## 2017-10-28 ENCOUNTER — Ambulatory Visit: Payer: Medicare Other | Admitting: Cardiovascular Disease
